# Patient Record
Sex: Female | Born: 1950 | Race: Black or African American | Hispanic: No | Marital: Married | Smoking: Never smoker
Health system: Southern US, Community
[De-identification: ages and names within clinical notes are randomized; demographics above are authoritative.]

## PROBLEM LIST (undated history)

## (undated) DIAGNOSIS — E785 Hyperlipidemia, unspecified: Secondary | ICD-10-CM

## (undated) DIAGNOSIS — Z9119 Patient's noncompliance with other medical treatment and regimen: Secondary | ICD-10-CM

## (undated) DIAGNOSIS — Z91199 Patient's noncompliance with other medical treatment and regimen due to unspecified reason: Secondary | ICD-10-CM

## (undated) DIAGNOSIS — I10 Essential (primary) hypertension: Secondary | ICD-10-CM

## (undated) DIAGNOSIS — B029 Zoster without complications: Secondary | ICD-10-CM

## (undated) HISTORY — PX: OTHER SURGICAL HISTORY: SHX169

## (undated) HISTORY — DX: Patient's noncompliance with other medical treatment and regimen: Z91.19

## (undated) HISTORY — DX: Essential (primary) hypertension: I10

## (undated) HISTORY — DX: Patient's noncompliance with other medical treatment and regimen due to unspecified reason: Z91.199

## (undated) HISTORY — DX: Hyperlipidemia, unspecified: E78.5

---

## 1998-02-02 ENCOUNTER — Ambulatory Visit (HOSPITAL_COMMUNITY): Admission: RE | Admit: 1998-02-02 | Discharge: 1998-02-02 | Payer: Self-pay | Admitting: Family Medicine

## 1999-07-25 ENCOUNTER — Encounter: Payer: Self-pay | Admitting: Family Medicine

## 1999-07-25 ENCOUNTER — Encounter: Admission: RE | Admit: 1999-07-25 | Discharge: 1999-07-25 | Payer: Self-pay | Admitting: Family Medicine

## 2000-07-27 ENCOUNTER — Encounter: Payer: Self-pay | Admitting: Internal Medicine

## 2000-07-27 ENCOUNTER — Encounter: Admission: RE | Admit: 2000-07-27 | Discharge: 2000-07-27 | Payer: Self-pay | Admitting: Internal Medicine

## 2000-08-03 ENCOUNTER — Other Ambulatory Visit: Admission: RE | Admit: 2000-08-03 | Discharge: 2000-08-03 | Payer: Self-pay | Admitting: Internal Medicine

## 2000-09-11 ENCOUNTER — Encounter: Payer: Self-pay | Admitting: Obstetrics and Gynecology

## 2000-09-14 ENCOUNTER — Encounter (INDEPENDENT_AMBULATORY_CARE_PROVIDER_SITE_OTHER): Payer: Self-pay

## 2000-09-14 ENCOUNTER — Ambulatory Visit (HOSPITAL_COMMUNITY): Admission: RE | Admit: 2000-09-14 | Discharge: 2000-09-14 | Payer: Self-pay | Admitting: Obstetrics and Gynecology

## 2001-09-14 ENCOUNTER — Encounter: Admission: RE | Admit: 2001-09-14 | Discharge: 2001-12-13 | Payer: Self-pay | Admitting: Internal Medicine

## 2002-12-07 ENCOUNTER — Other Ambulatory Visit: Admission: RE | Admit: 2002-12-07 | Discharge: 2002-12-07 | Payer: Self-pay | Admitting: Internal Medicine

## 2003-02-21 ENCOUNTER — Encounter: Admission: RE | Admit: 2003-02-21 | Discharge: 2003-02-21 | Payer: Self-pay | Admitting: Internal Medicine

## 2003-02-21 ENCOUNTER — Encounter (INDEPENDENT_AMBULATORY_CARE_PROVIDER_SITE_OTHER): Payer: Self-pay | Admitting: *Deleted

## 2003-11-30 ENCOUNTER — Ambulatory Visit: Payer: Self-pay | Admitting: Internal Medicine

## 2003-12-06 ENCOUNTER — Ambulatory Visit: Payer: Self-pay | Admitting: Internal Medicine

## 2004-01-11 ENCOUNTER — Ambulatory Visit: Payer: Self-pay | Admitting: Internal Medicine

## 2004-07-18 ENCOUNTER — Ambulatory Visit: Payer: Self-pay | Admitting: Internal Medicine

## 2004-12-20 ENCOUNTER — Ambulatory Visit: Payer: Self-pay | Admitting: Internal Medicine

## 2004-12-30 ENCOUNTER — Other Ambulatory Visit: Admission: RE | Admit: 2004-12-30 | Discharge: 2004-12-30 | Payer: Self-pay | Admitting: Internal Medicine

## 2004-12-30 ENCOUNTER — Ambulatory Visit: Payer: Self-pay | Admitting: Internal Medicine

## 2005-03-31 ENCOUNTER — Ambulatory Visit: Payer: Self-pay | Admitting: Internal Medicine

## 2005-06-18 ENCOUNTER — Ambulatory Visit: Payer: Self-pay | Admitting: Internal Medicine

## 2005-06-25 ENCOUNTER — Ambulatory Visit: Payer: Self-pay | Admitting: Internal Medicine

## 2005-08-06 ENCOUNTER — Ambulatory Visit: Payer: Self-pay | Admitting: Internal Medicine

## 2005-10-01 ENCOUNTER — Ambulatory Visit: Payer: Self-pay | Admitting: Internal Medicine

## 2005-11-13 ENCOUNTER — Ambulatory Visit: Payer: Self-pay | Admitting: Internal Medicine

## 2005-11-13 LAB — CONVERTED CEMR LAB
ALT: 36 units/L (ref 0–40)
AST: 31 units/L (ref 0–37)
Albumin: 3.5 g/dL (ref 3.5–5.2)
Alkaline Phosphatase: 90 units/L (ref 39–117)
Bilirubin, Direct: 0.1 mg/dL (ref 0.0–0.3)
Hgb A1c MFr Bld: 7.3 % — ABNORMAL HIGH (ref 4.6–6.0)
Total Bilirubin: 0.7 mg/dL (ref 0.3–1.2)
Total Protein: 6.9 g/dL (ref 6.0–8.3)

## 2006-02-12 ENCOUNTER — Ambulatory Visit: Payer: Self-pay | Admitting: Internal Medicine

## 2006-02-12 LAB — CONVERTED CEMR LAB
BUN: 18 mg/dL (ref 6–23)
Cholesterol: 196 mg/dL (ref 0–200)
Creatinine, Ser: 1 mg/dL (ref 0.4–1.2)
HDL: 33.9 mg/dL — ABNORMAL LOW (ref >39.0)
Hgb A1c MFr Bld: 7.6 % — ABNORMAL HIGH (ref 4.6–6.0)
LDL Cholesterol: 147 mg/dL — ABNORMAL HIGH (ref 0–99)
Potassium: 4 meq/L (ref 3.5–5.1)
Sodium: 137 meq/L (ref 135–145)
TSH: 0.99 microintl units/mL (ref 0.35–5.50)
Total CHOL/HDL Ratio: 5.8
Triglycerides: 78 mg/dL (ref 0–149)
VLDL: 16 mg/dL (ref 0–40)

## 2006-04-16 ENCOUNTER — Ambulatory Visit: Payer: Self-pay | Admitting: Internal Medicine

## 2006-06-19 ENCOUNTER — Ambulatory Visit: Payer: Self-pay | Admitting: Internal Medicine

## 2006-06-19 LAB — CONVERTED CEMR LAB
Cholesterol: 179 mg/dL (ref 0–200)
HDL: 37 mg/dL — ABNORMAL LOW (ref >39.0)
VLDL: 14 mg/dL (ref 0–40)

## 2006-07-01 ENCOUNTER — Ambulatory Visit: Payer: Self-pay | Admitting: Internal Medicine

## 2006-09-01 DIAGNOSIS — I10 Essential (primary) hypertension: Secondary | ICD-10-CM | POA: Insufficient documentation

## 2006-09-04 ENCOUNTER — Ambulatory Visit: Payer: Self-pay | Admitting: Internal Medicine

## 2006-09-04 DIAGNOSIS — E1169 Type 2 diabetes mellitus with other specified complication: Secondary | ICD-10-CM

## 2006-09-04 DIAGNOSIS — E785 Hyperlipidemia, unspecified: Secondary | ICD-10-CM | POA: Insufficient documentation

## 2006-09-04 DIAGNOSIS — E1165 Type 2 diabetes mellitus with hyperglycemia: Secondary | ICD-10-CM

## 2006-11-20 ENCOUNTER — Ambulatory Visit: Payer: Self-pay | Admitting: Internal Medicine

## 2006-12-11 ENCOUNTER — Telehealth: Payer: Self-pay | Admitting: Internal Medicine

## 2007-01-19 ENCOUNTER — Ambulatory Visit: Payer: Self-pay | Admitting: Internal Medicine

## 2007-01-19 LAB — CONVERTED CEMR LAB: Hgb A1c MFr Bld: 7.4 % — ABNORMAL HIGH (ref 4.6–6.0)

## 2007-02-18 ENCOUNTER — Telehealth: Payer: Self-pay | Admitting: Internal Medicine

## 2007-05-12 ENCOUNTER — Ambulatory Visit: Payer: Self-pay | Admitting: Internal Medicine

## 2007-07-05 ENCOUNTER — Ambulatory Visit: Payer: Self-pay | Admitting: Internal Medicine

## 2007-07-05 LAB — CONVERTED CEMR LAB: Hgb A1c MFr Bld: 10.2 % — ABNORMAL HIGH (ref 4.6–6.0)

## 2007-09-13 ENCOUNTER — Ambulatory Visit: Payer: Self-pay | Admitting: Internal Medicine

## 2007-11-15 ENCOUNTER — Ambulatory Visit: Payer: Self-pay | Admitting: Internal Medicine

## 2008-01-31 ENCOUNTER — Ambulatory Visit: Payer: Self-pay | Admitting: Internal Medicine

## 2008-01-31 ENCOUNTER — Telehealth: Payer: Self-pay | Admitting: Internal Medicine

## 2008-01-31 LAB — CONVERTED CEMR LAB
Chloride: 97 meq/L (ref 96–112)
Creatinine, Ser: 0.9 mg/dL (ref 0.4–1.2)
Creatinine,U: 62.3 mg/dL
GFR calc non Af Amer: 68 mL/min
Hgb A1c MFr Bld: 10.8 % — ABNORMAL HIGH (ref 4.6–6.0)

## 2008-04-24 ENCOUNTER — Ambulatory Visit: Payer: Self-pay | Admitting: Internal Medicine

## 2008-04-24 DIAGNOSIS — IMO0002 Reserved for concepts with insufficient information to code with codable children: Secondary | ICD-10-CM

## 2008-04-24 LAB — CONVERTED CEMR LAB
BUN: 18 mg/dL (ref 6–23)
CO2: 30 meq/L (ref 19–32)
Chloride: 101 meq/L (ref 96–112)
Creatinine, Ser: 1.2 mg/dL (ref 0.4–1.2)

## 2008-07-04 ENCOUNTER — Ambulatory Visit: Payer: Self-pay | Admitting: Internal Medicine

## 2008-09-07 ENCOUNTER — Telehealth (INDEPENDENT_AMBULATORY_CARE_PROVIDER_SITE_OTHER): Payer: Self-pay | Admitting: *Deleted

## 2008-09-12 ENCOUNTER — Ambulatory Visit: Payer: Self-pay | Admitting: Internal Medicine

## 2008-09-12 LAB — CONVERTED CEMR LAB
BUN: 21 mg/dL (ref 6–23)
Basophils Absolute: 0 10*3/uL (ref 0.0–0.1)
Calcium: 9.6 mg/dL (ref 8.4–10.5)
Creatinine, Ser: 1 mg/dL (ref 0.4–1.2)
Eosinophils Relative: 3.3 % (ref 0.0–5.0)
GFR calc non Af Amer: 72.85 mL/min (ref >60)
Glucose, Bld: 275 mg/dL — ABNORMAL HIGH (ref 70–99)
Hemoglobin: 12.8 g/dL (ref 12.0–15.0)
Lymphocytes Relative: 17.2 % (ref 12.0–46.0)
Lymphs Abs: 1.5 10*3/uL (ref 0.7–4.0)
MCV: 90.6 fL (ref 78.0–100.0)
Monocytes Relative: 5.4 % (ref 3.0–12.0)
Neutrophils Relative %: 73.7 % (ref 43.0–77.0)
Platelets: 286 10*3/uL (ref 150.0–400.0)
RBC: 4.2 M/uL (ref 3.87–5.11)
RDW: 13 % (ref 11.5–14.6)
Sodium: 137 meq/L (ref 135–145)

## 2008-10-09 ENCOUNTER — Telehealth: Payer: Self-pay | Admitting: Internal Medicine

## 2008-12-12 ENCOUNTER — Ambulatory Visit: Payer: Self-pay | Admitting: Internal Medicine

## 2009-02-27 ENCOUNTER — Ambulatory Visit: Payer: Self-pay | Admitting: Internal Medicine

## 2009-03-22 ENCOUNTER — Telehealth: Payer: Self-pay | Admitting: Internal Medicine

## 2009-05-16 ENCOUNTER — Ambulatory Visit: Payer: Self-pay | Admitting: Internal Medicine

## 2009-08-22 ENCOUNTER — Ambulatory Visit: Payer: Self-pay | Admitting: Internal Medicine

## 2009-12-31 ENCOUNTER — Ambulatory Visit: Payer: Self-pay | Admitting: Internal Medicine

## 2009-12-31 DIAGNOSIS — F3289 Other specified depressive episodes: Secondary | ICD-10-CM | POA: Insufficient documentation

## 2010-02-19 NOTE — Progress Notes (Signed)
Summary: Pt req samples of the new insulin or req script to [facility]  Phone Note Call from Patient Call back at [facility] Phone [phone]   Caller: Patient Complaint: Urinary/GYN Problems Summary of Call: Pt called and is req samples of the new insulin that Dr. Lovell Sheehan just prescribed or call in script to [facility]  [address] and [address].  Initial call taken by: Lucy Antigua,  March 22, 2009 11:55 AM  Follow-up for Phone Call        left message on machine 1 vial in [place]- pt to ask for debbie Follow-up by: Willy Eddy, LPN,  March 22, 2009 12:18 PM

## 2010-02-19 NOTE — Assessment & Plan Note (Signed)
Summary: 3 month fup//ccm   Vital Signs:  Patient profile:   60 year old female Height:      66 inches Weight:      172 pounds BMI:     27.86 Temp:     98.2 degrees F oral Pulse rate:   84 / minute Resp:     14 per minute BP sitting:   164 / 94  (left arm)  Vitals Entered By: Willy Eddy, LPN (August 22, 2009 11:38 AM) CC: roa- an d states s he ios taking meds as ordered, Hypertension Management Is Patient Diabetic? Yes Did you bring your meter with you today? No   CC:  roa- an d states s he ios taking meds as ordered and Hypertension Management.  History of Present Illness: Eating salt containg food with her grandkids such as chips!  Hypertension History:      She complains of dyspnea with exertion, but denies headache, chest pain, palpitations, orthopnea, PND, peripheral edema, visual symptoms, neurologic problems, syncope, and side effects from treatment.  mild.        Positive major cardiovascular risk factors include female age 85 years old or older, diabetes, hyperlipidemia, and hypertension.  Negative major cardiovascular risk factors include negative family history for ischemic heart disease and non-tobacco-user status.     Preventive Screening-Counseling & Management  Alcohol-Tobacco     Smoking Status: never     Passive Smoke Exposure: no  Problems Prior to Update: 1)  Cellulitis and Abscess of Upper Arm and Forearm  (ICD-682.3) 2)  Thoracic/lumbosacral Neuritis/radiculitis Unspec  (ICD-724.4) 3)  Hyperlipidemia  (ICD-272.4) 4)  Dm W/manifestation Nec, Type II, Uncontrolled  (ICD-250.82) 5)  Hypertension  (ICD-401.9) 6)  Family History Diabetes 1st Degree Relative  (ICD-V18.0)  Current Problems (verified): 1)  Cellulitis and Abscess of Upper Arm and Forearm  (ICD-682.3) 2)  Thoracic/lumbosacral Neuritis/radiculitis Unspec  (ICD-724.4) 3)  Hyperlipidemia  (ICD-272.4) 4)  Dm W/manifestation Nec, Type II, Uncontrolled  (ICD-250.82) 5)  Hypertension   (ICD-401.9) 6)  Family History Diabetes 1st Degree Relative  (ICD-V18.0)  Medications Prior to Update: 1)  Lantus Solostar 100 Unit/ml Soln (Insulin Glargine) .... 40 Units Once Daily 2)  Janumet 50-500 Mg Tabs (Sitagliptin-Metformin Hcl) .... One By Mouth Two Times A Day 3)  Mupirocin 2 % Oint (Mupirocin) .... Apply To Rash Bid 4)  Azor 5-40 Mg Tabs (Amlodipine-Olmesartan)  Current Medications (verified): 1)  Lantus Solostar 100 Unit/ml Soln (Insulin Glargine) .... 40 Units Once Daily 2)  Janumet 50-500 Mg Tabs (Sitagliptin-Metformin Hcl) .... One By Mouth Two Times A Day 3)  Mupirocin 2 % Oint (Mupirocin) .... Apply To Rash Bid 4)  Azor 5-40 Mg Tabs (Amlodipine-Olmesartan)  Allergies (verified): 1)  ! Codeine  Past History:  Family History: Last updated: 09/01/2006 Family History Diabetes 1st degree relative Family History Hypertension Family History Other cancer-Breast,colon  Social History: Last updated: 09/01/2006 Never Smoked Alcohol use-yes Drug use-no  Risk Factors: Exercise: yes (01/19/2007)  Risk Factors: Smoking Status: never (08/22/2009) Passive Smoke Exposure: no (08/22/2009)  Past medical, surgical, family and social histories (including risk factors) reviewed, and no changes noted (except as noted below).  Past Medical History: Reviewed history from 09/13/2007 and no changes required. Hypertension Diabetes mellitus, type II Hyperlipidemia medical noncompliance  Past Surgical History: Reviewed history from 09/01/2006 and no changes required. G4 P4  Family History: Reviewed history from 09/01/2006 and no changes required. Family History Diabetes 1st degree relative Family History Hypertension Family History Other  cancer-Breast,colon  Social History: Reviewed history from 09/01/2006 and no changes required. Never Smoked Alcohol use-yes Drug use-no  Review of Systems  The patient denies anorexia, fever, weight loss, weight gain, vision  loss, decreased hearing, hoarseness, chest pain, syncope, dyspnea on exertion, peripheral edema, prolonged cough, headaches, hemoptysis, abdominal pain, melena, hematochezia, severe indigestion/heartburn, hematuria, incontinence, genital sores, muscle weakness, suspicious skin lesions, transient blindness, difficulty walking, depression, unusual weight change, abnormal bleeding, enlarged lymph nodes, angioedema, and breast masses.    Physical Exam  General:  overweight-appearing.  alert.   Head:  atraumatic, no abnormalities observed, and diffuse alopecia.   Eyes:  pupils equal and pupils round.   Neck:  No deformities, masses, or tenderness noted. Lungs:  Normal respiratory effort, chest expands symmetrically. Lungs are clear to auscultation, no crackles or wheezes. Heart:  Normal rate and regular rhythm. S1 and S2 normal without gallop, murmur, click, rub or other extra sounds. Abdomen:  soft and non-tender.     Impression & Recommendations:  Problem # 1:  DM W/MANIFESTATION NEC, TYPE II, UNCONTROLLED (ICD-250.82) Assessment Deteriorated  Her updated medication list for this problem includes:    Lantus Solostar 100 Unit/ml Soln (Insulin glargine) .Marland KitchenMarland KitchenMarland KitchenMarland Kitchen 40 units once daily    Janumet 50-500 Mg Tabs (Sitagliptin-metformin hcl) ..... One by mouth two times a day    Azor 5-40 Mg Tabs (Amlodipine-olmesartan)  Labs Reviewed: Creat: 1.0 (09/12/2008)    Reviewed HgBA1c results: 12.3 (09/12/2008)  11.3 (04/24/2008)  Problem # 2:  HYPERTENSION (ICD-401.9) Assessment: Deteriorated  pt needs diet counsilling and salt restrictions Her updated medication list for this problem includes:    Azor 5-40 Mg Tabs (Amlodipine-olmesartan)  BP today: 164/94 Prior BP: 142/88 (05/16/2009)  10 Yr Risk Heart Disease: > 32 % Prior 10 Yr Risk Heart Disease: 27 % (04/24/2008)  Labs Reviewed: K+: 4.3 (09/12/2008) Creat: : 1.0 (09/12/2008)   Chol: 229 (09/12/2008)   HDL: 35.70 (09/12/2008)   LDL: 128  (06/19/2006)   TG: 70 (06/19/2006)  Complete Medication List: 1)  Lantus Solostar 100 Unit/ml Soln (Insulin glargine) .... 40 units once daily 2)  Janumet 50-500 Mg Tabs (Sitagliptin-metformin hcl) .... One by mouth two times a day 3)  Mupirocin 2 % Oint (Mupirocin) .... Apply to rash bid 4)  Azor 5-40 Mg Tabs (Amlodipine-olmesartan)  Hypertension Assessment/Plan:      The patient's hypertensive risk group is category C: Target organ damage and/or diabetes.  Her calculated 10 year risk of coronary heart disease is > 32 %.  Today's blood pressure is 164/94.  Her blood pressure goal is < 130/80.  Patient Instructions: 1)  Please schedule a follow-up appointment in 4 months.

## 2010-02-19 NOTE — Assessment & Plan Note (Signed)
Summary: 2 month rov/njr---PT [facility] // RS   Vital Signs:  Patient profile:   60 year old female Height:      66 inches Weight:      170 pounds BMI:     27.54 Temp:     98.2 degrees F oral Pulse rate:   84 / minute Resp:     14 per minute BP sitting:   154 / 84  (left arm)  Vitals Entered By: Willy Eddy, LPN (February 27, 2009 2:21 PM) CC: roa Is Patient Diabetic? Yes Did you bring your meter with you today? No Pain Assessment Patient in pain? no        CC:  roa.  History of Present Illness: we need to consider cost in finding  insulin for this pt to control her sugars will try to get the pt to understand this changes by carefully listing her medications we will change to the 4 $ generics for orals and change to the 70/30 nph to control the blood sugars the last a1c was 12! and the pt has not been able to be compliant with the lantus due to costs  Preventive Screening-Counseling & Management  Alcohol-Tobacco     Smoking Status: never     Passive Smoke Exposure: no  Problems Prior to Update: 1)  Cellulitis and Abscess of Upper Arm and Forearm  (ICD-682.3) 2)  Thoracic/lumbosacral Neuritis/radiculitis Unspec  (ICD-724.4) 3)  Hyperlipidemia  (ICD-272.4) 4)  Dm W/manifestation Nec, Type II, Uncontrolled  (ICD-250.82) 5)  Hypertension  (ICD-401.9) 6)  Family History Diabetes 1st Degree Relative  (ICD-V18.0)  Current Problems (verified): 1)  Cellulitis and Abscess of Upper Arm and Forearm  (ICD-682.3) 2)  Thoracic/lumbosacral Neuritis/radiculitis Unspec  (ICD-724.4) 3)  Hyperlipidemia  (ICD-272.4) 4)  Dm W/manifestation Nec, Type II, Uncontrolled  (ICD-250.82) 5)  Hypertension  (ICD-401.9) 6)  Family History Diabetes 1st Degree Relative  (ICD-V18.0)  Medications Prior to Update: 1)  Lantus 100 Unit/ml  Soln (Insulin Glargine) .... 45 Units Per Day 2)  Janumet 50-500 Mg Tabs (Sitagliptin-Metformin Hcl) .... One By Mouth Two Times A Day 3)  Keflex 500 Mg Caps  (Cephalexin) .... One Tablet Three Times A Day 4)  Mupirocin 2 % Oint (Mupirocin) .... Apply To Rash Bid 5)  Diflucan 150 Mg Tabs (Fluconazole) .... Take One By Mouth Now and Again in 5 Days 6)  Azor 5-40 Mg Tabs (Amlodipine-Olmesartan)  Current Medications (verified): 1)  Novolog Mix 70/30 70-30 % Susp (Insulin Aspart Prot & Aspart) .... 20 Units At Breakfast and 20 Units At  Dinner  ( Twice A Day) 2)  Janumet 50-500 Mg Tabs (Sitagliptin-Metformin Hcl) .... One By Mouth Two Times A Day 3)  Mupirocin 2 % Oint (Mupirocin) .... Apply To Rash Bid 4)  Azor 5-40 Mg Tabs (Amlodipine-Olmesartan)  Allergies (verified): 1)  ! Codeine  Past History:  Family History: Last updated: 09/01/2006 Family History Diabetes 1st degree relative Family History Hypertension Family History Other cancer-Breast,colon  Social History: Last updated: 09/01/2006 Never Smoked Alcohol use-yes Drug use-no  Risk Factors: Exercise: yes (01/19/2007)  Risk Factors: Smoking Status: never (02/27/2009) Passive Smoke Exposure: no (02/27/2009)  Past medical, surgical, family and social histories (including risk factors) reviewed, and no changes noted (except as noted below).  Past Medical History: Reviewed history from 09/13/2007 and no changes required. Hypertension Diabetes mellitus, type II Hyperlipidemia medical noncompliance  Past Surgical History: Reviewed history from 09/01/2006 and no changes required. G4 P4  Family History: Reviewed  history from 09/01/2006 and no changes required. Family History Diabetes 1st degree relative Family History Hypertension Family History Other cancer-Breast,colon  Social History: Reviewed history from 09/01/2006 and no changes required. Never Smoked Alcohol use-yes Drug use-no  Review of Systems  The patient denies anorexia, fever, weight loss, weight gain, vision loss, decreased hearing, hoarseness, chest pain, syncope, dyspnea on exertion, peripheral edema,  prolonged cough, headaches, hemoptysis, abdominal pain, melena, hematochezia, severe indigestion/heartburn, hematuria, incontinence, genital sores, muscle weakness, suspicious skin lesions, transient blindness, difficulty walking, depression, unusual weight change, abnormal bleeding, enlarged lymph nodes, angioedema, and breast masses.    Physical Exam  General:  overweight-appearing.  alert.   Head:  atraumatic, no abnormalities observed, and diffuse alopecia.   Eyes:  pupils equal and pupils round.   Nose:  nose piercing noted and no external erythema.   Neck:  No deformities, masses, or tenderness noted. Lungs:  Normal respiratory effort, chest expands symmetrically. Lungs are clear to auscultation, no crackles or wheezes. Heart:  Normal rate and regular rhythm. S1 and S2 normal without gallop, murmur, click, rub or other extra sounds. Abdomen:  soft and non-tender.   Msk:  skin under left arm with pustular leson and erythema Extremities:  No clubbing, cyanosis, edema, or deformity noted with normal full range of motion of all joints.   Neurologic:  alert & oriented X3 and gait normal.     Impression & Recommendations:  Problem # 1:  DM W/MANIFESTATION NEC, TYPE II, UNCONTROLLED (ICD-250.82) Assessment Unchanged very complicated pt due to her learning ability and to her financial situation we have simplifried the insulin to 20 units 70/30 two times a day and will moniter for 2 months if she can get her a1c down we wil create a protocol for the 70/30 we have asked her daugter to come to the next office visit as a second set of eyes and ears Her updated medication list for this problem includes:    Novolog Mix 70/30 70-30 % Susp (Insulin aspart prot & aspart) .Marland Kitchen... 20 units at breakfast and 20 units at  dinner  ( twice a day)    Janumet 50-500 Mg Tabs (Sitagliptin-metformin hcl) ..... One by mouth two times a day    Azor 5-40 Mg Tabs (Amlodipine-olmesartan)  Labs Reviewed: Creat: 1.0  (09/12/2008)    Reviewed HgBA1c results: 12.3 (09/12/2008)  11.3 (04/24/2008)  Problem # 2:  HYPERTENSION (ICD-401.9) Assessment: Unchanged stable Her updated medication list for this problem includes:    Azor 5-40 Mg Tabs (Amlodipine-olmesartan)  BP today: 154/84 Prior BP: 150/90 (12/12/2008)  Prior 10 Yr Risk Heart Disease: 27 % (04/24/2008)  Labs Reviewed: K+: 4.3 (09/12/2008) Creat: : 1.0 (09/12/2008)   Chol: 229 (09/12/2008)   HDL: 35.70 (09/12/2008)   LDL: 128 (06/19/2006)   TG: 70 (06/19/2006)  Complete Medication List: 1)  Novolog Mix 70/30 70-30 % Susp (Insulin aspart prot & aspart) .... 20 units at breakfast and 20 units at  dinner  ( twice a day) 2)  Janumet 50-500 Mg Tabs (Sitagliptin-metformin hcl) .... One by mouth two times a day 3)  Mupirocin 2 % Oint (Mupirocin) .... Apply to rash bid 4)  Azor 5-40 Mg Tabs (Amlodipine-olmesartan)  Patient Instructions: 1)  Please schedule a follow-up appointment in 2 months. 2)  have your daughter come to the next OV

## 2010-02-19 NOTE — Assessment & Plan Note (Signed)
Summary: 2 month rov/njr----PT [facility] // RS   Vital Signs:  Patient profile:   60 year old female Height:      66 inches Weight:      178 pounds BMI:     28.83 Temp:     98.2 degrees F oral Pulse rate:   80 / minute Resp:     14 per minute BP sitting:   142 / 88  (left arm)  Vitals Entered By: Willy Eddy, LPN (May 16, 2009 10:29 AM) CC: roa- states she ran out of novolog and was given lantus and has been taking lantus 40 units qd   CC:  roa- states she ran out of novolog and was given lantus and has been taking lantus 40 units qd.  History of Present Illness: the pt was on novolog due to cost and gained weigth and stated that she wanted to eat all the time she cannot afford her medicaitons and neds help with understanding her options for care we have tried to give her sufficisnt samples but event then I am concerned that she "holds back" worried about running out  Allergies: 1)  ! Codeine  Review of Systems  The patient denies anorexia, fever, weight loss, weight gain, vision loss, decreased hearing, hoarseness, chest pain, syncope, dyspnea on exertion, peripheral edema, prolonged cough, headaches, hemoptysis, abdominal pain, melena, hematochezia, severe indigestion/heartburn, hematuria, incontinence, genital sores, muscle weakness, suspicious skin lesions, transient blindness, difficulty walking, depression, unusual weight change, abnormal bleeding, enlarged lymph nodes, angioedema, and breast masses.    Physical Exam  General:  overweight-appearing.  alert.   Head:  atraumatic, no abnormalities observed, and diffuse alopecia.   Neck:  No deformities, masses, or tenderness noted. Lungs:  Normal respiratory effort, chest expands symmetrically. Lungs are clear to auscultation, no crackles or wheezes. Heart:  Normal rate and regular rhythm. S1 and S2 normal without gallop, murmur, click, rub or other extra sounds.   Impression & Recommendations:  Problem # 1:  DM  W/MANIFESTATION NEC, TYPE II, UNCONTROLLED (ICD-250.82) the pt has been out of her lantus and this is represented in the markedly elevated  access  Her updated medication list for this problem includes:    Lantus Solostar 100 Unit/ml Soln (Insulin glargine) .Marland KitchenMarland KitchenMarland KitchenMarland Kitchen 40 units once daily    Janumet 50-500 Mg Tabs (Sitagliptin-metformin hcl) ..... One by mouth two times a day    Azor 5-40 Mg Tabs (Amlodipine-olmesartan)  Labs Reviewed: Creat: 1.0 (09/12/2008)    Reviewed HgBA1c results: 12.3 (09/12/2008)  11.3 (04/24/2008)  Problem # 2:  HYPERTENSION (ICD-401.9)  Her updated medication list for this problem includes:    Azor 5-40 Mg Tabs (Amlodipine-olmesartan)  BP today: 142/88 Prior BP: 154/84 (02/27/2009)  Prior 10 Yr Risk Heart Disease: 27 % (04/24/2008)  Labs Reviewed: K+: 4.3 (09/12/2008) Creat: : 1.0 (09/12/2008)   Chol: 229 (09/12/2008)   HDL: 35.70 (09/12/2008)   LDL: 128 (06/19/2006)   TG: 70 (06/19/2006)  Problem # 3:  Preventive Health Care (ICD-V70.0)  she needs to be inrolled in a pulbic progream but [facility] resistant [facility] but has [facility]  Complete Medication List: 1)  Lantus Solostar 100 Unit/ml Soln (Insulin glargine) .... 40 units once daily 2)  Janumet 50-500 Mg Tabs (Sitagliptin-metformin hcl) .... One by mouth two times a day 3)  Mupirocin 2 % Oint (Mupirocin) .... Apply to rash bid 4)  Azor 5-40 Mg Tabs (Amlodipine-olmesartan)  Patient Instructions: 1)  call social services and discuss medicaid  for you 2)  Please schedule a follow-up appointment in 3 months. 3)  take medications evely day including the lantus shots

## 2010-02-21 NOTE — Assessment & Plan Note (Signed)
Summary: 4 month rov/njr   Vital Signs:  Patient profile:   60 year old female Height:      66 inches Weight:      172 pounds BMI:     27.86 Temp:     98.2 degrees F oral Pulse rate:   80 / minute Resp:     14 per minute BP sitting:   160 / 90  (left arm)  Vitals Entered By: Willy Eddy, LPN (December 31, 2009 12:09 PM) CC: roa- when asked if she had been taking meds,she stated she doesnt always have it and she takes what she has Is Patient Diabetic? Yes   Primary Care Kathryn Cosby:  Stacie Glaze MD  CC:  roa- when asked if she had been taking meds and she stated she doesnt always have it and she takes what she has.  History of Present Illness: The pt has been missing her insulin doses she did not pick up her DM meds she feels like she is ready to go.... 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 40 Units Once Daily 2)   Janumet 50-500 Mg Tabs (Sitagliptin-Metformin Hcl) .... One By Mouth Two Times A Day 3)  Mupirocin 2 % Oint (Mupirocin) .... Apply To Rash Bid 4)  Azor 5-40 Mg Tabs (Amlodipine-Olmesartan)  Current Medications (verified): 1)  Lantus Solostar 100 Unit/ml Soln (Insulin Glargine) .... 40 Units Once Daily 2)  Janumet 50-500 Mg Tabs (Sitagliptin-Metformin Hcl) .... One By Mouth Two Times A Day 3)  Mupirocin 2 % Oint (Mupirocin) .... Apply To Rash Bid 4)  Azor 5-40 Mg Tabs (Amlodipine-Olmesartan) 5)  Cymbalta 30 Mg Cpep (Duloxetine Hcl) .... One By Mouth Daily in The Morning  Allergies (verified): 1)  ! Codeine  Past History:  Family History: Last updated: 09/01/2006 Family History Diabetes 1st degree relative Family History Hypertension Family History Other cancer-Breast,colon  Social History: Last updated: 09/01/2006 Never Smoked Alcohol use-yes Drug use-no  Risk Factors: Exercise: yes (01/19/2007)  Risk Factors: Smoking Status: never (12/31/2009) Passive Smoke Exposure: no (12/31/2009)  Past medical, surgical, family and social histories (including risk factors) reviewed, and no changes noted (except as noted below).  Past Medical History: Reviewed history from 09/13/2007 and no changes required. Hypertension Diabetes mellitus, type II Hyperlipidemia medical noncompliance  Past Surgical History: Reviewed history from 09/01/2006 and no changes required. G4 P4  Family History: Reviewed history from 09/01/2006 and no changes required. Family History Diabetes 1st degree relative Family History Hypertension Family History Other cancer-Breast,colon  Social History: Reviewed history  from 09/01/2006 and no changes required. Never Smoked Alcohol use-yes Drug use-no  Review of Systems       The patient complains of depression.  The patient denies anorexia, fever, weight loss, weight gain, vision loss, decreased hearing, hoarseness, chest pain, syncope, dyspnea on  exertion, peripheral edema, prolonged cough, headaches, hemoptysis, abdominal pain, melena, hematochezia, severe indigestion/heartburn, hematuria, incontinence, genital sores, muscle weakness, suspicious skin lesions, transient blindness, difficulty walking, unusual weight change, abnormal bleeding, enlarged lymph nodes, angioedema, and breast masses.    Physical Exam  General:  overweight-appearing.  alert.   Head:  atraumatic, no abnormalities observed, and diffuse alopecia.   Eyes:  pupils equal and pupils round.   Neck:  No deformities, masses, or tenderness noted. Lungs:  Normal respiratory effort, chest expands symmetrically. Lungs are clear to auscultation, no crackles or wheezes. Heart:  Normal rate and regular rhythm. S1 and S2 normal without gallop, murmur, click, rub or other extra sounds.  Diabetes Management Exam:    Foot Exam (with socks and/or shoes not present):       Sensory-Monofilament:          Left foot: diminished          Right foot: diminished       Inspection:          Left foot: normal          Right foot: normal   Impression & Recommendations:  Problem # 1:  DM W/MANIFESTATION NEC, TYPE II, UNCONTROLLED (ICD-250.82) Assessment Unchanged non compliance with medications, forgetting dose very depressed Her updated medication list for this problem includes:    Lantus Solostar 100 Unit/ml Soln (Insulin glargine) .Marland KitchenMarland KitchenMarland KitchenMarland Kitchen 40 units once daily    Janumet 50-500 Mg Tabs (Sitagliptin-metformin hcl) ..... One by mouth two times a day    Azor 5-40 Mg Tabs (Amlodipine-olmesartan)  Labs Reviewed: Creat: 1.0 (09/12/2008)    Reviewed HgBA1c results: 12.3 (09/12/2008)  11.3 (04/24/2008)  Problem # 2:  ATYPICAL DEPRESSIVE DISORDER (ICD-296.82) Assessment: Unchanged with non compliance with medications very presure speech samples of cymbalta and [facility]  Problem # 3:  HYPERTENSION (ICD-401.9)  Her updated medication list for this problem  includes:    Azor 5-40 Mg Tabs (Amlodipine-olmesartan)  BP today: 160/90 Prior BP: 164/94 (08/22/2009)  Prior 10 Yr Risk Heart Disease: > 32 % (08/22/2009)  Labs Reviewed: K+: 4.3 (09/12/2008) Creat: : 1.0 (09/12/2008)   Chol: 229 (09/12/2008)   HDL: 35.70 (09/12/2008)   LDL: 128 (06/19/2006)   TG: 70 (06/19/2006)  Complete Medication List: 1)  Lantus Solostar 100 Unit/ml Soln (Insulin glargine) .... 40 units once daily 2)  Janumet 50-500 Mg Tabs (Sitagliptin-metformin hcl) .... One by mouth two times a day 3)  Mupirocin 2 % Oint (Mupirocin) .... Apply to rash bid 4)  Azor 5-40 Mg Tabs (Amlodipine-olmesartan) 5)  Cymbalta 30 Mg Cpep (Duloxetine hcl) .... One by mouth daily in the morning  Patient Instructions: 1)  Please schedule a follow-up appointment in 3 months.   Orders Added: 1)  Est. Patient Level II [38101]

## 2010-04-01 ENCOUNTER — Encounter: Payer: Self-pay | Admitting: Internal Medicine

## 2010-04-01 ENCOUNTER — Ambulatory Visit: Payer: Self-pay | Admitting: Internal Medicine

## 2010-04-01 DIAGNOSIS — I10 Essential (primary) hypertension: Secondary | ICD-10-CM

## 2010-04-01 DIAGNOSIS — E1165 Type 2 diabetes mellitus with hyperglycemia: Secondary | ICD-10-CM

## 2010-04-01 DIAGNOSIS — E1169 Type 2 diabetes mellitus with other specified complication: Secondary | ICD-10-CM

## 2010-04-01 DIAGNOSIS — IMO0002 Reserved for concepts with insufficient information to code with codable children: Secondary | ICD-10-CM

## 2010-04-01 LAB — BASIC METABOLIC PANEL
BUN: 16 mg/dL (ref 6–23)
CO2: 26 mEq/L (ref 19–32)
Calcium: 9.1 mg/dL (ref 8.4–10.5)
Glucose, Bld: 360 mg/dL — ABNORMAL HIGH (ref 70–99)
Sodium: 131 mEq/L — ABNORMAL LOW (ref 135–145)

## 2010-04-01 NOTE — Patient Instructions (Signed)
samples of your Lantus and her oral medications were given to her. we're going to try to room help you to enroll in a drug assistance program through the pharmaceutical companies but still encourage you to consider [facility]

## 2010-04-01 NOTE — Progress Notes (Signed)
  Subjective:    Patient ID: [name], female    DOB: [birth year], 60 y.o.   MRN: [id number]  HPI The pt has been out of insulin and has been off medications She  lacks income and insurance and will not go to [facility]. We will try to get her enrolled in a program for lantus She does not monitor her CBG's     Review of Systems  Constitutional: Negative for activity change, appetite change and fatigue.  HENT: Negative for ear pain, congestion, neck pain, postnasal drip and sinus pressure.   Eyes: Negative for redness and visual disturbance.  Respiratory: Negative for cough, shortness of breath and wheezing.   Gastrointestinal: Negative for abdominal pain and abdominal distention.  Genitourinary: Negative for dysuria, frequency and menstrual problem.  Musculoskeletal: Negative for myalgias, joint swelling and arthralgias.  Skin: Negative for rash and wound.  Neurological: Negative for dizziness, weakness and headaches.  Hematological: Negative for adenopathy. Does not bruise/bleed easily.  Psychiatric/Behavioral: Negative for sleep disturbance and decreased concentration.       Objective:   Physical Exam  Constitutional: She is oriented to person, place, and time. She appears well-developed and well-nourished. No distress.  HENT:  Head: Normocephalic and atraumatic.  Right Ear: External ear normal.  Left Ear: External ear normal.  Nose: Nose normal.  Mouth/Throat: Oropharynx is clear and moist.  Eyes: Conjunctivae and EOM are normal. Pupils are equal, round, and reactive to light.  Neck: Normal range of motion. Neck supple. No JVD present. No tracheal deviation present. No thyromegaly present.  Cardiovascular: Normal rate, regular rhythm, normal heart sounds and intact distal pulses.   No murmur heard. Pulmonary/Chest: Effort normal and breath sounds normal. She has no wheezes. She exhibits no tenderness.  Abdominal: Soft. Bowel sounds are normal.  Musculoskeletal:  Normal range of motion. She exhibits no edema and no tenderness.  Lymphadenopathy:    She has no cervical adenopathy.  Neurological: She is alert and oriented to person, place, and time. She has normal reflexes. No cranial nerve deficit.  Skin: Skin is warm and dry. She is not diaphoretic.  Psychiatric: She has a normal mood and affect. Her behavior is normal.          Assessment & Plan:

## 2010-04-09 ENCOUNTER — Telehealth: Payer: Self-pay | Admitting: *Deleted

## 2010-04-09 DIAGNOSIS — B373 Candidiasis of vulva and vagina: Secondary | ICD-10-CM

## 2010-04-09 NOTE — Telephone Encounter (Signed)
C/o vaginal yeast infection requesting medication and also feels nauseated--[facility] [address]

## 2010-04-10 MED ORDER — FLUCONAZOLE 100 MG PO TABS: 100.0000 mg | ORAL_TABLET | Freq: Every day | ORAL | Status: DC

## 2010-04-10 NOTE — Telephone Encounter (Signed)
Pt.notified

## 2010-04-10 NOTE — Telephone Encounter (Signed)
call pt 7 days of diflucan sent in

## 2010-04-17 ENCOUNTER — Telehealth: Payer: Self-pay | Admitting: *Deleted

## 2010-04-17 NOTE — Telephone Encounter (Signed)
ERROR

## 2010-04-18 ENCOUNTER — Telehealth: Payer: Self-pay | Admitting: *Deleted

## 2010-04-18 DIAGNOSIS — B373 Candidiasis of vulva and vagina: Secondary | ICD-10-CM

## 2010-04-18 MED ORDER — FLUCONAZOLE 100 MG PO TABS: 100.0000 mg | ORAL_TABLET | Freq: Every day | ORAL | Status: AC

## 2010-04-18 NOTE — Telephone Encounter (Signed)
Infection still not better, also wants refills on fluconazole, her insulin, and janumet called into [facility]

## 2010-04-19 NOTE — Telephone Encounter (Signed)
Ok to refill per dr Lovell Sheehan and sent in- Left message on machine for pt that  Samples are here and have address for finanacial aid that might could help her

## 2010-05-16 ENCOUNTER — Other Ambulatory Visit: Payer: Self-pay | Admitting: *Deleted

## 2010-05-16 MED ORDER — SITAGLIPTIN PHOS-METFORMIN HCL 50-500 MG PO TABS: 1.0000 | ORAL_TABLET | Freq: Two times a day (BID) | ORAL | Status: DC

## 2010-05-16 MED ORDER — INSULIN GLARGINE 100 UNIT/ML ~~LOC~~ SOLN: 40.0000 [IU] | Freq: Every day | SUBCUTANEOUS | Status: DC

## 2010-05-16 MED ORDER — AMLODIPINE-OLMESARTAN 5-40 MG PO TABS: 1.0000 | ORAL_TABLET | Freq: Every day | ORAL | Status: DC

## 2010-05-16 MED ORDER — ATORVASTATIN CALCIUM 20 MG PO TABS: 20.0000 mg | ORAL_TABLET | Freq: Every day | ORAL | Status: DC

## 2010-06-03 ENCOUNTER — Ambulatory Visit (INDEPENDENT_AMBULATORY_CARE_PROVIDER_SITE_OTHER): Payer: Self-pay | Admitting: Internal Medicine

## 2010-06-03 ENCOUNTER — Encounter: Payer: Self-pay | Admitting: Internal Medicine

## 2010-06-03 VITALS — BP 140/80 | HR 76 | Temp 98.5°F | Resp 16 | Ht 67.0 in | Wt 170.0 lb

## 2010-06-03 DIAGNOSIS — I10 Essential (primary) hypertension: Secondary | ICD-10-CM

## 2010-06-03 DIAGNOSIS — E1165 Type 2 diabetes mellitus with hyperglycemia: Secondary | ICD-10-CM

## 2010-06-03 DIAGNOSIS — E785 Hyperlipidemia, unspecified: Secondary | ICD-10-CM

## 2010-06-03 DIAGNOSIS — E1169 Type 2 diabetes mellitus with other specified complication: Secondary | ICD-10-CM

## 2010-06-03 DIAGNOSIS — Z9119 Patient's noncompliance with other medical treatment and regimen: Secondary | ICD-10-CM

## 2010-06-03 LAB — LIPID PANEL
Cholesterol: 288 mg/dL — ABNORMAL HIGH (ref 0–200)
HDL: 56.5 mg/dL (ref >39.00)
Triglycerides: 129 mg/dL (ref 0.0–149.0)
VLDL: 25.8 mg/dL (ref 0.0–40.0)

## 2010-06-03 LAB — BASIC METABOLIC PANEL
Chloride: 101 mEq/L (ref 96–112)
GFR: 76.85 mL/min (ref >60.00)
Potassium: 4 mEq/L (ref 3.5–5.1)
Sodium: 134 mEq/L — ABNORMAL LOW (ref 135–145)

## 2010-06-03 LAB — HEMOGLOBIN A1C: Hgb A1c MFr Bld: 13.6 % — ABNORMAL HIGH (ref 4.6–6.5)

## 2010-06-03 NOTE — Progress Notes (Signed)
  Subjective:    Patient ID: [name], female    DOB: [birth year], 60 y.o.   MRN: [id number]  HPI The pt has been on, HTN  DM and lipid  Medication The pt has neuropthy The pt has noted some "falling out of bed" she cannot recall if she was dizzy or light headed. She just fell out of bed. She has been diabetic for over 10 years   Review of Systems  Constitutional: Positive for activity change, appetite change and fatigue.  HENT: Negative for ear pain, congestion, neck pain, postnasal drip and sinus pressure.   Eyes: Negative for redness and visual disturbance.  Respiratory: Negative for cough, shortness of breath and wheezing.   Gastrointestinal: Negative for abdominal pain and abdominal distention.  Genitourinary: Negative for dysuria, frequency and menstrual problem.  Musculoskeletal: Negative for myalgias, joint swelling and arthralgias.  Skin: Negative for rash and wound.  Neurological: Positive for weakness and light-headedness. Negative for dizziness and headaches.  Hematological: Negative for adenopathy. Does not bruise/bleed easily.  Psychiatric/Behavioral: Negative for sleep disturbance and decreased concentration.       Objective:   Physical Exam  Nursing note and vitals reviewed. HENT:  Head: Normocephalic and atraumatic.  Eyes: Conjunctivae are normal. Pupils are equal, round, and reactive to light.  Cardiovascular: Normal rate and regular rhythm.   Pulmonary/Chest: Effort normal and breath sounds normal.          Assessment & Plan:   This patient is a noncompliant diabetic who cannot afford her medications we are going to link her with medication assistance program since he does not qualify for Medicaid or Medicare at this point she will need to be with a limited income assistance program so that she can continue her insulin as well as her medications for blood pressure I am afraid that if she does not get in such a program that she will have a significant  complication from her diabetes as we cannot supply her with all the samples that she needs to be compliant

## 2010-06-03 NOTE — Patient Instructions (Signed)
Last A1c prior to getting help with her medications was 17. A repeat A1c will be drawn today She is due a mammogram it has been over 5 years She is due a Pap smear again it has been over 5 years Please show this to the medical aid  directors over at [facility] to see if you can get help with these needed examinations.  He has just been on any medicines now for a couple of months and it will take about 6 months to stabilize some of the symptoms that you are feeling are coming from your blood sugars coming down be sure you eat regularly as diabetics must eat on a regular basis and not skip meals

## 2010-06-07 NOTE — Op Note (Signed)
[facility]  Patient:    [name], [name] Visit Number: [id number] MRN: [id number]          Service Type: DSU Location: DAY Attending Physician:  Osborn Coho Proc. Date: 09/14/00 Adm. Date:  09/14/2000   CC:         Stacie Glaze, M.D. [facility]   Operative Report  PREOPERATIVE DIAGNOSIS:  Postmenopausal bleeding.  POSTOPERATIVE DIAGNOSIS:  Postmenopausal bleeding.  PROCEDURES: 1. Hysteroscopy. 2. Dilation and curettage.  SURGEON:  Mark E. Dareen Piano, M.D.  ANESTHESIA:  General.  ANTIBIOTICS:  Ancef 1 g.  ESTIMATED BLOOD LOSS:  Minimal.  SPECIMENS:  Endocervical and endometrial curettings sent to pathology.  COMPLICATIONS:  None.  DESCRIPTION OF PROCEDURE:  The patient was taken to the operating room where a general anesthetic was administered without complications.  She was then placed in the dorsal lithotomy position and prepped with Betadine.  She was draped in the usual fashion for this procedure.  A sterile speculum was placed in the vagina.  A single-tooth tenaculum applied to the anterior cervical lip. The cervical os was then serially dilated to a 24 [place].  The hysteroscope was advanced through the endocervical canal which appeared to be normal.  On entering the uterine cavity, both ostia were visualized.  There was no evidence of any polyps or fibroids.  At this point, the hysteroscope was removed.  The patient then had endocervical curettage followed by endometrial curettage.  Specimens were sent to pathology.  The patient tolerated the procedure well.  She was sent to the recovery room in stable condition.  The patient will be discharged to home.  She will follow up in the office in two weeks. Attending Physician:  Osborn Coho DD:  09/14/00 TD:  09/15/00 Job: [id number]

## 2010-07-10 ENCOUNTER — Other Ambulatory Visit: Payer: Self-pay | Admitting: *Deleted

## 2010-07-10 MED ORDER — DULOXETINE HCL 30 MG PO CPEP: 30.0000 mg | ORAL_CAPSULE | Freq: Every day | ORAL | Status: DC

## 2010-09-02 ENCOUNTER — Ambulatory Visit (INDEPENDENT_AMBULATORY_CARE_PROVIDER_SITE_OTHER): Payer: Self-pay | Admitting: Internal Medicine

## 2010-09-02 ENCOUNTER — Encounter: Payer: Self-pay | Admitting: Internal Medicine

## 2010-09-02 VITALS — BP 130/80 | HR 72 | Temp 98.2°F | Resp 16 | Ht 68.0 in | Wt 172.0 lb

## 2010-09-02 DIAGNOSIS — E1169 Type 2 diabetes mellitus with other specified complication: Secondary | ICD-10-CM

## 2010-09-02 DIAGNOSIS — E1165 Type 2 diabetes mellitus with hyperglycemia: Secondary | ICD-10-CM

## 2010-09-02 LAB — BASIC METABOLIC PANEL
BUN: 27 mg/dL — ABNORMAL HIGH (ref 6–23)
Calcium: 9.3 mg/dL (ref 8.4–10.5)
Creatinine, Ser: 1.2 mg/dL (ref 0.4–1.2)
GFR: 56.46 mL/min — ABNORMAL LOW (ref >60.00)
Glucose, Bld: 183 mg/dL — ABNORMAL HIGH (ref 70–99)
Potassium: 4.1 mEq/L (ref 3.5–5.1)

## 2010-09-02 NOTE — Progress Notes (Signed)
  Subjective:    Patient ID: [name], female    DOB: [birth year], 60 y.o.   MRN: [id number]  HPI    Review of Systems     Objective:   Physical Exam        Assessment & Plan:   Subjective:     [name] is a 60 y.o. female who presents for follow up of diabetes.. Current symptoms include: hyperglycemia. Patient denies foot ulcerations and hypoglycemia . Evaluation to date has been: fasting blood sugar and hemoglobin A1C. Home sugars: BGs consistently in an acceptable range. Current treatments: more intensive attention to diet which has been somewhat effective. Last dilated eye exam not done.  The following portions of the patient's history were reviewed and updated as appropriate: allergies, current medications, past family history, past medical history, past social history, past surgical history and problem list.  Review of Systems Integument/breast: positive for rash and skin color change Endocrine: positive for diabetic symptoms including increased fatigue    Objective:    BP 130/80  Pulse 72  Temp 98.2 F (36.8 C)  Resp 16  Ht 5\' 8"  (1.727 m)  Wt 172 lb (78.019 kg)  BMI 26.15 kg/m2  General Appearance:    Alert, cooperative, no distress, appears stated age  Head:    Normocephalic, without obvious abnormality, atraumatic  Eyes:    PERRL, conjunctiva/corneas clear, EOM's intact, fundi    benign, both eyes  Ears:    Normal TM's and external ear canals, both ears  Nose:   Nares normal, septum midline, mucosa normal, no drainage    or sinus tenderness  Throat:   Lips, mucosa, and tongue normal; teeth and gums normal  Neck:   Supple, symmetrical, trachea midline, no adenopathy;    thyroid:  no enlargement/tenderness/nodules; no carotid   bruit or JVD  Back:     Symmetric, no curvature, ROM normal, no CVA tenderness  Lungs:     Clear to auscultation bilaterally, respirations unlabored  Chest Wall:    No tenderness or deformity   Heart:    Regular rate  and rhythm, S1 and S2 normal, no murmur, rub   or gallop  Breast Exam:    No tenderness, masses, or nipple abnormality  Abdomen:     Soft, non-tender, bowel sounds active all four quadrants,    no masses, no organomegaly  Genitalia:    Normal female without lesion, discharge or tenderness  Rectal:    Normal tone, normal prostate, no masses or tenderness;   guaiac negative stool  Extremities:   Extremities normal, atraumatic, no cyanosis or edema  Pulses:   2+ and symmetric all extremities  Skin:   Skin color, texture, turgor normal, no rashes or lesions  Lymph nodes:   Cervical, supraclavicular, and axillary nodes normal  Neurologic:   CNII-XII intact, normal strength, sensation and reflexes    throughout      @DMFOOTEXAM @  Patient was evaluated for proper footwear and sizing.  Laboratory: No components found with this basename: A1C      Assessment:    Diabetes mellitus Type II, under fair control.    Plan:    Discussed general issues about diabetes pathophysiology and management. Agricultural engineer distributed. Encouraged aerobic exercise. Discussed foot care. Reminded to get yearly retinal exam.

## 2010-11-07 ENCOUNTER — Other Ambulatory Visit: Payer: Self-pay | Admitting: Obstetrics and Gynecology

## 2010-11-07 ENCOUNTER — Other Ambulatory Visit: Payer: Self-pay | Admitting: *Deleted

## 2010-11-07 DIAGNOSIS — Z1231 Encounter for screening mammogram for malignant neoplasm of breast: Secondary | ICD-10-CM

## 2010-11-12 ENCOUNTER — Other Ambulatory Visit: Payer: Self-pay | Admitting: Obstetrics and Gynecology

## 2010-11-12 ENCOUNTER — Encounter (HOSPITAL_COMMUNITY): Payer: Self-pay

## 2010-11-12 ENCOUNTER — Ambulatory Visit (HOSPITAL_COMMUNITY): Admission: RE | Admit: 2010-11-12 | Payer: Self-pay | Source: Ambulatory Visit

## 2010-11-12 ENCOUNTER — Ambulatory Visit (INDEPENDENT_AMBULATORY_CARE_PROVIDER_SITE_OTHER): Payer: Self-pay | Admitting: *Deleted

## 2010-11-12 VITALS — BP 188/99 | HR 92 | Temp 98.0°F | Ht 66.0 in | Wt 176.9 lb

## 2010-11-12 DIAGNOSIS — N63 Unspecified lump in unspecified breast: Secondary | ICD-10-CM

## 2010-11-12 DIAGNOSIS — Z01419 Encounter for gynecological examination (general) (routine) without abnormal findings: Secondary | ICD-10-CM

## 2010-11-12 DIAGNOSIS — N6310 Unspecified lump in the right breast, unspecified quadrant: Secondary | ICD-10-CM

## 2010-11-12 DIAGNOSIS — N6320 Unspecified lump in the left breast, unspecified quadrant: Secondary | ICD-10-CM

## 2010-11-12 NOTE — Progress Notes (Signed)
Complaints of pain in both breasts.   Pap Smear:    Pap smear completed today. Last Pap smear was 12/30/04 and normal. Report in EPIC.  Per patient no history of abnormal Pap smears.   Physical exam: Breasts      Breasts symmetrical. No skin abnormalities and no nipple discharge observed. No nipple retraction bilateral breasts. No lymphadenopathy. Small lump palpated in right breast at 2 o'clock around 2 cm below incision scar from a previous removal of benign lump. Patient complained of tenderness on palpation. Firm lump palpated at left outer quadrant of breast around 3 o'clock. Patient complained of tenderness on palpation. Patient complained of bilateral breast pain prior to exam rated a 10 out of 10. Patient has been referred to the [facility] for Diagnostic Mammogram and ultrasound. Patient's appointment is Friday, November 22, 2010 at 1400.      Pelvic/Bimanual   Ext Genitalia No lesions, No swelling and No discharge observed on external genitalia.           Vagina Vagina pink and normal texture. No lesions or discharge observed.          Cervix Cervix present. Cervix pink and normal texture. Cervix positioned to the right. Cervix friable and small amount of spotting from Pap smear.       Uterus Uterus present and palpable. Uterus positioned to right and enlarged.          Adnexae Ovaries present and not palpable. No tenderness on palpation.       Rectovaginal   No rectal exam completed today. No rectal complaints. No skin abnormalities observed on rectal area.

## 2010-11-12 NOTE — Patient Instructions (Signed)
Taught patient how to perform BSE and gave educational materials. Referred patient to the [facility] for a diagnostic mammogram and ultrasound. Scheduled patients follow up appointment at the [facility] for Friday, November 2nd at 1400. If there are any cancellations the Breast Center will call patient to schedule follow up sooner. Let patient know that I will call her back with results. Patient verbalized understanding and stated she would be at follow up appointment.

## 2010-11-22 ENCOUNTER — Ambulatory Visit
Admission: RE | Admit: 2010-11-22 | Discharge: 2010-11-22 | Disposition: A | Discharge status: Home / Self Care | Payer: No Typology Code available for payment source | Source: Ambulatory Visit | Attending: Obstetrics and Gynecology | Admitting: Obstetrics and Gynecology

## 2010-11-22 DIAGNOSIS — N63 Unspecified lump in unspecified breast: Secondary | ICD-10-CM

## 2010-11-29 ENCOUNTER — Telehealth: Payer: Self-pay

## 2010-11-29 NOTE — Telephone Encounter (Signed)
Called pt and informed pt of her normal pap results and that her mammogram was normal and to continue getting yearly mammograms.  Pt stated understanding.

## 2010-12-02 ENCOUNTER — Ambulatory Visit (INDEPENDENT_AMBULATORY_CARE_PROVIDER_SITE_OTHER): Payer: No Typology Code available for payment source | Admitting: Internal Medicine

## 2010-12-02 ENCOUNTER — Encounter: Payer: Self-pay | Admitting: Internal Medicine

## 2010-12-02 DIAGNOSIS — I10 Essential (primary) hypertension: Secondary | ICD-10-CM

## 2010-12-02 DIAGNOSIS — R609 Edema, unspecified: Secondary | ICD-10-CM

## 2010-12-02 DIAGNOSIS — Z23 Encounter for immunization: Secondary | ICD-10-CM

## 2010-12-02 DIAGNOSIS — E1165 Type 2 diabetes mellitus with hyperglycemia: Secondary | ICD-10-CM

## 2010-12-02 DIAGNOSIS — E1169 Type 2 diabetes mellitus with other specified complication: Secondary | ICD-10-CM

## 2010-12-02 DIAGNOSIS — IMO0002 Reserved for concepts with insufficient information to code with codable children: Secondary | ICD-10-CM

## 2010-12-02 MED ORDER — FUROSEMIDE 20 MG PO TABS: 20.0000 mg | ORAL_TABLET | Freq: Every day | ORAL | Status: DC

## 2010-12-02 NOTE — Progress Notes (Signed)
  Subjective:    Patient ID: [name], female    DOB: Nov 04, 1950, 60 y.o.   MRN: [id number]  HPI  Patient is a 60 year old African American female presents for followup of hypertension and diabetes.  She has been on medicines assistant program she states her diabetes is much better controlled and her CBGs are in the 120-140 range.  She has increased swelling in her lower extremities is noticed increased pigment. She does have a family history of amputation due to diabetes  Review of Systems  Constitutional: Negative for activity change, appetite change and fatigue.  HENT: Negative for ear pain, congestion, neck pain, postnasal drip and sinus pressure.   Eyes: Negative for redness and visual disturbance.  Respiratory: Negative for cough, shortness of breath and wheezing.   Gastrointestinal: Negative for abdominal pain and abdominal distention.  Genitourinary: Negative for dysuria, frequency and menstrual problem.  Musculoskeletal: Negative for myalgias, joint swelling and arthralgias.  Skin: Negative for rash and wound.  Neurological: Negative for dizziness, weakness and headaches.  Hematological: Negative for adenopathy. Does not bruise/bleed easily.  Psychiatric/Behavioral: Negative for sleep disturbance and decreased concentration.       Objective:   Physical Exam  Constitutional: She is oriented to person, place, and time. She appears well-developed and well-nourished. No distress.  HENT:  Head: Normocephalic and atraumatic.  Right Ear: External ear normal.  Left Ear: External ear normal.  Nose: Nose normal.  Mouth/Throat: Oropharynx is clear and moist.  Eyes: Conjunctivae and EOM are normal. Pupils are equal, round, and reactive to light.  Neck: Normal range of motion. Neck supple. No JVD present. No tracheal deviation present. No thyromegaly present.  Cardiovascular: Normal rate, regular rhythm, normal heart sounds and intact distal pulses.   No murmur  heard. Pulmonary/Chest: Effort normal and breath sounds normal. She has no wheezes. She exhibits no tenderness.  Abdominal: Soft. Bowel sounds are normal.  Musculoskeletal: Normal range of motion. She exhibits no edema and no tenderness.  Lymphadenopathy:    She has no cervical adenopathy.  Neurological: She is alert and oriented to person, place, and time. She has normal reflexes. No cranial nerve deficit.  Skin: Skin is warm and dry. She is not diaphoretic.  Psychiatric: She has a normal mood and affect. Her behavior is normal.          Assessment & Plan:   elevated blood pressure with lower extremity edema a basic metabolic panel and a hemoglobin A1cadraw a basic metabolic panel and hemoglobin W0J innd hyperpigmentation will add Lasix 20 mg by mouth daily and monitor her blood pressure samples or a sore given to aid compliance.  She will continue all other medications as prior to this office visit and presented to my to which time we will

## 2010-12-02 NOTE — Patient Instructions (Signed)
The patient is instructed to continue all medications as prescribed. Schedule followup with check out clerk upon leaving the clinic  

## 2010-12-03 ENCOUNTER — Encounter: Payer: Self-pay | Admitting: Obstetrics and Gynecology

## 2010-12-31 ENCOUNTER — Other Ambulatory Visit: Payer: Self-pay | Admitting: *Deleted

## 2010-12-31 MED ORDER — SITAGLIPTIN PHOS-METFORMIN HCL 50-500 MG PO TABS: 1.0000 | ORAL_TABLET | Freq: Two times a day (BID) | ORAL | Status: DC

## 2011-03-03 ENCOUNTER — Ambulatory Visit (INDEPENDENT_AMBULATORY_CARE_PROVIDER_SITE_OTHER): Payer: No Typology Code available for payment source | Admitting: Internal Medicine

## 2011-03-03 ENCOUNTER — Encounter: Payer: Self-pay | Admitting: Internal Medicine

## 2011-03-03 VITALS — BP 140/84 | HR 76 | Temp 98.2°F | Resp 16 | Ht 66.0 in | Wt 183.0 lb

## 2011-03-03 DIAGNOSIS — E1165 Type 2 diabetes mellitus with hyperglycemia: Secondary | ICD-10-CM

## 2011-03-03 DIAGNOSIS — E1169 Type 2 diabetes mellitus with other specified complication: Secondary | ICD-10-CM

## 2011-03-03 DIAGNOSIS — B029 Zoster without complications: Secondary | ICD-10-CM

## 2011-03-03 DIAGNOSIS — I10 Essential (primary) hypertension: Secondary | ICD-10-CM

## 2011-03-03 LAB — BASIC METABOLIC PANEL
BUN: 16 mg/dL (ref 6–23)
CO2: 28 mEq/L (ref 19–32)
GFR: 60.88 mL/min (ref >60.00)
Glucose, Bld: 71 mg/dL (ref 70–99)
Potassium: 3.9 mEq/L (ref 3.5–5.1)
Sodium: 138 mEq/L (ref 135–145)

## 2011-03-03 MED ORDER — ACYCLOVIR 800 MG PO TABS: 800.0000 mg | ORAL_TABLET | Freq: Three times a day (TID) | ORAL | Status: AC

## 2011-03-03 MED ORDER — OLMESARTAN-AMLODIPINE-HCTZ 40-10-25 MG PO TABS: 1.0000 | ORAL_TABLET | Freq: Every day | ORAL | Status: DC

## 2011-03-03 NOTE — Progress Notes (Signed)
Subjective:    Patient ID: [name], female    DOB: [birth year], 61 y.o.   MRN: [id number]  HPI Patient is a 61 year old African American female is followed for diabetes and hypertension.  She has a long history of noncompliance with medications recently she ran out of her needles and has not been doing her injectable insulin.  She has been on an antihypertensive medication. Azor. She states that she has been compliant with the medication her blood pressure when I checked it was 180/100 She notes a painful rash on her right shoulder   Review of Systems  Constitutional: Negative for activity change, appetite change and fatigue.  HENT: Negative for ear pain, congestion, neck pain, postnasal drip and sinus pressure.   Eyes: Negative for redness and visual disturbance.  Respiratory: Negative for cough, shortness of breath and wheezing.   Gastrointestinal: Negative for abdominal pain and abdominal distention.  Genitourinary: Negative for dysuria, frequency and menstrual problem.  Musculoskeletal: Negative for myalgias, joint swelling and arthralgias.  Skin: Negative for rash and wound.  Neurological: Negative for dizziness, weakness and headaches.  Hematological: Negative for adenopathy. Does not bruise/bleed easily.  Psychiatric/Behavioral: Negative for sleep disturbance and decreased concentration.   Past Medical History  Diagnosis Date  . Hypertension   . Diabetes mellitus   . Hyperlipidemia   . History of noncompliance with medical treatment     History   Social History  . Marital Status: Married    Spouse Name: N/A    Number of Children: N/A  . Years of Education: N/A   Occupational History  . Not on file.   Social History Main Topics  . Smoking status: Never Smoker   . Smokeless tobacco: Never Used  . Alcohol Use: No  . Drug Use: No  . Sexually Active: Not Currently   Other Topics Concern  . Not on file   Social History Narrative  . No narrative on  file    Past Surgical History  Procedure Date  . Diabetes   . Fibroids of breast     Family History  Problem Relation Age of Onset  . Diabetes    . Hypertension    . Hypertension Father   . Diabetes Father   . Hypertension Mother   . Diabetes Mother   . Diabetes Sister   . Hypertension Sister   . Diabetes Sister   . Hypertension Sister     Allergies  Allergen Reactions  . Codeine     REACTION: Nausea Vomiting    Current Outpatient Prescriptions on File Prior to Visit  Medication Sig Dispense Refill  . insulin glargine (LANTUS) 100 UNIT/ML injection Inject 40 Units into the skin at bedtime.  10 mL  12  . sitaGLIPtan-metformin (JANUMET) 50-500 MG per tablet Take 1 tablet by mouth 2 (two) times daily with a meal.  180 tablet  3    BP 140/84  Pulse 76  Temp 98.2 F (36.8 C)  Resp 16  Ht 5\' 6"  (1.676 m)  Wt 183 lb (83.008 kg)  BMI 29.54 kg/m2        Objective:   Physical Exam  Constitutional: She is oriented to person, place, and time. She appears well-developed and well-nourished. No distress.  HENT:  Head: Normocephalic and atraumatic.  Right Ear: External ear normal.  Left Ear: External ear normal.  Nose: Nose normal.  Mouth/Throat: Oropharynx is clear and moist.  Eyes: Conjunctivae and EOM are normal. Pupils are equal, round, and reactive to  light.  Neck: Normal range of motion. Neck supple. No JVD present. No tracheal deviation present. No thyromegaly present.  Cardiovascular: Normal rate, regular rhythm, normal heart sounds and intact distal pulses.   No murmur heard. Pulmonary/Chest: Effort normal and breath sounds normal. She has no wheezes. She exhibits no tenderness.  Abdominal: Soft. Bowel sounds are normal.  Musculoskeletal: Normal range of motion. She exhibits no edema and no tenderness.  Lymphadenopathy:    She has no cervical adenopathy.  Neurological: She is alert and oriented to person, place, and time. She has normal reflexes. No cranial  nerve deficit.  Skin: Rash noted. She is not diaphoretic.       Tenderness over the right shoulder  Psychiatric: She has a normal mood and affect. Her behavior is normal.          Assessment & Plan:  Monitor hemoglobin A1c expected to be elevated due to her noncompliance at this time.  Change her blood pressure medicine from Azor to [employer]. 3 months of samples was given to a compliant and we will see if we can get this medication to her aid program she is however about to go on Medicare which will assist her significantly and getting her medications  She has what appears to be an acute shingles attack on her right shoulder we will treat with acyclovir 800 mg 3 times a day for 10 days

## 2011-03-03 NOTE — Patient Instructions (Signed)
The patient is instructed to continue all medications as prescribed. Schedule followup with check out clerk upon leaving the clinic  

## 2011-05-05 ENCOUNTER — Telehealth: Payer: Self-pay | Admitting: Internal Medicine

## 2011-05-05 ENCOUNTER — Other Ambulatory Visit: Payer: Self-pay | Admitting: *Deleted

## 2011-05-05 MED ORDER — SITAGLIPTIN PHOS-METFORMIN HCL 50-500 MG PO TABS: 1.0000 | ORAL_TABLET | Freq: Two times a day (BID) | ORAL | Status: DC

## 2011-05-05 NOTE — Telephone Encounter (Signed)
Pt called req refill of sitaGLIPtan-metformin (JANUMET) 50-500 MG per tablet to [facility] on [address]. To last until her appt date with Dr Lovell Sheehan in May. Pt said that if samples are avail, let her know.

## 2011-05-05 NOTE — Telephone Encounter (Signed)
Sent to pharmacy and pt informed 

## 2011-06-02 ENCOUNTER — Encounter: Payer: Self-pay | Admitting: Internal Medicine

## 2011-06-02 ENCOUNTER — Ambulatory Visit (INDEPENDENT_AMBULATORY_CARE_PROVIDER_SITE_OTHER): Payer: Self-pay | Admitting: Internal Medicine

## 2011-06-02 VITALS — BP 170/100 | HR 88 | Temp 98.2°F | Resp 16 | Ht 66.0 in | Wt 176.0 lb

## 2011-06-02 DIAGNOSIS — I10 Essential (primary) hypertension: Secondary | ICD-10-CM

## 2011-06-02 DIAGNOSIS — K047 Periapical abscess without sinus: Secondary | ICD-10-CM

## 2011-06-02 DIAGNOSIS — E1165 Type 2 diabetes mellitus with hyperglycemia: Secondary | ICD-10-CM

## 2011-06-02 DIAGNOSIS — E1169 Type 2 diabetes mellitus with other specified complication: Secondary | ICD-10-CM

## 2011-06-02 LAB — BASIC METABOLIC PANEL
BUN: 24 mg/dL — ABNORMAL HIGH (ref 6–23)
CO2: 25 mEq/L (ref 19–32)
Chloride: 98 mEq/L (ref 96–112)
Glucose, Bld: 108 mg/dL — ABNORMAL HIGH (ref 70–99)
Potassium: 4 mEq/L (ref 3.5–5.1)
Sodium: 136 mEq/L (ref 135–145)

## 2011-06-02 MED ORDER — PENICILLIN V POTASSIUM 500 MG PO TABS: 500.0000 mg | ORAL_TABLET | Freq: Four times a day (QID) | ORAL | Status: AC

## 2011-06-02 MED ORDER — PENICILLIN V POTASSIUM 500 MG PO TABS: 500.0000 mg | ORAL_TABLET | Freq: Four times a day (QID) | ORAL | Status: DC

## 2011-06-02 NOTE — Progress Notes (Signed)
  Subjective:    Patient ID: [name], female    DOB: 01/15/1951, 61 y.o.   MRN: [id number]  HPI Patient is a brittle diabetic with a history of poor control with meds over the weekend she had several celebrations including Mother's Day celebration" 8 to much" her blood pressure is up she states her blood sugars have been increased recently.  The last 2 A1c  stable in the 8 range with a history of A1c's as high as 11-13 This has been aided by the fact that she gets her medications through a state program.     Review of Systems  Constitutional: Negative for activity change, appetite change and fatigue.  HENT: Negative for ear pain, congestion, neck pain, postnasal drip and sinus pressure.   Eyes: Negative for redness and visual disturbance.  Respiratory: Negative for cough, shortness of breath and wheezing.   Gastrointestinal: Negative for abdominal pain and abdominal distention.  Genitourinary: Negative for dysuria, frequency and menstrual problem.  Musculoskeletal: Negative for myalgias, joint swelling and arthralgias.  Skin: Negative for rash and wound.  Neurological: Negative for dizziness, weakness and headaches.  Hematological: Negative for adenopathy. Does not bruise/bleed easily.  Psychiatric/Behavioral: Negative for sleep disturbance and decreased concentration.       Objective:   Physical Exam  Nursing note and vitals reviewed. Constitutional: She is oriented to person, place, and time. She appears well-developed and well-nourished. No distress.  HENT:  Head: Normocephalic and atraumatic.  Right Ear: External ear normal.  Left Ear: External ear normal.  Nose: Nose normal.  Mouth/Throat: Oropharynx is clear and moist.  Eyes: Conjunctivae and EOM are normal. Pupils are equal, round, and reactive to light.  Neck: Normal range of motion. Neck supple. No JVD present. No tracheal deviation present. No thyromegaly present.  Cardiovascular: Normal rate, regular rhythm,  normal heart sounds and intact distal pulses.   No murmur heard. Pulmonary/Chest: Effort normal and breath sounds normal. She has no wheezes. She exhibits no tenderness.  Abdominal: Soft. Bowel sounds are normal.  Musculoskeletal: Normal range of motion. She exhibits no edema and no tenderness.  Lymphadenopathy:    She has no cervical adenopathy.  Neurological: She is alert and oriented to person, place, and time. She has normal reflexes. No cranial nerve deficit.  Skin: Skin is warm and dry. She is not diaphoretic.  Psychiatric: She has a normal mood and affect. Her behavior is normal.          Assessment & Plan:  Reviewed medications and gave her samples for compliance discussed diet and the fact that salt crease and sugar will elevate her blood pressure as well as ruling her diabetic control.  She also has an active infection in her jaw most probably a dental abscess we'll treat with Pen-Vee K for 10 days and asked her to see a dentist as soon as possible

## 2011-06-02 NOTE — Patient Instructions (Signed)
Is better need a protein snack when you're hungry such as peanut butter on a cracker or cheese on a cracker  Sugar fuels appetite

## 2011-09-08 ENCOUNTER — Encounter: Payer: Self-pay | Admitting: Internal Medicine

## 2011-09-08 ENCOUNTER — Other Ambulatory Visit: Payer: Self-pay | Admitting: Internal Medicine

## 2011-09-08 ENCOUNTER — Ambulatory Visit (INDEPENDENT_AMBULATORY_CARE_PROVIDER_SITE_OTHER): Payer: Self-pay | Admitting: Internal Medicine

## 2011-09-08 VITALS — BP 140/84 | HR 72 | Temp 98.2°F | Resp 16 | Ht 66.0 in | Wt 174.0 lb

## 2011-09-08 DIAGNOSIS — N6310 Unspecified lump in the right breast, unspecified quadrant: Secondary | ICD-10-CM

## 2011-09-08 DIAGNOSIS — N63 Unspecified lump in unspecified breast: Secondary | ICD-10-CM

## 2011-09-08 NOTE — Progress Notes (Signed)
  Subjective:    Patient ID: [name], female    DOB: 1950/02/04, 61 y.o.   MRN: [id number]  HPI Patient is a 61 year old female who is followed for adult onset diabetes hypertension and hyperlipidemia who presents with a chief complaint today of a painful cyst in her right breast.  She states that she palpated it is tender to touch it is new she has had previous biopsies and lumpectomy in that breast for suspicious lesions.  She had mammography done several months ago which was reported to be normal but she did not have any of her symptoms at that time   Review of Systems  Constitutional: Negative for activity change, appetite change and fatigue.  HENT: Negative for ear pain, congestion, neck pain, postnasal drip and sinus pressure.   Eyes: Negative for redness and visual disturbance.  Respiratory: Negative for cough, shortness of breath and wheezing.   Gastrointestinal: Negative for abdominal pain and abdominal distention.  Genitourinary: Negative for dysuria, frequency and menstrual problem.  Musculoskeletal: Negative for myalgias, joint swelling and arthralgias.  Skin: Negative for rash and wound.  Neurological: Negative for dizziness, weakness and headaches.  Hematological: Negative for adenopathy. Does not bruise/bleed easily.  Psychiatric/Behavioral: Negative for disturbed wake/sleep cycle and decreased concentration.       Objective:   Physical Exam  Constitutional: She is oriented to person, place, and time. She appears well-developed and well-nourished. No distress.  HENT:  Head: Normocephalic and atraumatic.  Right Ear: External ear normal.  Left Ear: External ear normal.  Nose: Nose normal.  Mouth/Throat: Oropharynx is clear and moist.  Eyes: Conjunctivae and EOM are normal. Pupils are equal, round, and reactive to light.  Neck: Normal range of motion. Neck supple. No JVD present. No tracheal deviation present. No thyromegaly present.  Cardiovascular: Normal  rate, regular rhythm, normal heart sounds and intact distal pulses.   No murmur heard. Pulmonary/Chest: Effort normal and breath sounds normal. She has no wheezes. She exhibits no tenderness.       Breast examination reveals tenderness in the right breast in the area of low region and small cystlike lesions palpable to deep palpation  Abdominal: Soft. Bowel sounds are normal.  Musculoskeletal: Normal range of motion. She exhibits no edema and no tenderness.  Lymphadenopathy:    She has no cervical adenopathy.  Neurological: She is alert and oriented to person, place, and time. She has normal reflexes. No cranial nerve deficit.  Skin: Skin is warm and dry. She is not diaphoretic.  Psychiatric: She has a normal mood and affect. Her behavior is normal.          Assessment & Plan:  Potential breast masses.  Referred for breast ultrasound placed on Keflex 500 mg by mouth 4 times a day for 10 days.  His hemoglobin A1c last visit we'll repeat hemoglobin A1c next visit blood pressure is well controlled

## 2011-09-08 NOTE — Patient Instructions (Signed)
The patient is instructed to continue all medications as prescribed. Schedule followup with check out clerk upon leaving the clinic You'll receive a call from our office to schedule a ultrasound of your right breast if you have not heard from [place] in 2-3 days to please contact the office

## 2011-09-09 ENCOUNTER — Other Ambulatory Visit: Payer: Self-pay | Admitting: Internal Medicine

## 2011-09-09 ENCOUNTER — Other Ambulatory Visit: Payer: Self-pay | Admitting: *Deleted

## 2011-09-09 DIAGNOSIS — N6310 Unspecified lump in the right breast, unspecified quadrant: Secondary | ICD-10-CM

## 2011-09-09 MED ORDER — INSULIN GLARGINE 100 UNIT/ML ~~LOC~~ SOLN: 40.0000 [IU] | Freq: Every day | SUBCUTANEOUS | Status: DC

## 2011-09-12 ENCOUNTER — Telehealth: Payer: Self-pay | Admitting: *Deleted

## 2011-09-12 ENCOUNTER — Telehealth: Payer: Self-pay | Admitting: Internal Medicine

## 2011-09-12 MED ORDER — INSULIN GLARGINE 100 UNIT/ML ~~LOC~~ SOLN: 40.0000 [IU] | Freq: Every day | SUBCUTANEOUS | Status: DC

## 2011-09-12 MED ORDER — CEPHALEXIN 500 MG PO CAPS: 500.0000 mg | ORAL_CAPSULE | Freq: Four times a day (QID) | ORAL | Status: AC

## 2011-09-12 NOTE — Telephone Encounter (Signed)
Patient called stating that the [facility] never received the ok to fill her Lantus and also the MD was supposed to have called her in an abx in to [facility], [address]. Please advise.

## 2011-09-12 NOTE — Telephone Encounter (Signed)
done

## 2011-09-15 ENCOUNTER — Telehealth: Payer: Self-pay | Admitting: *Deleted

## 2011-09-15 NOTE — Telephone Encounter (Signed)
Patient called this morning and left message for me to call her back due to her physician finding a lump in her right breast. Attempted to call patient back and no one answered phone. Left message for patient to call me back.

## 2011-09-16 ENCOUNTER — Telehealth: Payer: Self-pay | Admitting: *Deleted

## 2011-09-16 NOTE — Telephone Encounter (Signed)
Patient returned my call. Let her know she will need to be seen by [facility] and then will [facility]. Scheduled patient to come to [facility] next Tuesday, September 23, 2011 at 1130. Gave patient directions to where she needs to go at [facility]. Patient verbalized understanding.

## 2011-09-23 ENCOUNTER — Ambulatory Visit (HOSPITAL_COMMUNITY)
Admission: RE | Admit: 2011-09-23 | Discharge: 2011-09-23 | Disposition: A | Discharge status: Home / Self Care | Payer: Self-pay | Source: Ambulatory Visit | Attending: Obstetrics and Gynecology | Admitting: Obstetrics and Gynecology

## 2011-09-23 ENCOUNTER — Other Ambulatory Visit: Payer: Self-pay | Admitting: Obstetrics and Gynecology

## 2011-09-23 ENCOUNTER — Encounter (HOSPITAL_COMMUNITY): Payer: Self-pay

## 2011-09-23 DIAGNOSIS — N6313 Unspecified lump in the right breast, lower outer quadrant: Secondary | ICD-10-CM

## 2011-09-23 DIAGNOSIS — N6323 Unspecified lump in the left breast, lower outer quadrant: Secondary | ICD-10-CM

## 2011-09-23 DIAGNOSIS — Z1239 Encounter for other screening for malignant neoplasm of breast: Secondary | ICD-10-CM

## 2011-09-23 DIAGNOSIS — N63 Unspecified lump in unspecified breast: Secondary | ICD-10-CM

## 2011-09-23 DIAGNOSIS — N644 Mastodynia: Secondary | ICD-10-CM

## 2011-09-24 ENCOUNTER — Telehealth: Payer: Self-pay | Admitting: Internal Medicine

## 2011-09-24 MED ORDER — FLUCONAZOLE 150 MG PO TABS: 150.0000 mg | ORAL_TABLET | Freq: Once | ORAL | Status: AC

## 2011-09-24 NOTE — Telephone Encounter (Signed)
Pt called req refill of fluconazole (DIFLUCAN) 100 MG tablet for yeast inf. [facility] on [address]

## 2011-09-24 NOTE — Telephone Encounter (Signed)
Ok per Dr Jenkins, rx sent in electronically 

## 2011-09-25 ENCOUNTER — Ambulatory Visit
Admission: RE | Admit: 2011-09-25 | Discharge: 2011-09-25 | Disposition: A | Discharge status: Home / Self Care | Payer: No Typology Code available for payment source | Source: Ambulatory Visit | Attending: Obstetrics and Gynecology | Admitting: Obstetrics and Gynecology

## 2011-09-25 ENCOUNTER — Other Ambulatory Visit: Payer: Self-pay | Admitting: Obstetrics and Gynecology

## 2011-09-25 DIAGNOSIS — N63 Unspecified lump in unspecified breast: Secondary | ICD-10-CM

## 2011-09-25 DIAGNOSIS — N644 Mastodynia: Secondary | ICD-10-CM

## 2011-10-02 ENCOUNTER — Telehealth (HOSPITAL_COMMUNITY): Payer: Self-pay | Admitting: *Deleted

## 2011-10-02 NOTE — Telephone Encounter (Signed)
Telephoned patient at home # and discussed results. Advised patient diagnostic mammogram was benign and recommendation was bilateral mammogram in one year. Patient voiced understanding.

## 2011-11-03 ENCOUNTER — Other Ambulatory Visit: Payer: Self-pay | Admitting: *Deleted

## 2011-11-03 MED ORDER — SITAGLIPTIN PHOS-METFORMIN HCL 50-500 MG PO TABS: 1.0000 | ORAL_TABLET | Freq: Two times a day (BID) | ORAL | Status: DC

## 2011-11-10 NOTE — Patient Instructions (Signed)
See detailed notes scanned under media. 

## 2011-11-10 NOTE — Progress Notes (Signed)
See detailed notes scanned under media. 

## 2011-11-12 ENCOUNTER — Other Ambulatory Visit: Payer: Self-pay | Admitting: *Deleted

## 2011-12-08 ENCOUNTER — Ambulatory Visit (INDEPENDENT_AMBULATORY_CARE_PROVIDER_SITE_OTHER): Payer: No Typology Code available for payment source | Admitting: Internal Medicine

## 2011-12-08 ENCOUNTER — Encounter: Payer: Self-pay | Admitting: Internal Medicine

## 2011-12-08 VITALS — BP 140/80 | HR 76 | Temp 98.0°F | Resp 16 | Ht 66.0 in | Wt 176.0 lb

## 2011-12-08 DIAGNOSIS — E1169 Type 2 diabetes mellitus with other specified complication: Secondary | ICD-10-CM

## 2011-12-08 DIAGNOSIS — IMO0002 Reserved for concepts with insufficient information to code with codable children: Secondary | ICD-10-CM

## 2011-12-08 NOTE — Patient Instructions (Addendum)
Medicaid office for inusrance

## 2011-12-08 NOTE — Progress Notes (Signed)
  Subjective:    Patient ID: [name], female    DOB: 06-08-1950, 61 y.o.   MRN: [id number]  HPI  Patient's upper respiratory tract infection has resolved and she is here today for followup of her diabetes and her hemoglobin A1c.  Patient's blood pressure stable her current medications  Review of Systems     Objective:   Physical Exam        Assessment & Plan:  Samples of amlodipine hydrochlorothiazide are given to the patient as well as samples of Janumet she continues to use Lantus as directed

## 2011-12-31 NOTE — Addendum Note (Signed)
Encounter addended by: Saintclair Halsted, RN on: 12/31/2011  2:28 PM<BR>     Documentation filed: Charges VN

## 2012-03-15 ENCOUNTER — Ambulatory Visit: Payer: No Typology Code available for payment source | Admitting: Internal Medicine

## 2012-03-23 ENCOUNTER — Other Ambulatory Visit: Payer: Self-pay | Admitting: *Deleted

## 2012-03-23 DIAGNOSIS — I10 Essential (primary) hypertension: Secondary | ICD-10-CM

## 2012-03-23 MED ORDER — OLMESARTAN-AMLODIPINE-HCTZ 40-10-25 MG PO TABS: 1.0000 | ORAL_TABLET | Freq: Every day | ORAL | Status: DC

## 2012-04-07 ENCOUNTER — Ambulatory Visit (INDEPENDENT_AMBULATORY_CARE_PROVIDER_SITE_OTHER): Payer: Self-pay | Admitting: Internal Medicine

## 2012-04-07 ENCOUNTER — Encounter: Payer: Self-pay | Admitting: Internal Medicine

## 2012-04-07 VITALS — BP 140/80 | HR 70 | Temp 98.1°F | Wt 189.0 lb

## 2012-04-07 DIAGNOSIS — E785 Hyperlipidemia, unspecified: Secondary | ICD-10-CM

## 2012-04-07 DIAGNOSIS — E1169 Type 2 diabetes mellitus with other specified complication: Secondary | ICD-10-CM

## 2012-04-07 DIAGNOSIS — I10 Essential (primary) hypertension: Secondary | ICD-10-CM

## 2012-04-07 DIAGNOSIS — E1165 Type 2 diabetes mellitus with hyperglycemia: Secondary | ICD-10-CM
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
## 2012-04-30 ENCOUNTER — Ambulatory Visit: Payer: Self-pay | Admitting: Internal Medicine

## 2012-05-10 ENCOUNTER — Encounter: Payer: Self-pay | Admitting: Family Medicine

## 2012-05-10 ENCOUNTER — Ambulatory Visit (INDEPENDENT_AMBULATORY_CARE_PROVIDER_SITE_OTHER): Payer: Self-pay | Admitting: Family Medicine

## 2012-05-10 ENCOUNTER — Telehealth: Payer: Self-pay | Admitting: Internal Medicine

## 2012-05-10 VITALS — BP 150/94 | Temp 100.1°F | Wt 182.0 lb

## 2012-05-10 DIAGNOSIS — J069 Acute upper respiratory infection, unspecified: Secondary | ICD-10-CM

## 2012-05-10 MED ORDER — HYDROCODONE-HOMATROPINE 5-1.5 MG/5ML PO SYRP: 5.0000 mL | ORAL_SOLUTION | Freq: Four times a day (QID) | ORAL | Status: AC | PRN

## 2012-05-10 NOTE — Telephone Encounter (Signed)
Patient Information:  Caller Name: Christon  Phone: [phone]  Patient: [name], [name]  Gender: Female  DOB: [birth year]  Age: 62 Years  PCP: Darryll Capers (Adults only)  Office Follow Up:  Does the office need to follow up with this patient?: No  Instructions For The Office: N/A  RN Note:  Pt statesthat if Zpak can not be called in today and she needs to be seen; she wants to be seen today because she can not go another night like this.  Symptoms  Reason For Call & Symptoms: Pt is calling and states that she has flu like sx and she is requesting Zpak for sx; sx started 05/05/12; sx include cough, congestion, feeling hot and cold;  vomited at 0300 today; explained that we can not call in antibiotic she will need to be seen; unsure if has a fever; no thermometer;  Reviewed Health History In EMR: Yes  Reviewed Medications In EMR: Yes  Reviewed Allergies In EMR: Yes  Reviewed Surgeries / Procedures: Yes  Date of Onset of Symptoms: 05/05/2012  Treatments Tried: resting and taking in fluids  Treatments Tried Worked: No  Guideline(s) Used:  Influenza - Seasonal  Disposition Per Guideline:   See Today or Tomorrow in Office  Reason For Disposition Reached:   Patient wants to be seen  Advice Given:  N/A  Patient Will Follow Care Advice:  YES  Appointment Scheduled:  05/10/2012 16:30:00 Appointment Scheduled Provider:  Evelena Peat (Family Practice)

## 2012-05-10 NOTE — Patient Instructions (Addendum)

## 2012-05-10 NOTE — Progress Notes (Signed)
  Subjective:    Patient ID: [name], female    DOB: [birth year], 62 y.o.   MRN: [id number]  HPI Acute visit. Onset last Wednesday of mild intermittent headache and some right earache. Subsequently last week and developed some nasal congestion and conjunctivitis symptoms. Conjunctivitis symptoms are improved at this time. Yesterday she low-grade fever and sore throat along with nonproductive cough. Increased malaise. Denies any nausea, vomiting, diarrhea, or any dysuria. Taking over-the-counter medication containing aspirin, chlorpheniramine, and phenylephrine  Cough especially bothersome at night. Nonsmoker. No history of asthma. Previous nausea with codeine but she thinks she has tolerated hydrocodone without difficulty  Past Medical History  Diagnosis Date  . Hypertension   . Diabetes mellitus   . Hyperlipidemia   . History of noncompliance with medical treatment    Past Surgical History  Procedure Laterality Date  . Diabetes    . Fibroids of breast      reports that she has never smoked. She has never used smokeless tobacco. She reports that she does not drink alcohol or use illicit drugs. family history includes Diabetes in her father, mother, sisters, and unspecified family member and Hypertension in her father, mother, sisters, and unspecified family member. Allergies  Allergen Reactions  . Codeine     REACTION: Nausea Vomiting     Review of Systems As per history of present illness    Objective:   Physical Exam  Constitutional: She appears well-developed and well-nourished.  HENT:  Right Ear: External ear normal.  Left Ear: External ear normal.  Mouth/Throat: Oropharynx is clear and moist.  Neck: Neck supple.  Cardiovascular: Normal rate.   Pulmonary/Chest: Effort normal and breath sounds normal. No respiratory distress. She has no wheezes. She has no rales.  Lymphadenopathy:    She has no cervical adenopathy.  Skin: No rash noted.           Assessment & Plan:  Viral URI with cough. Hycodan cough syrup 1 teaspoon each bedtime as needed. Continue over-the-counter analgesics as needed for fever and body aches

## 2012-06-21 ENCOUNTER — Ambulatory Visit: Payer: Self-pay | Admitting: Internal Medicine

## 2012-06-24 ENCOUNTER — Telehealth: Payer: Self-pay | Admitting: Internal Medicine

## 2012-06-24 NOTE — Telephone Encounter (Signed)
Patient Information:  Caller Name: Necia  Phone: [phone]  Patient: [name], [name]  Gender: Female  DOB: 1950/05/20  Age: 62 Years  PCP: Darryll Capers (Adults only)  Office Follow Up:  Does the office need to follow up with this patient?: No  Instructions For The Office: N/A  RN Note:  Episodes x 2 one week ago of facial pain and tingling in left side of face from outer corner of the eye down along the jaw.  No blisters or rash on face.  States had a "charlie horse" in leg at the same time.  Seen in April for fever and cough; has improved since then.  Per  sinus pain protocol, advised appt within 24 hours.  IN terms of the elbow pain, triaged;  states it hurts to lift a drinking glaas or sew due to twisting of the wrist.  Per elbow pain protocol, emergent symptoms denied; advised appt within 24 hours.  Appt scheduled 06/25/12 0900 with Dr. Lovell Sheehan.  krs/can  Symptoms  Reason For Call & Symptoms: right elbow pain; also left sided facial tenderness and stinging x 2 episodes  Reviewed Health History In EMR: Yes  Reviewed Medications In EMR: Yes  Reviewed Allergies In EMR: Yes  Reviewed Surgeries / Procedures: Yes  Date of Onset of Symptoms: 06/10/2012  Guideline(s) Used:  Sinus Pain and Congestion  Elbow Pain  Disposition Per Guideline:   See Today in Office  Reason For Disposition Reached:   Can't move joint normally (bend and straighten completely)  Advice Given:  N/A  Patient Will Follow Care Advice:  YES  Appointment Scheduled:  06/25/2012 09:00:00 Appointment Scheduled Provider:  Darryll Capers (Adults only)

## 2012-06-24 NOTE — Telephone Encounter (Signed)
For your review.  CAN scheduled appt.

## 2012-06-25 ENCOUNTER — Encounter: Payer: Self-pay | Admitting: Internal Medicine

## 2012-06-25 ENCOUNTER — Ambulatory Visit (INDEPENDENT_AMBULATORY_CARE_PROVIDER_SITE_OTHER): Payer: BC Managed Care – PPO | Admitting: Internal Medicine

## 2012-06-25 VITALS — BP 194/100 | HR 92 | Temp 98.8°F | Resp 16 | Ht 66.0 in | Wt 172.0 lb

## 2012-06-25 DIAGNOSIS — I10 Essential (primary) hypertension: Secondary | ICD-10-CM

## 2012-06-25 DIAGNOSIS — E1142 Type 2 diabetes mellitus with diabetic polyneuropathy: Secondary | ICD-10-CM

## 2012-06-25 DIAGNOSIS — B0229 Other postherpetic nervous system involvement: Secondary | ICD-10-CM

## 2012-06-25 DIAGNOSIS — E1149 Type 2 diabetes mellitus with other diabetic neurological complication: Secondary | ICD-10-CM | POA: Insufficient documentation

## 2012-06-25 MED ORDER — FOLIC ACID-VIT B6-VIT B12 0.8-50-0.1 MG PO TABS: 1.0000 | ORAL_TABLET | Freq: Every day | ORAL | Status: DC

## 2012-06-25 NOTE — Telephone Encounter (Signed)
Ov given

## 2012-06-25 NOTE — Patient Instructions (Signed)
The patient is instructed to continue all medications as prescribed. Schedule followup with check out clerk upon leaving the clinic  

## 2012-06-25 NOTE — Progress Notes (Signed)
  Subjective:    Patient ID: [name], female    DOB: Apr 18, 1950, 62 y.o.   MRN: [id number]  HPI Patient has noted left sided pain and and weakness " arm gets weak" and some movements make her "lose her grip" Has tingling at the site of the prior shingles Has worsening diabetic neuropathy of LE Blood pressure is elevated but she dod not take her medications today Has cut back on diet and las lost 10 pounds    Review of Systems  HENT: Negative.   Eyes: Negative.   Respiratory: Negative.   Neurological: Positive for weakness and light-headedness. Negative for dizziness and facial asymmetry.       Objective:   Physical Exam  Nursing note and vitals reviewed. Constitutional: She is oriented to person, place, and time. She appears well-developed and well-nourished. No distress.  HENT:  Head: Normocephalic and atraumatic.  Right Ear: External ear normal.  Left Ear: External ear normal.  Nose: Nose normal.  Mouth/Throat: Oropharynx is clear and moist.  Eyes: Conjunctivae and EOM are normal. Pupils are equal, round, and reactive to light.  Neck: Normal range of motion. Neck supple. No JVD present. No tracheal deviation present. No thyromegaly present.  Cardiovascular: Normal rate, regular rhythm, normal heart sounds and intact distal pulses.   No murmur heard. Pulmonary/Chest: Effort normal and breath sounds normal. She has no wheezes. She exhibits no tenderness.  Abdominal: Soft. Bowel sounds are normal.  Musculoskeletal: She exhibits no edema and no tenderness.  This along the medial epicondyles of the right elbow with evident tendinitis  Lymphadenopathy:    She has no cervical adenopathy.  Neurological: She is alert and oriented to person, place, and time. She has normal reflexes. No cranial nerve deficit.  No nystagmus equal grips but weakness due to pain in the elbow Peripheral neuropathy noted  Skin: Skin is warm and dry. She is not diaphoretic.  Psychiatric: She has  a normal mood and affect. Her behavior is normal.          Assessment & Plan:  The  diabetic neuropathy we will begin a B12 folic acid and B6 multivitamin\ She has lateral and medial epicondylitis of the right elbow we will use a wrapping technique to support the elbow Patient's blood pressure is poorly controlled but she did not take her medication today we urged never to skip her medication.

## 2012-08-09 ENCOUNTER — Ambulatory Visit: Payer: Self-pay | Admitting: Internal Medicine

## 2012-08-12 ENCOUNTER — Ambulatory Visit (INDEPENDENT_AMBULATORY_CARE_PROVIDER_SITE_OTHER): Payer: BC Managed Care – PPO | Admitting: Internal Medicine

## 2012-08-12 ENCOUNTER — Encounter: Payer: Self-pay | Admitting: Internal Medicine

## 2012-08-12 VITALS — BP 156/96 | HR 106 | Temp 98.2°F | Resp 16 | Ht 66.0 in | Wt 174.0 lb

## 2012-08-12 DIAGNOSIS — E1165 Type 2 diabetes mellitus with hyperglycemia: Secondary | ICD-10-CM

## 2012-08-12 DIAGNOSIS — I10 Essential (primary) hypertension: Secondary | ICD-10-CM

## 2012-08-12 DIAGNOSIS — E1149 Type 2 diabetes mellitus with other diabetic neurological complication: Secondary | ICD-10-CM

## 2012-08-12 DIAGNOSIS — E1169 Type 2 diabetes mellitus with other specified complication: Secondary | ICD-10-CM

## 2012-08-12 DIAGNOSIS — IMO0002 Reserved for concepts with insufficient information to code with codable children: Secondary | ICD-10-CM

## 2012-08-12 DIAGNOSIS — G609 Hereditary and idiopathic neuropathy, unspecified: Secondary | ICD-10-CM

## 2012-08-12 LAB — BASIC METABOLIC PANEL
BUN: 24 mg/dL — ABNORMAL HIGH (ref 6–23)
Chloride: 102 mEq/L (ref 96–112)
GFR: 42.42 mL/min — ABNORMAL LOW (ref >60.00)
Glucose, Bld: 221 mg/dL — ABNORMAL HIGH (ref 70–99)
Potassium: 4 mEq/L (ref 3.5–5.1)
Sodium: 135 mEq/L (ref 135–145)

## 2012-08-12 LAB — POCT URINALYSIS DIPSTICK
Bilirubin, UA: NEGATIVE
Glucose, UA: NEGATIVE
Ketones, UA: NEGATIVE
Nitrite, UA: NEGATIVE
pH, UA: 5.5

## 2012-08-12 MED ORDER — AMITRIPTYLINE HCL 25 MG PO TABS: 25.0000 mg | ORAL_TABLET | Freq: Every day | ORAL | Status: DC

## 2012-08-12 NOTE — Patient Instructions (Addendum)
avoid salty and fried foods

## 2012-08-12 NOTE — Progress Notes (Signed)
  Subjective:    Patient ID: [name], female    DOB: Aug 18, 1950, 62 y.o.   MRN: [id number]  HPI  Worsening peripheral neuropathy and leg cramps Poorly controlled DM and HTN Multi joint arthritis Salt and fried foods in diet     Review of Systems  Constitutional: Positive for activity change and appetite change. Negative for fatigue.  HENT: Negative for ear pain, congestion, neck pain, postnasal drip and sinus pressure.   Eyes: Negative for redness and visual disturbance.  Respiratory: Negative for cough, shortness of breath and wheezing.   Gastrointestinal: Negative for abdominal pain and abdominal distention.  Genitourinary: Positive for dysuria, urgency and decreased urine volume. Negative for frequency and menstrual problem.  Musculoskeletal: Negative for myalgias, joint swelling and arthralgias.  Skin: Negative for rash and wound.  Neurological: Negative for dizziness, weakness and headaches.  Hematological: Negative for adenopathy. Does not bruise/bleed easily.  Psychiatric/Behavioral: Negative for sleep disturbance and decreased concentration.       Objective:   Physical Exam  Constitutional: She is oriented to person, place, and time. She appears well-developed and well-nourished. No distress.  HENT:  Head: Normocephalic and atraumatic.  Eyes: Conjunctivae and EOM are normal. Pupils are equal, round, and reactive to light.  Neck: Normal range of motion. Neck supple. No JVD present. No tracheal deviation present. No thyromegaly present.  Cardiovascular: Normal rate, regular rhythm and intact distal pulses.   Murmur heard. Pulmonary/Chest: Effort normal and breath sounds normal. She has no wheezes. She exhibits no tenderness.  Abdominal: Soft. Bowel sounds are normal.  Musculoskeletal: She exhibits edema and tenderness.  Lymphadenopathy:    She has no cervical adenopathy.  Neurological: She is alert and oriented to person, place, and time. She has normal  reflexes. No cranial nerve deficit.  Skin: Skin is warm and dry. She is not diaphoretic.  Psychiatric: She has a normal mood and affect. Her behavior is normal.          Assessment & Plan:  Progressive neuropathy Add elavil 25  At bedtime moniter a1c and bmet Last a1c was 7.7

## 2012-09-23 ENCOUNTER — Other Ambulatory Visit: Payer: Self-pay | Admitting: *Deleted

## 2012-09-23 MED ORDER — INSULIN GLARGINE 100 UNIT/ML ~~LOC~~ SOLN: 40.0000 [IU] | Freq: Every day | SUBCUTANEOUS | Status: DC

## 2012-11-22 ENCOUNTER — Encounter: Payer: Self-pay | Admitting: Internal Medicine

## 2012-11-22 ENCOUNTER — Ambulatory Visit (INDEPENDENT_AMBULATORY_CARE_PROVIDER_SITE_OTHER): Payer: BC Managed Care – PPO | Admitting: Internal Medicine

## 2012-11-22 VITALS — BP 140/80 | HR 76 | Temp 98.0°F | Resp 16 | Ht 66.0 in | Wt 172.0 lb

## 2012-11-22 DIAGNOSIS — Z23 Encounter for immunization: Secondary | ICD-10-CM

## 2012-11-22 DIAGNOSIS — E1165 Type 2 diabetes mellitus with hyperglycemia: Secondary | ICD-10-CM

## 2012-11-22 DIAGNOSIS — IMO0002 Reserved for concepts with insufficient information to code with codable children: Secondary | ICD-10-CM

## 2012-11-22 DIAGNOSIS — I10 Essential (primary) hypertension: Secondary | ICD-10-CM

## 2012-11-22 MED ORDER — AMLODIPINE-OLMESARTAN 10-40 MG PO TABS: 1.0000 | ORAL_TABLET | Freq: Every day | ORAL | Status: DC

## 2012-11-22 MED ORDER — INSULIN LISPRO PROT & LISPRO (75-25 MIX) 100 UNIT/ML ~~LOC~~ SUSP: 20.0000 [IU] | Freq: Two times a day (BID) | SUBCUTANEOUS | Status: DC

## 2012-11-22 NOTE — Progress Notes (Signed)
  Subjective:    Patient ID: [name], female    DOB: 1950/06/08, 62 y.o.   MRN: [id number]  HPI  HTN and DM Has been experiencing shingles? 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  HENT: Negative for congestion, ear pain, postnasal drip and sinus pressure.   Eyes: Negative for redness and visual disturbance.  Respiratory: Negative for cough, shortness of breath and wheezing.   Gastrointestinal: Negative for abdominal pain and abdominal distention.  Genitourinary: Negative for dysuria, frequency and menstrual problem.  Musculoskeletal: Negative for arthralgias, joint swelling, myalgias and neck pain.  Skin: Negative for rash and wound.       No visible herpetic  rash  Neurological: Negative for dizziness, weakness and headaches.  Hematological: Negative for adenopathy. Does not bruise/bleed easily.  Psychiatric/Behavioral: Negative for sleep disturbance and decreased concentration.       Objective:   Physical Exam  Nursing note and vitals reviewed. Constitutional: She is oriented to person, place, and time. She appears well-developed and well-nourished. No distress.  HENT:  Head: Normocephalic and atraumatic.  Eyes: Conjunctivae and EOM are normal. Pupils are equal, round, and reactive to light.  Neck: Normal range of motion. Neck supple. No JVD present. No tracheal deviation present. No thyromegaly present.  Cardiovascular: Normal rate and regular rhythm.   No murmur heard. Pulmonary/Chest: Effort normal and breath sounds normal. She has no wheezes. She exhibits no tenderness.  Abdominal: Soft. Bowel sounds are normal.  Musculoskeletal: Normal range of motion. She exhibits no edema and no tenderness.  Lymphadenopathy:    She has no cervical adenopathy.  Neurological: She is alert and oriented to person, place, and time. She has normal  reflexes. No cranial nerve deficit.  Skin: Skin is warm and dry. She is not diaphoretic.  Psychiatric: She has a normal mood and affect. Her behavior is normal.          Assessment & Plan:  We'll convert from Lantus to combination insulin 75/25 20 units with breakfast and 20 units with supper  Samples of the insulin and samples of the syringes were given Stable blood pressure

## 2012-11-22 NOTE — Patient Instructions (Signed)
Change lantus to  combination

## 2012-12-01 ENCOUNTER — Other Ambulatory Visit: Payer: Self-pay | Admitting: *Deleted

## 2012-12-01 ENCOUNTER — Telehealth: Payer: Self-pay | Admitting: Internal Medicine

## 2012-12-01 MED ORDER — PREDNISOLONE ACETATE 1 % OP SUSP: 1.0000 [drp] | Freq: Four times a day (QID) | OPHTHALMIC | Status: DC

## 2012-12-01 NOTE — Telephone Encounter (Signed)
Per dr Lovell Sheehan- may have pred forte eye drops-take as directed

## 2012-12-01 NOTE — Telephone Encounter (Signed)
Pt states she was to have a med called in for her eyes pertaining to shingles on 11/03. But the pharm had no record of this. pls advise Pharm/[facility] Pt states her eyes are constantly running water. Pt did have shingles. pls call pt.

## 2013-04-25 ENCOUNTER — Ambulatory Visit (INDEPENDENT_AMBULATORY_CARE_PROVIDER_SITE_OTHER): Payer: BC Managed Care – PPO | Admitting: Internal Medicine

## 2013-04-25 ENCOUNTER — Encounter: Payer: Self-pay | Admitting: Internal Medicine

## 2013-04-25 VITALS — BP 162/94 | HR 104 | Temp 98.6°F | Ht 66.0 in | Wt 186.0 lb

## 2013-04-25 DIAGNOSIS — IMO0002 Reserved for concepts with insufficient information to code with codable children: Secondary | ICD-10-CM

## 2013-04-25 DIAGNOSIS — E1165 Type 2 diabetes mellitus with hyperglycemia: Secondary | ICD-10-CM

## 2013-04-25 DIAGNOSIS — E1169 Type 2 diabetes mellitus with other specified complication: Principal | ICD-10-CM

## 2013-04-25 MED ORDER — INSULIN ASPART PROT & ASPART (70-30 MIX) 100 UNIT/ML ~~LOC~~ SUSP: 20.0000 [IU] | Freq: Two times a day (BID) | SUBCUTANEOUS | Status: DC

## 2013-04-25 NOTE — Patient Instructions (Signed)
The patient is instructed to continue all medications as prescribed. Schedule followup with check out clerk upon leaving the clinic  

## 2013-04-25 NOTE — Progress Notes (Signed)
   Subjective:    Patient ID: [name], female    DOB: [birth year], 63 y.o.   MRN: [id number]  HPI Needs to change to relion for insulin due to cost Has been out of blood pressure medications     Review of Systems  Respiratory: Negative.   Cardiovascular: Negative.   Gastrointestinal: Negative.        Objective:   Physical Exam  Constitutional: She appears well-developed and well-nourished.  Cardiovascular: Regular rhythm.   Murmur heard. Pulmonary/Chest: Effort normal and breath sounds normal.  Abdominal: Soft. Bowel sounds are normal.          Assessment & Plan:  Change of medications due to cost

## 2013-04-25 NOTE — Progress Notes (Signed)
Pre visit review using our clinic review tool, if applicable. No additional management support is needed unless otherwise documented below in the visit note. 

## 2013-05-13 ENCOUNTER — Telehealth: Payer: Self-pay

## 2013-05-13 NOTE — Telephone Encounter (Signed)
Relevant patient education mailed to patient.  

## 2013-08-17 ENCOUNTER — Encounter (HOSPITAL_COMMUNITY): Payer: Self-pay | Admitting: Emergency Medicine

## 2013-08-17 ENCOUNTER — Telehealth: Payer: Self-pay | Admitting: Family Medicine

## 2013-08-17 ENCOUNTER — Ambulatory Visit: Payer: BC Managed Care – PPO | Admitting: Family Medicine

## 2013-08-17 ENCOUNTER — Emergency Department (HOSPITAL_COMMUNITY): Payer: BC Managed Care – PPO

## 2013-08-17 ENCOUNTER — Inpatient Hospital Stay (HOSPITAL_COMMUNITY)
Admission: EM | Admit: 2013-08-17 | Discharge: 2013-08-20 | DRG: 074 | Disposition: A | Discharge status: Home / Self Care | Payer: BC Managed Care – PPO | Attending: Internal Medicine | Admitting: Internal Medicine

## 2013-08-17 DIAGNOSIS — I10 Essential (primary) hypertension: Secondary | ICD-10-CM

## 2013-08-17 DIAGNOSIS — Z8249 Family history of ischemic heart disease and other diseases of the circulatory system: Secondary | ICD-10-CM

## 2013-08-17 DIAGNOSIS — I509 Heart failure, unspecified: Secondary | ICD-10-CM | POA: Diagnosis present

## 2013-08-17 DIAGNOSIS — Z885 Allergy status to narcotic agent status: Secondary | ICD-10-CM

## 2013-08-17 DIAGNOSIS — I5040 Unspecified combined systolic (congestive) and diastolic (congestive) heart failure: Secondary | ICD-10-CM | POA: Diagnosis present

## 2013-08-17 DIAGNOSIS — E1142 Type 2 diabetes mellitus with diabetic polyneuropathy: Secondary | ICD-10-CM | POA: Diagnosis present

## 2013-08-17 DIAGNOSIS — Z9119 Patient's noncompliance with other medical treatment and regimen: Secondary | ICD-10-CM

## 2013-08-17 DIAGNOSIS — Z5987 Material hardship due to limited financial resources, not elsewhere classified: Secondary | ICD-10-CM

## 2013-08-17 DIAGNOSIS — E1169 Type 2 diabetes mellitus with other specified complication: Secondary | ICD-10-CM

## 2013-08-17 DIAGNOSIS — Z91199 Patient's noncompliance with other medical treatment and regimen due to unspecified reason: Secondary | ICD-10-CM

## 2013-08-17 DIAGNOSIS — Z833 Family history of diabetes mellitus: Secondary | ICD-10-CM

## 2013-08-17 DIAGNOSIS — F411 Generalized anxiety disorder: Secondary | ICD-10-CM | POA: Diagnosis present

## 2013-08-17 DIAGNOSIS — E1149 Type 2 diabetes mellitus with other diabetic neurological complication: Principal | ICD-10-CM

## 2013-08-17 DIAGNOSIS — E1165 Type 2 diabetes mellitus with hyperglycemia: Secondary | ICD-10-CM

## 2013-08-17 DIAGNOSIS — R079 Chest pain, unspecified: Secondary | ICD-10-CM

## 2013-08-17 DIAGNOSIS — E785 Hyperlipidemia, unspecified: Secondary | ICD-10-CM

## 2013-08-17 DIAGNOSIS — Z598 Other problems related to housing and economic circumstances: Secondary | ICD-10-CM

## 2013-08-17 DIAGNOSIS — R29898 Other symptoms and signs involving the musculoskeletal system: Secondary | ICD-10-CM

## 2013-08-17 DIAGNOSIS — IMO0002 Reserved for concepts with insufficient information to code with codable children: Secondary | ICD-10-CM

## 2013-08-17 DIAGNOSIS — Z794 Long term (current) use of insulin: Secondary | ICD-10-CM

## 2013-08-17 DIAGNOSIS — N390 Urinary tract infection, site not specified: Secondary | ICD-10-CM | POA: Diagnosis present

## 2013-08-17 DIAGNOSIS — B0229 Other postherpetic nervous system involvement: Secondary | ICD-10-CM

## 2013-08-17 DIAGNOSIS — F3289 Other specified depressive episodes: Secondary | ICD-10-CM

## 2013-08-17 HISTORY — DX: Zoster without complications: B02.9

## 2013-08-17 LAB — CBC
HEMATOCRIT: 33.2 % — AB (ref 36.0–46.0)
Hemoglobin: 11.4 g/dL — ABNORMAL LOW (ref 12.0–15.0)
MCH: 28.3 pg (ref 26.0–34.0)
MCHC: 34.3 g/dL (ref 30.0–36.0)
MCV: 82.4 fL (ref 78.0–100.0)
Platelets: 281 10*3/uL (ref 150–400)
RBC: 4.03 MIL/uL (ref 3.87–5.11)
RDW: 13.1 % (ref 11.5–15.5)
WBC: 9.7 10*3/uL (ref 4.0–10.5)

## 2013-08-17 LAB — I-STAT VENOUS BLOOD GAS, ED
BICARBONATE: 25.3 meq/L — AB (ref 20.0–24.0)
O2 SAT: 35 %
PO2 VEN: 22 mmHg — AB (ref 30.0–45.0)
TCO2: 27 mmol/L (ref 0–100)
pCO2, Ven: 44 mmHg — ABNORMAL LOW (ref 45.0–50.0)
pH, Ven: 7.368 — ABNORMAL HIGH (ref 7.250–7.300)

## 2013-08-17 LAB — BASIC METABOLIC PANEL
ANION GAP: 13 (ref 5–15)
BUN: 23 mg/dL (ref 6–23)
CALCIUM: 9 mg/dL (ref 8.4–10.5)
CO2: 24 mEq/L (ref 19–32)
CREATININE: 1.65 mg/dL — AB (ref 0.50–1.10)
Chloride: 96 mEq/L (ref 96–112)
GFR, EST AFRICAN AMERICAN: 37 mL/min — AB (ref >90)
GFR, EST NON AFRICAN AMERICAN: 32 mL/min — AB (ref >90)
Glucose, Bld: 466 mg/dL — ABNORMAL HIGH (ref 70–99)
Potassium: 4.4 mEq/L (ref 3.7–5.3)
Sodium: 133 mEq/L — ABNORMAL LOW (ref 137–147)

## 2013-08-17 LAB — URINALYSIS, ROUTINE W REFLEX MICROSCOPIC
BILIRUBIN URINE: NEGATIVE
KETONES UR: NEGATIVE mg/dL
Leukocytes, UA: NEGATIVE
Nitrite: POSITIVE — AB
PH: 6 (ref 5.0–8.0)
Specific Gravity, Urine: 1.022 (ref 1.005–1.030)
Urobilinogen, UA: 0.2 mg/dL (ref 0.0–1.0)

## 2013-08-17 LAB — URINE MICROSCOPIC-ADD ON

## 2013-08-17 LAB — CBG MONITORING, ED
GLUCOSE-CAPILLARY: 433 mg/dL — AB (ref 70–99)
GLUCOSE-CAPILLARY: 466 mg/dL — AB (ref 70–99)
Glucose-Capillary: 182 mg/dL — ABNORMAL HIGH (ref 70–99)

## 2013-08-17 MED ORDER — ONDANSETRON HCL 4 MG/2ML IJ SOLN
4.0000 mg | Freq: Once | INTRAMUSCULAR | Status: AC
  Administered 2013-08-17: 4 mg via INTRAVENOUS
  Filled 2013-08-17: qty 2

## 2013-08-17 MED ORDER — NALOXONE HCL 0.4 MG/ML IJ SOLN
INTRAMUSCULAR | Status: AC
  Administered 2013-08-17: 0.4 mg
  Filled 2013-08-17: qty 1

## 2013-08-17 MED ORDER — INSULIN ASPART 100 UNIT/ML ~~LOC~~ SOLN
10.0000 [IU] | Freq: Once | SUBCUTANEOUS | Status: AC
  Administered 2013-08-17: 10 [IU] via SUBCUTANEOUS
  Filled 2013-08-17: qty 1

## 2013-08-17 MED ORDER — SODIUM CHLORIDE 0.9 % IV BOLUS (SEPSIS)
1000.0000 mL | Freq: Once | INTRAVENOUS | Status: AC
  Administered 2013-08-17: 1000 mL via INTRAVENOUS

## 2013-08-17 MED ORDER — ASPIRIN EC 325 MG PO TBEC
325.0000 mg | DELAYED_RELEASE_TABLET | Freq: Once | ORAL | Status: AC
  Administered 2013-08-17: 325 mg via ORAL
  Filled 2013-08-17: qty 1

## 2013-08-17 MED ORDER — DEXTROSE 5 % IV SOLN
1.0000 g | Freq: Once | INTRAVENOUS | Status: AC
  Administered 2013-08-17: 1 g via INTRAVENOUS
  Filled 2013-08-17: qty 10

## 2013-08-17 MED ORDER — MORPHINE SULFATE 4 MG/ML IJ SOLN
4.0000 mg | Freq: Once | INTRAMUSCULAR | Status: AC
  Administered 2013-08-17: 4 mg via INTRAVENOUS
  Filled 2013-08-17: qty 1

## 2013-08-17 NOTE — ED Notes (Signed)
While attempting to void in restroom, pt dropped specimen cup into toilet. EDP made aware.

## 2013-08-17 NOTE — ED Notes (Signed)
Resident, RN and tech in to check on pt. Pt is obtunded and unresponsive to painful stimuli. Pt also hypotensive. Narcan administered and pt will now localize pain.

## 2013-08-17 NOTE — ED Notes (Signed)
Attempted IV x 2.  Second RN to attempt.

## 2013-08-17 NOTE — Telephone Encounter (Signed)
Pt called our office, she is Dr. Arnoldo Morale patient and is transferring here.  She said that 2 Sundays ago she began having slurred speech, the left side of her face is numb, and she has had numbness and tingling in her left hand.  She has not been seen since those symptoms began and they have worsened.  I instructed pt that she needs to get to the hospital as soon as she can get there.  I asked her if I could call someone to take her or if she would like for me to call EMS.  She said that she did not want me to call anyone, that she would call her sister to take her to the [facility] ED as soon as she could get there.

## 2013-08-17 NOTE — ED Notes (Signed)
Second RN unable to attempt IV access. IV team paged for IV access.  EDP made aware.

## 2013-08-17 NOTE — ED Provider Notes (Signed)
CSN: [id number]     Arrival date & time 08/17/13  1423 History   First MD Initiated Contact with Patient 08/17/13 [phone]     Chief Complaint  Patient presents with  . Stroke Symptoms     (Consider location/radiation/quality/duration/timing/severity/associated sxs/prior Treatment) Patient is a 63 y.o. female presenting with weakness. The history is provided by the patient.  Weakness This is a new problem. The current episode started today. The problem occurs constantly. Associated symptoms include congestion, coughing and weakness. Pertinent negatives include no abdominal pain, chest pain, headaches, nausea or vomiting. The symptoms are aggravated by bending.      Past Medical History  Diagnosis Date  . Hypertension   . Diabetes mellitus   . Hyperlipidemia   . History of noncompliance with medical treatment    Past Surgical History  Procedure Laterality Date  . Diabetes    . Fibroids of breast     Family History  Problem Relation Age of Onset  . Diabetes    . Hypertension    . Hypertension Father   . Diabetes Father   . Hypertension Mother   . Diabetes Mother   . Diabetes Sister   . Hypertension Sister   . Diabetes Sister   . Hypertension Sister    History  Substance Use Topics  . Smoking status: Never Smoker   . Smokeless tobacco: Never Used  . Alcohol Use: No   OB History   Grav Para Term Preterm Abortions TAB SAB Ect Mult Living   4 4 4       3      Review of Systems  Constitutional: Negative for activity change.  HENT: Positive for congestion.   Respiratory: Positive for cough and shortness of breath.   Cardiovascular: Negative for chest pain and leg swelling.  Gastrointestinal: Negative for nausea, vomiting, abdominal pain, diarrhea, constipation, blood in stool and abdominal distention.  Genitourinary: Negative for dysuria, flank pain and vaginal discharge.  Musculoskeletal: Negative for back pain.  Skin: Negative for color change.  Neurological:  Positive for weakness. Negative for syncope and headaches.  Psychiatric/Behavioral: Negative for agitation.      Allergies  Codeine  Home Medications   Prior to Admission medications   Medication Sig Start Date End Date Taking? Authorizing Provider  amLODipine-olmesartan (AZOR) 10-40 MG per tablet Take 1 tablet by mouth daily. 11/22/12   Ricard Dillon, MD  insulin aspart protamine- aspart (NOVOLOG MIX 70/30) (70-30) 100 UNIT/ML injection Inject 0.2 mLs (20 Units total) into the skin 2 (two) times daily with a meal. 04/25/13   Ricard Dillon, MD  insulin lispro protamine-lispro (HUMALOG MIX 75/25) (75-25) 100 UNIT/ML SUSP injection Inject 20 Units into the skin 2 (two) times daily with a meal. 11/22/12   Ricard Dillon, MD  sitaGLIPtan-metformin (JANUMET) 50-500 MG per tablet Take 1 tablet by mouth 2 (two) times daily with a meal. 11/03/11   Ricard Dillon, MD   BP 142/73  Pulse 110  Temp(Src) 98.5 F (36.9 C) (Oral)  Resp 18  Ht 5\' 6"  (1.676 m)  Wt 190 lb (86.183 kg)  BMI 30.68 kg/m2  SpO2 99% Physical Exam  Constitutional: She is oriented to person, place, and time. She appears well-developed.  HENT:  Head: Normocephalic.  Mucous membrane dry    Eyes: Pupils are equal, round, and reactive to light.  Neck: Neck supple.  Cardiovascular: Normal rate.  Exam reveals no gallop and no friction rub.   No murmur heard. Pulmonary/Chest: Effort normal  and breath sounds normal. No respiratory distress.  Abdominal: Soft. She exhibits no distension. There is tenderness. There is no rebound.  diffuse tenderness   Musculoskeletal: She exhibits no edema.  Neurological: She is alert and oriented to person, place, and time.  Skin: Skin is warm.  Psychiatric: She has a normal mood and affect.    ED Course  Procedures (including critical care time) Labs Review Labs Reviewed  CBC - Abnormal; Notable for the following:    Hemoglobin 11.4 (*)    HCT 33.2 (*)    All other components within  normal limits  BASIC METABOLIC PANEL - Abnormal; Notable for the following:    Sodium 133 (*)    Glucose, Bld 466 (*)    Creatinine, Ser 1.65 (*)    GFR calc non Af Amer 32 (*)    GFR calc Af Amer 37 (*)    All other components within normal limits  URINALYSIS, ROUTINE W REFLEX MICROSCOPIC - Abnormal; Notable for the following:    Glucose, UA >1000 (*)    Hgb urine dipstick SMALL (*)    Protein, ur >300 (*)    Nitrite POSITIVE (*)    All other components within normal limits  URINE MICROSCOPIC-ADD ON - Abnormal; Notable for the following:    Squamous Epithelial / LPF FEW (*)    Bacteria, UA MANY (*)    All other components within normal limits  BASIC METABOLIC PANEL - Abnormal; Notable for the following:    Glucose, Bld 107 (*)    Creatinine, Ser 1.47 (*)    GFR calc non Af Amer 37 (*)    GFR calc Af Amer 42 (*)    All other components within normal limits  HEPATIC FUNCTION PANEL - Abnormal; Notable for the following:    Total Protein 5.8 (*)    Albumin 2.2 (*)    Total Bilirubin <0.2 (*)    All other components within normal limits  HEMOGLOBIN A1C - Abnormal; Notable for the following:    Hemoglobin A1C 16.8 (*)    Mean Plasma Glucose 435 (*)    All other components within normal limits  COMPREHENSIVE METABOLIC PANEL - Abnormal; Notable for the following:    Sodium 136 (*)    Glucose, Bld 152 (*)    Creatinine, Ser 1.41 (*)    Total Protein 5.8 (*)    Albumin 2.1 (*)    Total Bilirubin <0.2 (*)    GFR calc non Af Amer 38 (*)    GFR calc Af Amer 45 (*)    All other components within normal limits  CBC WITH DIFFERENTIAL - Abnormal; Notable for the following:    RBC 3.80 (*)    Hemoglobin 10.7 (*)    HCT 31.7 (*)    All other components within normal limits  LIPID PANEL - Abnormal; Notable for the following:    Cholesterol 287 (*)    Triglycerides 311 (*)    HDL 32 (*)    VLDL 62 (*)    LDL Cholesterol 193 (*)    All other components within normal limits  GLUCOSE,  CAPILLARY - Abnormal; Notable for the following:    Glucose-Capillary 148 (*)    All other components within normal limits  IRON AND TIBC - Abnormal; Notable for the following:    Iron 17 (*)    TIBC 182 (*)    Saturation Ratios 9 (*)    All other components within normal limits  RETICULOCYTES - Abnormal; Notable  for the following:    RBC. 3.70 (*)    All other components within normal limits  GLUCOSE, CAPILLARY - Abnormal; Notable for the following:    Glucose-Capillary 109 (*)    All other components within normal limits  GLUCOSE, CAPILLARY - Abnormal; Notable for the following:    Glucose-Capillary 150 (*)    All other components within normal limits  GLUCOSE, CAPILLARY - Abnormal; Notable for the following:    Glucose-Capillary 159 (*)    All other components within normal limits  GLUCOSE, CAPILLARY - Abnormal; Notable for the following:    Glucose-Capillary 305 (*)    All other components within normal limits  CBG MONITORING, ED - Abnormal; Notable for the following:    Glucose-Capillary 433 (*)    All other components within normal limits  CBG MONITORING, ED - Abnormal; Notable for the following:    Glucose-Capillary 466 (*)    All other components within normal limits  I-STAT VENOUS BLOOD GAS, ED - Abnormal; Notable for the following:    pH, Ven 7.368 (*)    pCO2, Ven 44.0 (*)    pO2, Ven 22.0 (*)    Bicarbonate 25.3 (*)    All other components within normal limits  CBG MONITORING, ED - Abnormal; Notable for the following:    Glucose-Capillary 182 (*)    All other components within normal limits  MRSA PCR SCREENING  MRSA PCR SCREENING  TROPONIN I  AMMONIA  TSH  TROPONIN I  TROPONIN I  TROPONIN I  URINE RAPID DRUG SCREEN (HOSP PERFORMED)  VITAMIN B12  FOLATE  FERRITIN  CBC    Imaging Review Dg Chest 2 View  08/17/2013   CLINICAL DATA:  Hypertension.  EXAM: CHEST  2 VIEW  COMPARISON:  None.  FINDINGS: Mediastinum and hilar structures normal. Tiny nodular  density is noted projected over the left upper lobe at the level of the anterior left second rib. To exclude a true pulmonary nodule nonenhanced chest CT is suggested. Lungs are clear of acute infiltrates. No pleural effusion or pneumothorax. Heart size and pulmonary vascularity normal. Diffuse degenerative changes thoracic spine.  IMPRESSION: Tiny nodular density projected over the left upper lobe, this could represent a true pulmonary nodule and nonemergent nonenhanced chest CT is suggested for further evaluation   Electronically Signed   By: Marcello Moores  Register   On: 08/17/2013 16:36   Ct Head Wo Contrast  08/17/2013   CLINICAL DATA:  Facial tingling and slurred speech  EXAM: CT HEAD WITHOUT CONTRAST  TECHNIQUE: Contiguous axial images were obtained from the base of the skull through the vertex without intravenous contrast.  COMPARISON:  None.  FINDINGS: The bony calvarium is intact. The ventricles are of normal size and configuration. No findings to suggest acute hemorrhage, acute infarction or space-occupying mass lesion are noted.  IMPRESSION: No acute abnormality is noted.   Electronically Signed   By: Inez Catalina M.D.   On: 08/17/2013 20:35   Mr Brain Wo Contrast  08/18/2013   CLINICAL DATA:  Right facial numbness, upper extremity tingling, and lower extremity weakness. Stroke.  EXAM: MRI HEAD WITHOUT CONTRAST  TECHNIQUE: Multiplanar, multiecho pulse sequences of the brain and surrounding structures were obtained without intravenous contrast.  COMPARISON:  Head CT 08/17/2013  FINDINGS: There is no evidence of acute infarct, intracranial hemorrhage, mass, midline shift, or extra-axial fluid collection. Small foci of T2 hyperintensity in the subcortical and periventricular white matter are nonspecific but compatible with mild chronic small vessel ischemic  disease. There is mild cerebral atrophy.  Orbits are unremarkable. Left maxillary sinus mucous retention cyst is noted. Mastoid air cells are clear.  Major intracranial vascular flow voids are preserved.  IMPRESSION: 1. No acute intracranial abnormality. 2. Mild chronic small vessel ischemic disease and cerebral atrophy.   Electronically Signed   By: Logan Bores   On: 08/18/2013 20:47   Mr Thoracic Spine Wo Contrast  08/18/2013   CLINICAL DATA:  Bilateral lower extremity weakness with bladder impairment.  EXAM: MRI THORACIC SPINE WITHOUT CONTRAST  TECHNIQUE: Multiplanar, multisequence MR imaging of the thoracic spine was performed. No intravenous contrast was administered.  COMPARISON:  Chest radiograph 08/17/2013  FINDINGS: Images are mildly degraded by motion.  There is slight upper thoracic levoscoliosis. There is no listhesis. Vertebral body heights are preserved without compression fracture. Vertebral bone marrow signal is within normal limits. The thoracic spinal cord is normal in caliber and grossly normal in signal allowing for motion. Conus medullaris terminates at L1. There is mild lower thoracic facet arthrosis, most prominently on the right at T9-10. There is mild bilateral neural foraminal narrowing at T9-10 and T10-11. No disc herniation or spinal canal stenosis is seen. There is no spinal cord compression.  There are small bilateral pleural effusions. T2 hyperintense lesions are partially visualized in the upper pole the right kidney, possibly cyst but incompletely evaluated.  IMPRESSION: 1. No thoracic spinal canal stenosis or cord compression. 2. Lower thoracic facet arthrosis with mild neural foraminal narrowing as above. 3. Small bilateral pleural effusions.   Electronically Signed   By: Logan Bores   On: 08/18/2013 20:55     EKG Interpretation   Date/Time:  Wednesday August 17 2013 14:35:09 EDT Ventricular Rate:  107 PR Interval:  126 QRS Duration: 80 QT Interval:  320 QTC Calculation: 427 R Axis:   52 Text Interpretation:  Sinus tachycardia Left ventricular hypertrophy with  repolarization abnormality Abnormal ECG Sinus  tachycardia T wave inversion  Left ventricular hypertrophy Abnormal ekg Confirmed by Carmin Muskrat   MD [id number]) on 08/17/2013 3:14:08 PM      MDM   Final diagnoses:  Weakness of left lower extremity  Accelerated hypertension  Chest pain, unspecified chest pain type  Diabetes mellitus type 2, uncontrolled   63 y/o female with history of DMII that presents with new weakness.  Time of onset is unclear, but seems to have been at lease several days ago, with recent progression. Patient required admission for further E/M.   Claudean Severance, MD 08/19/13 0440  This patient was seen in conjunction with the resident physician. The documentation accurately reflects the patient's encounter in the emergency department. On my exam, patient was in no distress, though she continued to complain of weakness, with an unclear time of onset, with recent progression. With concern for stroke like symptoms, though the patient clearly out of window for emergent intervention, patient was admitted for further evaluation and management. EKG showed sinus tachycardia, 107, T wave inversions, abnormal   Carmin Muskrat, MD 08/20/13 [phone]

## 2013-08-17 NOTE — ED Notes (Signed)
Patient states she started having R facial tingling, slurred speech, left hand tingling, and bilateral leg weakness x 2 days.   Patient states she wanted to wait for her sister to bring her versus calling 911 and letting them take her in.  Patient states no pain at this time.   Patient states none of the symptoms have resolved.

## 2013-08-18 ENCOUNTER — Inpatient Hospital Stay (HOSPITAL_COMMUNITY): Payer: BC Managed Care – PPO

## 2013-08-18 ENCOUNTER — Encounter (HOSPITAL_COMMUNITY): Payer: Self-pay | Admitting: Emergency Medicine

## 2013-08-18 DIAGNOSIS — R29898 Other symptoms and signs involving the musculoskeletal system: Secondary | ICD-10-CM

## 2013-08-18 DIAGNOSIS — E1165 Type 2 diabetes mellitus with hyperglycemia: Secondary | ICD-10-CM | POA: Diagnosis present

## 2013-08-18 DIAGNOSIS — IMO0001 Reserved for inherently not codable concepts without codable children: Secondary | ICD-10-CM

## 2013-08-18 DIAGNOSIS — B029 Zoster without complications: Secondary | ICD-10-CM

## 2013-08-18 DIAGNOSIS — R079 Chest pain, unspecified: Secondary | ICD-10-CM

## 2013-08-18 DIAGNOSIS — I10 Essential (primary) hypertension: Secondary | ICD-10-CM | POA: Diagnosis present

## 2013-08-18 DIAGNOSIS — IMO0002 Reserved for concepts with insufficient information to code with codable children: Secondary | ICD-10-CM | POA: Diagnosis present

## 2013-08-18 DIAGNOSIS — I059 Rheumatic mitral valve disease, unspecified: Secondary | ICD-10-CM

## 2013-08-18 HISTORY — DX: Zoster without complications: B02.9

## 2013-08-18 LAB — BASIC METABOLIC PANEL
Anion gap: 11 (ref 5–15)
BUN: 20 mg/dL (ref 6–23)
CHLORIDE: 102 meq/L (ref 96–112)
CO2: 25 meq/L (ref 19–32)
Calcium: 8.8 mg/dL (ref 8.4–10.5)
Creatinine, Ser: 1.47 mg/dL — ABNORMAL HIGH (ref 0.50–1.10)
GFR calc Af Amer: 42 mL/min — ABNORMAL LOW (ref >90)
GFR calc non Af Amer: 37 mL/min — ABNORMAL LOW (ref >90)
GLUCOSE: 107 mg/dL — AB (ref 70–99)
POTASSIUM: 3.7 meq/L (ref 3.7–5.3)
SODIUM: 138 meq/L (ref 137–147)

## 2013-08-18 LAB — HEPATIC FUNCTION PANEL
ALBUMIN: 2.2 g/dL — AB (ref 3.5–5.2)
ALK PHOS: 89 U/L (ref 39–117)
ALT: 17 U/L (ref 0–35)
AST: 16 U/L (ref 0–37)
Bilirubin, Direct: <0.2 mg/dL (ref 0.0–0.3)
TOTAL PROTEIN: 5.8 g/dL — AB (ref 6.0–8.3)
Total Bilirubin: <0.2 mg/dL — ABNORMAL LOW (ref 0.3–1.2)

## 2013-08-18 LAB — LIPID PANEL
CHOLESTEROL: 287 mg/dL — AB (ref 0–200)
HDL: 32 mg/dL — AB (ref >39)
LDL Cholesterol: 193 mg/dL — ABNORMAL HIGH (ref 0–99)
TRIGLYCERIDES: 311 mg/dL — AB (ref <150)
Total CHOL/HDL Ratio: 9 RATIO
VLDL: 62 mg/dL — ABNORMAL HIGH (ref 0–40)

## 2013-08-18 LAB — COMPREHENSIVE METABOLIC PANEL
ALT: 15 U/L (ref 0–35)
ANION GAP: 12 (ref 5–15)
AST: 19 U/L (ref 0–37)
Albumin: 2.1 g/dL — ABNORMAL LOW (ref 3.5–5.2)
Alkaline Phosphatase: 89 U/L (ref 39–117)
BUN: 20 mg/dL (ref 6–23)
CHLORIDE: 101 meq/L (ref 96–112)
CO2: 23 mEq/L (ref 19–32)
CREATININE: 1.41 mg/dL — AB (ref 0.50–1.10)
Calcium: 8.8 mg/dL (ref 8.4–10.5)
GFR calc non Af Amer: 38 mL/min — ABNORMAL LOW (ref >90)
GFR, EST AFRICAN AMERICAN: 45 mL/min — AB (ref >90)
GLUCOSE: 152 mg/dL — AB (ref 70–99)
Potassium: 3.8 mEq/L (ref 3.7–5.3)
Sodium: 136 mEq/L — ABNORMAL LOW (ref 137–147)
Total Protein: 5.8 g/dL — ABNORMAL LOW (ref 6.0–8.3)

## 2013-08-18 LAB — GLUCOSE, CAPILLARY
GLUCOSE-CAPILLARY: 148 mg/dL — AB (ref 70–99)
GLUCOSE-CAPILLARY: 159 mg/dL — AB (ref 70–99)
Glucose-Capillary: 109 mg/dL — ABNORMAL HIGH (ref 70–99)
Glucose-Capillary: 150 mg/dL — ABNORMAL HIGH (ref 70–99)
Glucose-Capillary: 305 mg/dL — ABNORMAL HIGH (ref 70–99)

## 2013-08-18 LAB — CBC WITH DIFFERENTIAL/PLATELET
BASOS PCT: 0 % (ref 0–1)
Basophils Absolute: 0 10*3/uL (ref 0.0–0.1)
Eosinophils Absolute: 0.1 10*3/uL (ref 0.0–0.7)
Eosinophils Relative: 1 % (ref 0–5)
HCT: 31.7 % — ABNORMAL LOW (ref 36.0–46.0)
Hemoglobin: 10.7 g/dL — ABNORMAL LOW (ref 12.0–15.0)
Lymphocytes Relative: 17 % (ref 12–46)
Lymphs Abs: 1.7 10*3/uL (ref 0.7–4.0)
MCH: 28.2 pg (ref 26.0–34.0)
MCHC: 33.8 g/dL (ref 30.0–36.0)
MCV: 83.4 fL (ref 78.0–100.0)
Monocytes Absolute: 0.9 10*3/uL (ref 0.1–1.0)
Monocytes Relative: 9 % (ref 3–12)
NEUTROS ABS: 7.4 10*3/uL (ref 1.7–7.7)
NEUTROS PCT: 73 % (ref 43–77)
PLATELETS: 227 10*3/uL (ref 150–400)
RBC: 3.8 MIL/uL — ABNORMAL LOW (ref 3.87–5.11)
RDW: 13.2 % (ref 11.5–15.5)
WBC: 10.1 10*3/uL (ref 4.0–10.5)

## 2013-08-18 LAB — TROPONIN I
Troponin I: <0.3 ng/mL (ref <0.30)
Troponin I: <0.3 ng/mL (ref <0.30)
Troponin I: <0.3 ng/mL (ref <0.30)
Troponin I: <0.3 ng/mL (ref <0.30)

## 2013-08-18 LAB — RAPID URINE DRUG SCREEN, HOSP PERFORMED
AMPHETAMINES: NOT DETECTED
Barbiturates: NOT DETECTED
Benzodiazepines: NOT DETECTED
Cocaine: NOT DETECTED
OPIATES: NOT DETECTED
Tetrahydrocannabinol: NOT DETECTED

## 2013-08-18 LAB — IRON AND TIBC
Iron: 17 ug/dL — ABNORMAL LOW (ref 42–135)
Saturation Ratios: 9 % — ABNORMAL LOW (ref 20–55)
TIBC: 182 ug/dL — ABNORMAL LOW (ref 250–470)
UIBC: 165 ug/dL (ref 125–400)

## 2013-08-18 LAB — TSH: TSH: 3.07 u[IU]/mL (ref 0.350–4.500)

## 2013-08-18 LAB — RETICULOCYTES
RBC.: 3.7 MIL/uL — AB (ref 3.87–5.11)
RETIC COUNT ABSOLUTE: 55.5 10*3/uL (ref 19.0–186.0)
Retic Ct Pct: 1.5 % (ref 0.4–3.1)

## 2013-08-18 LAB — VITAMIN B12: Vitamin B-12: 301 pg/mL (ref 211–911)

## 2013-08-18 LAB — FOLATE: FOLATE: 11.5 ng/mL

## 2013-08-18 LAB — HEMOGLOBIN A1C
Hgb A1c MFr Bld: 16.8 % — ABNORMAL HIGH (ref <5.7)
MEAN PLASMA GLUCOSE: 435 mg/dL — AB (ref <117)

## 2013-08-18 LAB — AMMONIA: Ammonia: 21 umol/L (ref 11–60)

## 2013-08-18 LAB — MRSA PCR SCREENING: MRSA by PCR: NEGATIVE

## 2013-08-18 LAB — FERRITIN: Ferritin: 80 ng/mL (ref 10–291)

## 2013-08-18 MED ORDER — MUSCLE RUB 10-15 % EX CREA
TOPICAL_CREAM | CUTANEOUS | Status: DC | PRN
  Filled 2013-08-18: qty 85

## 2013-08-18 MED ORDER — SENNOSIDES-DOCUSATE SODIUM 8.6-50 MG PO TABS
1.0000 | ORAL_TABLET | Freq: Every evening | ORAL | Status: DC | PRN
  Filled 2013-08-18: qty 1

## 2013-08-18 MED ORDER — ASPIRIN 325 MG PO TABS
325.0000 mg | ORAL_TABLET | Freq: Every day | ORAL | Status: DC
  Administered 2013-08-18 – 2013-08-20 (×3): 325 mg via ORAL
  Filled 2013-08-18 (×3): qty 1

## 2013-08-18 MED ORDER — TROLAMINE SALICYLATE 10 % EX CREA
TOPICAL_CREAM | Freq: Two times a day (BID) | CUTANEOUS | Status: DC | PRN
  Filled 2013-08-18: qty 85

## 2013-08-18 MED ORDER — CLONAZEPAM 0.5 MG PO TABS
0.5000 mg | ORAL_TABLET | Freq: Every day | ORAL | Status: DC
  Administered 2013-08-18 – 2013-08-19 (×2): 0.5 mg via ORAL
  Filled 2013-08-18 (×2): qty 1

## 2013-08-18 MED ORDER — AMLODIPINE-OLMESARTAN 5-40 MG PO TABS: 1.0000 | ORAL_TABLET | Freq: Every day | ORAL | Status: DC

## 2013-08-18 MED ORDER — IRBESARTAN 300 MG PO TABS
300.0000 mg | ORAL_TABLET | Freq: Every day | ORAL | Status: DC
  Administered 2013-08-18 – 2013-08-20 (×3): 300 mg via ORAL
  Filled 2013-08-18 (×3): qty 1

## 2013-08-18 MED ORDER — ENOXAPARIN SODIUM 40 MG/0.4ML ~~LOC~~ SOLN
40.0000 mg | Freq: Every day | SUBCUTANEOUS | Status: DC
  Administered 2013-08-18 – 2013-08-20 (×3): 40 mg via SUBCUTANEOUS
  Filled 2013-08-18 (×3): qty 0.4

## 2013-08-18 MED ORDER — ACETAMINOPHEN 325 MG PO TABS
650.0000 mg | ORAL_TABLET | Freq: Four times a day (QID) | ORAL | Status: DC | PRN
  Administered 2013-08-18: 650 mg via ORAL
  Filled 2013-08-18: qty 2

## 2013-08-18 MED ORDER — LORAZEPAM 2 MG/ML IJ SOLN: 1.0000 mg | Freq: Once | INTRAMUSCULAR | Status: AC | PRN

## 2013-08-18 MED ORDER — DEXTROSE 5 % IV SOLN
1.0000 g | INTRAVENOUS | Status: DC
  Administered 2013-08-18 – 2013-08-19 (×2): 1 g via INTRAVENOUS
  Filled 2013-08-18 (×3): qty 10

## 2013-08-18 MED ORDER — ASPIRIN 300 MG RE SUPP
300.0000 mg | Freq: Every day | RECTAL | Status: DC
  Filled 2013-08-18 (×2): qty 1

## 2013-08-18 MED ORDER — INSULIN GLARGINE 100 UNIT/ML ~~LOC~~ SOLN
20.0000 [IU] | Freq: Two times a day (BID) | SUBCUTANEOUS | Status: DC
  Administered 2013-08-18 (×2): 20 [IU] via SUBCUTANEOUS
  Filled 2013-08-18 (×4): qty 0.2

## 2013-08-18 MED ORDER — STROKE: EARLY STAGES OF RECOVERY BOOK
Freq: Once | Status: AC
  Administered 2013-08-18: 07:00:00
  Filled 2013-08-18: qty 1

## 2013-08-18 MED ORDER — INSULIN ASPART 100 UNIT/ML ~~LOC~~ SOLN
0.0000 [IU] | Freq: Three times a day (TID) | SUBCUTANEOUS | Status: DC
  Administered 2013-08-19: 1 [IU] via SUBCUTANEOUS
  Administered 2013-08-19: 5 [IU] via SUBCUTANEOUS
  Administered 2013-08-19: 2 [IU] via SUBCUTANEOUS

## 2013-08-18 MED ORDER — ONDANSETRON HCL 4 MG/2ML IJ SOLN
4.0000 mg | Freq: Four times a day (QID) | INTRAMUSCULAR | Status: DC
  Administered 2013-08-18: 4 mg via INTRAVENOUS
  Filled 2013-08-18: qty 2

## 2013-08-18 MED ORDER — HYDRALAZINE HCL 20 MG/ML IJ SOLN: 10.0000 mg | Freq: Four times a day (QID) | INTRAMUSCULAR | Status: DC | PRN

## 2013-08-18 MED ORDER — LABETALOL HCL 5 MG/ML IV SOLN
5.0000 mg | INTRAVENOUS | Status: DC | PRN
  Administered 2013-08-18: 5 mg via INTRAVENOUS
  Filled 2013-08-18: qty 4

## 2013-08-18 MED ORDER — AMLODIPINE BESYLATE 5 MG PO TABS
5.0000 mg | ORAL_TABLET | Freq: Every day | ORAL | Status: DC
  Administered 2013-08-18 – 2013-08-20 (×3): 5 mg via ORAL
  Filled 2013-08-18 (×3): qty 1

## 2013-08-18 MED ORDER — INSULIN ASPART 100 UNIT/ML ~~LOC~~ SOLN
0.0000 [IU] | Freq: Every day | SUBCUTANEOUS | Status: DC
  Administered 2013-08-18: 4 [IU] via SUBCUTANEOUS
  Administered 2013-08-19: 2 [IU] via SUBCUTANEOUS

## 2013-08-18 MED ORDER — ESCITALOPRAM OXALATE 5 MG PO TABS
5.0000 mg | ORAL_TABLET | Freq: Every day | ORAL | Status: DC
  Administered 2013-08-18 – 2013-08-20 (×3): 5 mg via ORAL
  Filled 2013-08-18 (×3): qty 1

## 2013-08-18 MED ORDER — SODIUM CHLORIDE 0.9 % IV SOLN
INTRAVENOUS | Status: AC
  Administered 2013-08-18 (×2): via INTRAVENOUS

## 2013-08-18 NOTE — Progress Notes (Signed)
Inpatient Diabetes Program Recommendations  AACE/ADA: New Consensus Statement on Inpatient Glycemic Control (2013)  Target Ranges:  Prepandial:   less than 140 mg/dL      Peak postprandial:   less than 180 mg/dL (1-2 hours)      Critically ill patients:  140 - 180 mg/dL  Results for CORDIE, BEAZLEY (MRN [id number]) as of 08/18/2013 12:54  Ref. Range 08/18/2013 07:43 08/18/2013 12:37  Glucose-Capillary Latest Range: 70-99 mg/dL 109 (H) 150 (H)   Inpatient Diabetes Program Recommendations Correction (SSI): add Novolog sensitive scale TID May need to reduce Lantus to 1/2 home dose.   Thank you  Raoul Pitch BSN, RN,CDE Inpatient Diabetes Coordinator [phone] (team pager)

## 2013-08-18 NOTE — Consult Note (Signed)
Referring Physician: Dr. Hal Hope    Chief Complaint: bilateral lower ext weakness, dysarthria  HPI:                                                                                                                                         [name] is an 63 y.o. female with a past medical history significant for HTN, hyperlipidemia, DM complicated by diabetic neuropathy, shingles, and non adherence to treatment, admitted to [facility] due to the above stated symptoms. She provides a history of progressive, steady worsening of imbalance, legs weakness, burning pain and numbness lower extremities, mainly the left leg for quite some time, but significantly worse in the last 2 weeks to the point that she has been unable to walk even with her cane. In addition, she said that she developed slurred speech couple of days ago as well as inability to control her bladder since yesterday. Denies recent fever, diarrhea, or infection. No HA, vertigo, double vision, weakness upper extremities, or new changes in visual acuity. Complains of chest pain that started couple of days ago. It is worth noting that she had had prior episodes of inability to walk secondary to her underlying neuropathy. CT brain unremarkable for acute abnormality.  Date last known well: uncertain Time last known well: uncertain tPA Given: no, late presentation   Past Medical History  Diagnosis Date  . Hypertension   . Diabetes mellitus   . Hyperlipidemia   . History of noncompliance with medical treatment   . Shingles 08/18/2013    Past Surgical History  Procedure Laterality Date  . Diabetes    . Fibroids of breast      Family History  Problem Relation Age of Onset  . Diabetes    . Hypertension    . Hypertension Father   . Diabetes Father   . Hypertension Mother   . Diabetes Mother   . Diabetes Sister   . Hypertension Sister   . Diabetes Sister   . Hypertension Sister    Social History:  reports that she has never  smoked. She has never used smokeless tobacco. She reports that she does not drink alcohol or use illicit drugs.  Allergies:  Allergies  Allergen Reactions  . Codeine     REACTION: Nausea Vomiting  . Grapefruit Concentrate     itching  . Peanut-Containing Drug Products     itching    Medications:  Scheduled: .  stroke: mapping our early stages of recovery book   Does not apply Once  . irbesartan  300 mg Oral Daily   And  . amLODipine  5 mg Oral Daily  . aspirin  300 mg Rectal Daily   Or  . aspirin  325 mg Oral Daily  . enoxaparin (LOVENOX) injection  40 mg Subcutaneous Daily  . insulin glargine  20 Units Subcutaneous BID    ROS:                                                                                                                                       History obtained from the patient, family, and chart review  General ROS: negative for - chills, fever, night sweats, or weight loss Psychological ROS: negative for - behavioral disorder, hallucinations, memory difficulties, mood swings or suicidal ideation Ophthalmic ROS: negative for - blurry vision, double vision, eye pain or loss of vision ENT ROS: negative for - epistaxis, nasal discharge, oral lesions, sore throat, tinnitus or vertigo Allergy and Immunology ROS: negative for - hives or itchy/watery eyes Hematological and Lymphatic ROS: negative for - bleeding problems, bruising or swollen lymph nodes Endocrine ROS: negative for - galactorrhea, hair pattern changes, polydipsia/polyuria or temperature intolerance Respiratory ROS: negative for - cough, hemoptysis, shortness of breath or wheezing Cardiovascular ROS: negative for -dyspnea on exertion, edema or irregular heartbeat Gastrointestinal ROS: negative for - abdominal pain, diarrhea, hematemesis, nausea/vomiting or stool  incontinence Genito-Urinary ROS: negative for - dysuria, hematuria, incontinence or urinary frequency/urgency Musculoskeletal ROS: negative for - joint swelling Neurological ROS: as noted in HPI Dermatological ROS: negative for rash and skin lesion changes  Physical exam: pleasant female in no apparent distress. Blood pressure 177/73, pulse 96, temperature 98.5 F (36.9 C), temperature source Oral, resp. rate 14, height $RemoveBe'5\' 6"'HvGfBOxkF$  (1.676 m), weight 80.151 kg (176 lb 11.2 oz), SpO2 98.00%. Head: normocephalic. Neck: supple, no bruits, no JVD. Cardiac: no murmurs. Lungs: clear. Abdomen: soft, no tender, no mass. Extremities: bilateral pitting edema without clubbing or cyanosis. Neurologic Examination:                                                                                                      General: Mental Status: Alert, oriented, thought content appropriate.  Speech fluent without evidence of aphasia.  Able to follow 3 step commands without difficulty. Cranial Nerves: II: Discs flat bilaterally; Visual fields grossly normal, pupils equal, round, reactive to light and accommodation III,IV, VI:  ptosis not present, extra-ocular motions intact bilaterally V,VII: smile symmetric, facial light touch sensation normal bilaterally VIII: hearing normal bilaterally IX,X: gag reflex present XI: bilateral shoulder shrug XII: midline tongue extension without atrophy or fasciculations Motor: Significant for mild weakness involving mainly left hip flexors, knee extensors, and dorsiflexors. Tone and bulk:normal tone throughout; no atrophy noted Sensory: Pinprick and light touch mildly impaired bilaterally Deep Tendon Reflexes:  1+ upper extremities. Diminished knee jerks, and couldn't elicit ankle jerks.  Plantars: Right: upgoing   Left: downgoing Cerebellar: normal finger-to-nose,  normal heel-to-shin test Gait:  No tested.     Results for orders placed during the hospital encounter of  08/17/13 (from the past 48 hour(s))  CBC     Status: Abnormal   Collection Time    08/17/13  2:52 PM      Result Value Ref Range   WBC 9.7  4.0 - 10.5 K/uL   RBC 4.03  3.87 - 5.11 MIL/uL   Hemoglobin 11.4 (*) 12.0 - 15.0 g/dL   HCT 33.2 (*) 36.0 - 46.0 %   MCV 82.4  78.0 - 100.0 fL   MCH 28.3  26.0 - 34.0 pg   MCHC 34.3  30.0 - 36.0 g/dL   RDW 13.1  11.5 - 15.5 %   Platelets 281  150 - 400 K/uL  BASIC METABOLIC PANEL     Status: Abnormal   Collection Time    08/17/13  2:52 PM      Result Value Ref Range   Sodium 133 (*) 137 - 147 mEq/L   Potassium 4.4  3.7 - 5.3 mEq/L   Chloride 96  96 - 112 mEq/L   CO2 24  19 - 32 mEq/L   Glucose, Bld 466 (*) 70 - 99 mg/dL   BUN 23  6 - 23 mg/dL   Creatinine, Ser 1.65 (*) 0.50 - 1.10 mg/dL   Calcium 9.0  8.4 - 10.5 mg/dL   GFR calc non Af Amer 32 (*) >90 mL/min   GFR calc Af Amer 37 (*) >90 mL/min   Comment: (NOTE)     The eGFR has been calculated using the CKD EPI equation.     This calculation has not been validated in all clinical situations.     eGFR's persistently <90 mL/min signify possible Chronic Kidney     Disease.   Anion gap 13  5 - 15  CBG MONITORING, ED     Status: Abnormal   Collection Time    08/17/13  3:18 PM      Result Value Ref Range   Glucose-Capillary 433 (*) 70 - 99 mg/dL  I-STAT VENOUS BLOOD GAS, ED     Status: Abnormal   Collection Time    08/17/13  5:20 PM      Result Value Ref Range   pH, Ven 7.368 (*) 7.250 - 7.300   pCO2, Ven 44.0 (*) 45.0 - 50.0 mmHg   pO2, Ven 22.0 (*) 30.0 - 45.0 mmHg   Bicarbonate 25.3 (*) 20.0 - 24.0 mEq/L   TCO2 27  0 - 100 mmol/L   O2 Saturation 35.0     Sample type VENOUS     Comment NOTIFIED PHYSICIAN    URINALYSIS, ROUTINE W REFLEX MICROSCOPIC     Status: Abnormal   Collection Time    08/17/13  6:44 PM      Result Value Ref Range   Color, Urine YELLOW  YELLOW   APPearance CLEAR  CLEAR   Specific Gravity,  Urine 1.022  1.005 - 1.030   pH 6.0  5.0 - 8.0   Glucose, UA >1000  (*) NEGATIVE mg/dL   Hgb urine dipstick SMALL (*) NEGATIVE   Bilirubin Urine NEGATIVE  NEGATIVE   Ketones, ur NEGATIVE  NEGATIVE mg/dL   Protein, ur >300 (*) NEGATIVE mg/dL   Urobilinogen, UA 0.2  0.0 - 1.0 mg/dL   Nitrite POSITIVE (*) NEGATIVE   Leukocytes, UA NEGATIVE  NEGATIVE  URINE MICROSCOPIC-ADD ON     Status: Abnormal   Collection Time    08/17/13  6:44 PM      Result Value Ref Range   Squamous Epithelial / LPF FEW (*) RARE   WBC, UA 21-50  <3 WBC/hpf   RBC / HPF 0-2  <3 RBC/hpf   Bacteria, UA MANY (*) RARE  CBG MONITORING, ED     Status: Abnormal   Collection Time    08/17/13  6:46 PM      Result Value Ref Range   Glucose-Capillary 466 (*) 70 - 99 mg/dL  CBG MONITORING, ED     Status: Abnormal   Collection Time    08/17/13  9:20 PM      Result Value Ref Range   Glucose-Capillary 182 (*) 70 - 99 mg/dL  BASIC METABOLIC PANEL     Status: Abnormal   Collection Time    08/17/13 11:43 PM      Result Value Ref Range   Sodium 138  137 - 147 mEq/L   Potassium 3.7  3.7 - 5.3 mEq/L   Chloride 102  96 - 112 mEq/L   CO2 25  19 - 32 mEq/L   Glucose, Bld 107 (*) 70 - 99 mg/dL   BUN 20  6 - 23 mg/dL   Creatinine, Ser 1.47 (*) 0.50 - 1.10 mg/dL   Calcium 8.8  8.4 - 10.5 mg/dL   GFR calc non Af Amer 37 (*) >90 mL/min   GFR calc Af Amer 42 (*) >90 mL/min   Comment: (NOTE)     The eGFR has been calculated using the CKD EPI equation.     This calculation has not been validated in all clinical situations.     eGFR's persistently <90 mL/min signify possible Chronic Kidney     Disease.   Anion gap 11  5 - 15  HEPATIC FUNCTION PANEL     Status: Abnormal   Collection Time    08/17/13 11:43 PM      Result Value Ref Range   Total Protein 5.8 (*) 6.0 - 8.3 g/dL   Albumin 2.2 (*) 3.5 - 5.2 g/dL   AST 16  0 - 37 U/L   ALT 17  0 - 35 U/L   Alkaline Phosphatase 89  39 - 117 U/L   Total Bilirubin <0.2 (*) 0.3 - 1.2 mg/dL   Bilirubin, Direct <0.2  0.0 - 0.3 mg/dL   Indirect Bilirubin  NOT CALCULATED  0.3 - 0.9 mg/dL  TROPONIN I     Status: None   Collection Time    08/17/13 11:53 PM      Result Value Ref Range   Troponin I <0.30  <0.30 ng/mL   Comment:            Due to the release kinetics of cTnI,     a negative result within the first hours     of the onset of symptoms does not rule out     myocardial infarction with certainty.  If myocardial infarction is still suspected,     repeat the test at appropriate intervals.  AMMONIA     Status: None   Collection Time    08/17/13 11:53 PM      Result Value Ref Range   Ammonia 21  11 - 60 umol/L  TSH     Status: None   Collection Time    08/17/13 11:53 PM      Result Value Ref Range   TSH 3.070  0.350 - 4.500 uIU/mL   Dg Chest 2 View  08/17/2013   CLINICAL DATA:  Hypertension.  EXAM: CHEST  2 VIEW  COMPARISON:  None.  FINDINGS: Mediastinum and hilar structures normal. Tiny nodular density is noted projected over the left upper lobe at the level of the anterior left second rib. To exclude a true pulmonary nodule nonenhanced chest CT is suggested. Lungs are clear of acute infiltrates. No pleural effusion or pneumothorax. Heart size and pulmonary vascularity normal. Diffuse degenerative changes thoracic spine.  IMPRESSION: Tiny nodular density projected over the left upper lobe, this could represent a true pulmonary nodule and nonemergent nonenhanced chest CT is suggested for further evaluation   Electronically Signed   By: Marcello Moores  Register   On: 08/17/2013 16:36   Ct Head Wo Contrast  08/17/2013   CLINICAL DATA:  Facial tingling and slurred speech  EXAM: CT HEAD WITHOUT CONTRAST  TECHNIQUE: Contiguous axial images were obtained from the base of the skull through the vertex without intravenous contrast.  COMPARISON:  None.  FINDINGS: The bony calvarium is intact. The ventricles are of normal size and configuration. No findings to suggest acute hemorrhage, acute infarction or space-occupying mass lesion are noted.   IMPRESSION: No acute abnormality is noted.   Electronically Signed   By: Inez Catalina M.D.   On: 08/17/2013 20:35     Assessment: 63 y.o. female with bilateral LE weakness (progressive but worse in the past 2 weeks) and new complains of slurred speech (no evident on my exam) and bladder dysfunction. Has many risk factors for stroke and thus agree that MRI brain is warranted. On the other hand, she has a new complain of bladder impairment but without a cord findings on neuro-exam. Will add MRI thoracic spine to exclude the unlikely possibility of a concomitant cord process. Can not entirely exclude the possibility of worsening distal symmetrical sensory-motor diabetic neuropathy as the reason for worsening dysequilibrium, legs paresthesias/dysethesias/weakness (althogugh it will not explained patient's claim of having slurred speech). Continue aspirin. Complete stroke work up if MRI brain positive for stroke. PT. Will follow up.   Dorian Pod, MD [facility] [phone]  08/18/2013, 1:53 AM

## 2013-08-18 NOTE — H&P (Signed)
[facility] History and Physical  [name] HUD:[id number] DOB: Aug 11, 1950 DOA: 08/17/2013  Referring physician: ER physician. PCP: Georgetta Haber, MD   Chief Complaint: Weakness of the lower extremity.  HPI: [name] is a 64 y.o. female with history of hypertension and diabetes mellitus who has not been very compliant with her medications presents with complaints of weakness of the lower extremity. Patient in addition has been having right facial numbness and tingling of her upper extremities. Patient states that over the last 2 days patient has been feeling increasingly weak over the lower extremity more on the left lower extremity. With some slurred speech and tingling of the upper extremities. In the ER patient complained of some chest discomfort which was retrosternal pressure-like nonradiating and was given morphine for which patient's blood pressure dropped and patient became lethargic. Patient was given Narcan following which patient became more alert and awake. CT head was negative for anything acute. Given patient's risk factors patient has been admitted for further management. Patient's blood sugar initially on admission was 400. Patient was given NovoLog 20 units. Patient states that she has been not taking her medications regularly as she has not been following with her primary care physician for last few months. Patient's blood pressure is also found to be elevated. Patient on my exam states her chest pain has resolved. She had nausea and vomiting with some abdominal discomfort. Patient's nausea vomiting happen only after admission. Abdomen appears benign on exam.   Review of Systems: As presented in the history of presenting illness, rest negative.  Past Medical History  Diagnosis Date  . Hypertension   . Diabetes mellitus   . Hyperlipidemia   . History of noncompliance with medical treatment   . Shingles 08/18/2013   Past Surgical History  Procedure  Laterality Date  . Diabetes    . Fibroids of breast     Social History:  reports that she has never smoked. She has never used smokeless tobacco. She reports that she does not drink alcohol or use illicit drugs. Where does patient live home. Can patient participate in ADLs? Yes.  Allergies  Allergen Reactions  . Codeine     REACTION: Nausea Vomiting  . Grapefruit Concentrate     itching  . Peanut-Containing Drug Products     itching    Family History:  Family History  Problem Relation Age of Onset  . Diabetes    . Hypertension    . Hypertension Father   . Diabetes Father   . Hypertension Mother   . Diabetes Mother   . Diabetes Sister   . Hypertension Sister   . Diabetes Sister   . Hypertension Sister       Prior to Admission medications   Medication Sig Start Date End Date Taking? Authorizing Provider  amLODipine-olmesartan (AZOR) 5-40 MG per tablet Take 1 tablet by mouth daily.   Yes Historical Provider, MD  insulin glargine (LANTUS) 100 UNIT/ML injection Inject 20 Units into the skin 2 (two) times daily. Take 20 units in the morning and 20 units in the evening   Yes Historical Provider, MD  metFORMIN (GLUCOPHAGE) 500 MG tablet Take 500 mg by mouth 2 (two) times daily with a meal.   Yes Historical Provider, MD    Physical Exam: Filed Vitals:   08/17/13 2345 08/18/13 0030 08/18/13 0040 08/18/13 0043  BP: 144/66 214/92 177/73   Pulse: 96     Temp:      TempSrc:  Resp: 14     Height:      Weight:    80.151 kg (176 lb 11.2 oz)  SpO2: 98%  98% 98%     General:  Moderately built and nourished.  Eyes: Anicteric no pallor.  ENT: No discharge from the ears eyes nose mouth.  Neck: No mass felt.  Cardiovascular: S1-S2 heard.  Respiratory: No rhonchi or crepitations.  Abdomen: Soft nontender bowel sounds present. No guarding or rigidity.  Skin: No rash.  Musculoskeletal: No edema.  Psychiatric: Appears normal.  Neurologic: Alert awake oriented to  time place and person. There is left lower extremity weakness 4 x 5 rest of the extremities 5 x 5. Poor deep tendon reflexes. No facial asymmetry. Tongue is midline. PERRLA positive.  Labs on Admission:  Basic Metabolic Panel:  Recent Labs Lab 08/17/13 1452 08/17/13 2343  NA 133* 138  K 4.4 3.7  CL 96 102  CO2 24 25  GLUCOSE 466* 107*  BUN 23 20  CREATININE 1.65* 1.47*  CALCIUM 9.0 8.8   Liver Function Tests:  Recent Labs Lab 08/17/13 2343  AST 16  ALT 17  ALKPHOS 89  BILITOT <0.2*  PROT 5.8*  ALBUMIN 2.2*   No results found for this basename: LIPASE, AMYLASE,  in the last 168 hours  Recent Labs Lab 08/17/13 2353  AMMONIA 21   CBC:  Recent Labs Lab 08/17/13 1452  WBC 9.7  HGB 11.4*  HCT 33.2*  MCV 82.4  PLT 281   Cardiac Enzymes:  Recent Labs Lab 08/17/13 2353  TROPONINI <0.30    BNP (last 3 results) No results found for this basename: PROBNP,  in the last 8760 hours CBG:  Recent Labs Lab 08/17/13 1518 08/17/13 1846 08/17/13 2120  GLUCAP 433* 466* 182*    Radiological Exams on Admission: Dg Chest 2 View  08/17/2013   CLINICAL DATA:  Hypertension.  EXAM: CHEST  2 VIEW  COMPARISON:  None.  FINDINGS: Mediastinum and hilar structures normal. Tiny nodular density is noted projected over the left upper lobe at the level of the anterior left second rib. To exclude a true pulmonary nodule nonenhanced chest CT is suggested. Lungs are clear of acute infiltrates. No pleural effusion or pneumothorax. Heart size and pulmonary vascularity normal. Diffuse degenerative changes thoracic spine.  IMPRESSION: Tiny nodular density projected over the left upper lobe, this could represent a true pulmonary nodule and nonemergent nonenhanced chest CT is suggested for further evaluation   Electronically Signed   By: Marcello Moores  Register   On: 08/17/2013 16:36   Ct Head Wo Contrast  08/17/2013   CLINICAL DATA:  Facial tingling and slurred speech  EXAM: CT HEAD WITHOUT  CONTRAST  TECHNIQUE: Contiguous axial images were obtained from the base of the skull through the vertex without intravenous contrast.  COMPARISON:  None.  FINDINGS: The bony calvarium is intact. The ventricles are of normal size and configuration. No findings to suggest acute hemorrhage, acute infarction or space-occupying mass lesion are noted.  IMPRESSION: No acute abnormality is noted.   Electronically Signed   By: Inez Catalina M.D.   On: 08/17/2013 20:35    EKG: Independently reviewed. Normal sinus rhythm with LVH changes and secondary repolarization changes.  Assessment/Plan Principal Problem:   Lower extremity weakness Active Problems:   Chest pain   Diabetes mellitus type 2, uncontrolled   Accelerated hypertension   Weakness of both lower extremities   1. Lower extremity weakness with facial numbness and dysarthria - at this  time patient's symptoms are concerning for stroke. MRI of the brain has been ordered and neurologist on call has been consulted. Patient has been placed on swallow evaluation and neurochecks. Further recommendations per neurologist. 2. Chest pain - presently chest pain-free. Patient's blood pressure was elevated on admission. Patient has not been taking her blood pressure medications. Cycle cardiac markers. Control blood pressure. If patient's chest pain recurs may need CT angiogram of the chest. For now patient will be on aspirin. Check 2-D echo. 3. Accelerated hypertension - patient is on ARB and amlodipine. Not sure if patient has been compliant. For now I have continued on her medication and place patient on when necessary IV labetalol. See #2. 4. Diabetes mellitus type 2 uncontrolled - patient admits to noncompliance with medications. Check hemoglobin A1c. Patient states she takes Lantus initially which I have resumed. Hold metformin for now given the renal failure. 5. Probable chronic renal failure stage III - closely follow metabolic panel since patient is on  ARB. 6. Possible UTI - patient is on ceftriaxone. Check urine cultures. 7. Mild anemia - check anemia panel. Follow CBC. There is no significant fall in hemoglobin further workup as outpatient.    Code Status: Full code.  Family Communication: None.  Disposition Plan: Admit to inpatient.    Maitland Lesiak N. [facility] Pager [phone]. If 7PM-7AM, please contact night-coverage [email or web address] Password [email or web address] 08/18/2013, 1:26 AM

## 2013-08-18 NOTE — Progress Notes (Signed)
Patient admitted earlier this morning for intermittent lower extremity weakness ongoing for 2 years, patient says these episodes seems to be related to underlying stress and she requests psychiatric evaluation.   Neurology following the patient, MRI brain and T-spine pending to rule out CVA, psych has been consulted as well.

## 2013-08-18 NOTE — Progress Notes (Signed)
Utilization review completed. Damere Brandenburg, RN, BSN. 

## 2013-08-18 NOTE — Progress Notes (Signed)
Report called to [place], RN on 3W. Pt will be transferred after shift change.  Will continue to monitor. Roselyn Reef Lissa Rowles,RN

## 2013-08-18 NOTE — Evaluation (Signed)
Speech Language Pathology Evaluation Patient Details Name: [name] MRN: [id number] DOB: 06-Dec-1950 Today's Date: 08/18/2013 Time: 1937-9024 SLP Time Calculation (min): 15 min  Problem List:  Patient Active Problem List   Diagnosis Date Noted  . Lower extremity weakness 08/18/2013  . Diabetes mellitus type 2, uncontrolled 08/18/2013  . Accelerated hypertension 08/18/2013  . Weakness of both lower extremities 08/18/2013  . Chest pain 08/17/2013  . Post herpetic neuralgia 06/25/2012  . Type II or unspecified type diabetes mellitus with neurological manifestations, not stated as uncontrolled(250.60) 06/25/2012  . ATYPICAL DEPRESSIVE DISORDER 12/31/2009  . THORACIC/LUMBOSACRAL NEURITIS/RADICULITIS UNSPEC 04/24/2008  . DM W/MANIFESTATION NEC, TYPE II, UNCONTROLLED 09/04/2006  . HYPERLIPIDEMIA 09/04/2006  . HYPERTENSION 09/01/2006   Past Medical History:  Past Medical History  Diagnosis Date  . Hypertension   . Diabetes mellitus   . Hyperlipidemia   . History of noncompliance with medical treatment   . Shingles 08/18/2013   Past Surgical History:  Past Surgical History  Procedure Laterality Date  . Diabetes    . Fibroids of breast     HPI:  63 y.o.  with PMH:  hypertension and diabetes mellitus, medical non compliance with medications admitted with complaints of weakness of the lower extremity, right facial numbness and tingling of her upper extremities. Per MD note pt. complained of some chest discomfort given morphine patient's blood pressure and became lethargic. CT head was negative for anything acute.   Assessment / Plan / Recommendation Clinical Impression  Pt. seen for speech-language-cognition abilities without family present therefore difficult to determine baseline status.  Working Marine scientist WFL's (4 words and recalled visitors and upcoming tests) as well as orientation, intellectual awareness.  She required min prompting for one of tow verbal problem solving  scenarios.  Speech is intelligible at conversational level.  Pt. appears functional in the acute setting.  She may benefit from higher level assessment at next venue to care.  No further follow up recommended at present.    SLP Assessment  All further Speech Lanaguage Pathology  needs can be addressed in the next venue of care    Follow Up Recommendations  Inpatient Rehab (or home health)    Frequency and Duration        Pertinent Vitals/Pain No pain       SLP Evaluation Prior Functioning  Cognitive/Linguistic Baseline: Information not available Type of Home: House (2 level)  Lives With: Spouse (and grandchildren) Vocation: Retired   Associate Professor  Overall Cognitive Status: Difficult to assess (assume at baseline ?) Arousal/Alertness: Awake/alert Orientation Level: Oriented X4 Attention: Sustained Sustained Attention: Appears intact Memory: Appears intact Awareness: Appears intact Problem Solving: Impaired Problem Solving Impairment: Verbal basic Safety/Judgment: Appears intact    Comprehension  Auditory Comprehension Overall Auditory Comprehension: Appears within functional limits for tasks assessed Visual Recognition/Discrimination Discrimination: Not tested Reading Comprehension Reading Status: Not tested    Expression Expression Primary Mode of Expression: Verbal Verbal Expression Overall Verbal Expression: Appears within functional limits for tasks assessed Pragmatics: No impairment Written Expression Dominant Hand: Right Written Expression: Not tested   Oral / Motor Oral Motor/Sensory Function Overall Oral Motor/Sensory Function: Appears within functional limits for tasks assessed Motor Speech Overall Motor Speech: Appears within functional limits for tasks assessed Intelligibility: Intelligible Motor Planning: Witnin functional limits   GO     Orbie Pyo [employer] [phone]  08/18/2013

## 2013-08-18 NOTE — Progress Notes (Signed)
Pt returned from MRI at 2100 alert and calm. Assisted to bathroom. Pt and belongings transferred to unit 3W11 at 2110 via wheelchair.  Johnsie Cancel, RN
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
## 2013-08-18 NOTE — Progress Notes (Signed)
Pt waiting for diet order. Two text pages and one regular page sent to Dr. Candiss Norse over the course of the day. Awaiting orders. Will continue to monitor. Roselyn Reef Jaycion Treml,RN

## 2013-08-18 NOTE — Progress Notes (Signed)
Echocardiogram 2D Echocardiogram has been performed.  [name] 08/18/2013, 4:00 PM

## 2013-08-19 LAB — BASIC METABOLIC PANEL
Anion gap: 9 (ref 5–15)
BUN: 11 mg/dL (ref 6–23)
CO2: 28 mEq/L (ref 19–32)
Calcium: 8.8 mg/dL (ref 8.4–10.5)
Chloride: 102 mEq/L (ref 96–112)
Creatinine, Ser: 1.41 mg/dL — ABNORMAL HIGH (ref 0.50–1.10)
GFR calc Af Amer: 45 mL/min — ABNORMAL LOW (ref >90)
GFR, EST NON AFRICAN AMERICAN: 38 mL/min — AB (ref >90)
Glucose, Bld: 141 mg/dL — ABNORMAL HIGH (ref 70–99)
Potassium: 3.8 mEq/L (ref 3.7–5.3)
SODIUM: 139 meq/L (ref 137–147)

## 2013-08-19 LAB — GLUCOSE, CAPILLARY
GLUCOSE-CAPILLARY: 145 mg/dL — AB (ref 70–99)
Glucose-Capillary: 267 mg/dL — ABNORMAL HIGH (ref 70–99)
Glucose-Capillary: 176 mg/dL — ABNORMAL HIGH (ref 70–99)
Glucose-Capillary: 206 mg/dL — ABNORMAL HIGH (ref 70–99)

## 2013-08-19 MED ORDER — CARVEDILOL 3.125 MG PO TABS
3.1250 mg | ORAL_TABLET | Freq: Two times a day (BID) | ORAL | Status: DC
  Administered 2013-08-19 – 2013-08-20 (×2): 3.125 mg via ORAL
  Filled 2013-08-19 (×4): qty 1

## 2013-08-19 MED ORDER — SODIUM CHLORIDE 0.9 % IV SOLN
INTRAVENOUS | Status: AC
  Administered 2013-08-19: 1000 mL via INTRAVENOUS

## 2013-08-19 MED ORDER — INSULIN GLARGINE 100 UNIT/ML ~~LOC~~ SOLN
22.0000 [IU] | Freq: Two times a day (BID) | SUBCUTANEOUS | Status: DC
  Administered 2013-08-19 – 2013-08-20 (×3): 22 [IU] via SUBCUTANEOUS
  Filled 2013-08-19 (×4): qty 0.22

## 2013-08-19 MED ORDER — INSULIN GLARGINE 100 UNIT/ML ~~LOC~~ SOLN
30.0000 [IU] | Freq: Two times a day (BID) | SUBCUTANEOUS | Status: DC
  Filled 2013-08-19 (×2): qty 0.3

## 2013-08-19 MED ORDER — ATORVASTATIN CALCIUM 10 MG PO TABS
10.0000 mg | ORAL_TABLET | Freq: Every day | ORAL | Status: DC
  Filled 2013-08-19 (×2): qty 1

## 2013-08-19 NOTE — Progress Notes (Signed)
Pt refuses Lipitor stating it causes her to have severe rectal bleeding.

## 2013-08-19 NOTE — Progress Notes (Signed)
Spoke with patient and sister regarding inability to afford insulin.  Assisted patient in the activation of "Lantus" medication assistance card.  This card decreases co-pay for Lantus to 25$ a Rx.  She was very Patent attorney. Discussed use of Lantus pen including storage and when to discard (after 28 days).  She has used lantus pen in the past but states that she would take less insulin than prescribed and often skip days to make it last longer.  Discussed monitoring.  She states that her meter does not work.  She will need Rx. For glucose meter (30047), Lantus Solostar pen, and insulin pen needles at discharge.  She also is agreeable to attend outpatient diabetes education classes.  [facility].  She seems to understand the important of caring for herself and her health.  Thanks, Adah Perl, RN, BC-ADM Inpatient Diabetes Coordinator Pager [phone]

## 2013-08-19 NOTE — Evaluation (Signed)
Physical Therapy Evaluation Patient Details Name: [name] MRN: [id number] DOB: November 26, 1950 Today's Date: 08/19/2013   History of Present Illness  [name] is a 63 y.o. female with history of hypertension and diabetes mellitus who has not been very compliant with her medications presents with complaints of weakness of the lower extremity. CT and MRI (-), UTI  Clinical Impression  Pt pleasant and ready to move on arrival. Pt unsteady with cane use and improved with RW with continued recommendation for RW use at all times. Pt agitated with ADLs when not performed her way as pt wanting to wash out cloths at toilet although no need since they can go in laundry bag but pt very adamant. Sister present throughout and states pt with mental health issues with agitation episodes at baseline. Pt will benefit from additional therapy at home to maximize balance, safety and function with decreased caregiver support. Pt and sister deny any possibility of sT-SNF and therefore recommend HHPT. Will follow acutely to increase strength and function.     Follow Up Recommendations Home health PT;Supervision for mobility/OOB    Equipment Recommendations  Rolling walker with 5" wheels;3in1 (PT) (pt requesting 3in1)    Recommendations for Other Services       Precautions / Restrictions Precautions Precautions: Fall Restrictions Weight Bearing Restrictions: No      Mobility  Bed Mobility Overal bed mobility: Needs Assistance Bed Mobility: Supine to Sit     Supine to sit: Supervision     General bed mobility comments: cues for safety with IV  Transfers Overall transfer level: Modified independent               General transfer comment: bed, toilet and chair  Ambulation/Gait Ambulation/Gait assistance: Supervision Ambulation Distance (Feet): 100 Feet Assistive device: Rolling walker (2 wheeled);Straight cane Gait Pattern/deviations: Step-through pattern;Decreased stride  length   Gait velocity interpretation: Below normal speed for age/gender General Gait Details: Pt normally uses cane but with cane use unsteady and reaching out for environmental supports. With RW much steadier with cues for posture and position in RW. Quickly fatigued with gait  Stairs            Wheelchair Mobility    Modified Rankin (Stroke Patients Only)       Balance Overall balance assessment: Needs assistance   Sitting balance-Leahy Scale: Good       Standing balance-Leahy Scale: Fair                               Pertinent Vitals/Pain No pain    Home Living Family/patient expects to be discharged to:: Private residence Living Arrangements: Spouse/significant other;Other relatives Available Help at Discharge: Family;Available PRN/intermittently Type of Home: House       Home Layout: Two level;Bed/bath upstairs Home Equipment: Cane - single point      Prior Function Level of Independence: Independent         Comments: pt states she cooks, drives, performs ADLs on her own. Spouse is gone all day til 9pm per sister to get away from pt. Grandchildren are there and leave at 1 and 3 pm     Hand Dominance        Extremity/Trunk Assessment   Upper Extremity Assessment: Overall WFL for tasks assessed           Lower Extremity Assessment: Overall WFL for tasks assessed (bil LE all myotomes 4-4+/5 with difficulty following instructions to  resist movement)      Cervical / Trunk Assessment: Normal  Communication   Communication: No difficulties  Cognition Arousal/Alertness: Awake/alert Behavior During Therapy: Agitated Overall Cognitive Status: History of cognitive impairments - at baseline Area of Impairment: Safety/judgement;Problem solving;Following commands       Following Commands: Follows one step commands inconsistently Safety/Judgement: Decreased awareness of safety;Decreased awareness of deficits   Problem Solving:  Requires verbal cues General Comments: Sister reports baseline "mental health issues" and that she is on medication. Pt quickly agitated when activities not performed her way.     General Comments      Exercises        Assessment/Plan    PT Assessment Patient needs continued PT services  PT Diagnosis Difficulty walking;Altered mental status   PT Problem List Decreased activity tolerance;Decreased balance;Decreased knowledge of use of DME;Decreased cognition  PT Treatment Interventions Gait training;DME instruction;Stair training;Functional mobility training;Therapeutic exercise;Therapeutic activities;Patient/family education;Cognitive remediation;Balance training   PT Goals (Current goals can be found in the Care Plan section) Acute Rehab PT Goals Patient Stated Goal: go home PT Goal Formulation: With patient/family Time For Goal Achievement: 09/02/13 Potential to Achieve Goals: Fair    Frequency Min 3X/week   Barriers to discharge Decreased caregiver support      Co-evaluation               End of Session   Activity Tolerance: Patient tolerated treatment well Patient left: in chair;with call bell/phone within reach;with chair alarm set;with family/visitor present Nurse Communication: Mobility status;Precautions         Time: 4562-5638 PT Time Calculation (min): 36 min   Charges:   PT Evaluation $Initial PT Evaluation Tier I: 1 Procedure PT Treatments $Gait Training: 8-22 mins $Therapeutic Activity: 8-22 mins   PT G Codes:          Melford Aase 08/19/2013, 9:37 AM  Elwyn Reach, [place]

## 2013-08-19 NOTE — Progress Notes (Signed)
Subjective: Feels her speech has cleared.  Has multiple complaints of joint pain, muscle cramps, intermittent HA, mouth movements. Denies being on antipsychotic in past. States Klonopin has helped with anxiety.   Objective: Current vital signs: BP 176/69  Pulse 95  Temp(Src) 98.3 F (36.8 C) (Oral)  Resp 18  Ht 5\' 6"  (1.676 m)  Wt 80.6 kg (177 lb 11.1 oz)  BMI 28.69 kg/m2  SpO2 100% Vital signs in last 24 hours: Temp:  [97.9 F (36.6 C)-99 F (37.2 C)] 98.3 F (36.8 C) (07/31 0742) Pulse Rate:  [82-95] 95 (07/31 0500) Resp:  [16-20] 18 (07/31 0742) BP: (148-188)/(69-140) 176/69 mmHg (07/31 0742) SpO2:  [97 %-100 %] 100 % (07/31 0742) Weight:  [80.6 kg (177 lb 11.1 oz)] 80.6 kg (177 lb 11.1 oz) (07/31 0500)  Intake/Output from previous day: 07/30 0701 - 07/31 0700 In: 640 [P.O.:240; I.V.:400] Out: 900 [Urine:900] Intake/Output this shift: Total I/O In: 200 [P.O.:200] Out: -  Nutritional status:    Neurologic Exam: General: Mental Status: Alert, oriented, thought content appropriate.  Speech fluent without evidence of aphasia.  Able to follow 3 step commands without difficulty. Cranial Nerves: II:  Visual fields grossly normal, pupils equal, round, reactive to light and accommodation III,IV, VI: ptosis not present, extra-ocular motions intact bilaterally V,VII: smile symmetric, facial light touch sensation normal bilaterally, while at rest has tongue writhing like movements which she states she cannot control.  The become less frequent when asked to do multiple commands.  VIII: hearing normal bilaterally IX,X: gag reflex present XI: bilateral shoulder shrug XII: midline tongue extension without atrophy or fasciculations  Motor: Right : Upper extremity   5/5    Left:     Upper extremity   5/5  Lower extremity   5/5     Lower extremity   5/5 Tone and bulk:normal tone throughout; no atrophy noted Sensory: Pinprick and light touch intact throughout, bilaterally but has  decreased vibratory and temperature sensation from foot to knee.  Deep Tendon Reflexes:  Right: Upper Extremity   Left: Upper extremity   biceps (C-5 to C-6) 2/4   biceps (C-5 to C-6) 2/4 tricep (C7) 2/4    triceps (C7) 2/4 Brachioradialis (C6) 2/4  Brachioradialis (C6) 2/4  Lower Extremity Lower Extremity  quadriceps (L-2 to L-4) 1/4   quadriceps (L-2 to L-4) 1/4 Achilles (S1) 0/4   Achilles (S1) 0/4  Plantars: Right: downgoing   Left: downgoing Cerebellar: normal finger-to-nose,  normal heel-to-shin test Gait: unsteady but improved with RW with PT. PT recommending HHPT CV: pulses palpable throughout    Lab Results: Basic Metabolic Panel:  Recent Labs Lab 08/17/13 1452 08/17/13 2343 08/18/13 0244 08/19/13 0805  NA 133* 138 136* 139  K 4.4 3.7 3.8 3.8  CL 96 102 101 102  CO2 24 25 23 28   GLUCOSE 466* 107* 152* 141*  BUN 23 20 20 11   CREATININE 1.65* 1.47* 1.41* 1.41*  CALCIUM 9.0 8.8 8.8 8.8    Liver Function Tests:  Recent Labs Lab 08/17/13 2343 08/18/13 0244  AST 16 19  ALT 17 15  ALKPHOS 89 89  BILITOT <0.2* <0.2*  PROT 5.8* 5.8*  ALBUMIN 2.2* 2.1*   No results found for this basename: LIPASE, AMYLASE,  in the last 168 hours  Recent Labs Lab 08/17/13 2353  AMMONIA 21    CBC:  Recent Labs Lab 08/17/13 1452 08/18/13 0244  WBC 9.7 10.1  NEUTROABS  --  7.4  HGB 11.4* 10.7*  HCT  33.2* 31.7*  MCV 82.4 83.4  PLT 281 227    Cardiac Enzymes:  Recent Labs Lab 08/17/13 2353 08/18/13 0244 08/18/13 0820 08/18/13 1345  TROPONINI <0.30 <0.30 <0.30 <0.30    Lipid Panel:  Recent Labs Lab 08/18/13 0244  CHOL 287*  TRIG 311*  HDL 32*  CHOLHDL 9.0  VLDL 62*  LDLCALC 193*    CBG:  Recent Labs Lab 08/18/13 0743 08/18/13 1237 08/18/13 1659 08/18/13 2153 08/19/13 0738  GLUCAP 109* 150* 159* 305* 145*    Microbiology: Results for orders placed during the hospital encounter of 08/17/13  MRSA PCR SCREENING     Status: None    Collection Time    08/17/13  7:30 PM      Result Value Ref Range Status   MRSA by PCR NEGATIVE  NEGATIVE Final   Comment:            The GeneXpert MRSA Assay (FDA     approved for NASAL specimens     only), is one component of a     comprehensive MRSA colonization     surveillance program. It is not     intended to diagnose MRSA     infection nor to guide or     monitor treatment for     MRSA infections.    Coagulation Studies: No results found for this basename: LABPROT, INR,  in the last 72 hours  Imaging: Dg Chest 2 View  08/17/2013   CLINICAL DATA:  Hypertension.  EXAM: CHEST  2 VIEW  COMPARISON:  None.  FINDINGS: Mediastinum and hilar structures normal. Tiny nodular density is noted projected over the left upper lobe at the level of the anterior left second rib. To exclude a true pulmonary nodule nonenhanced chest CT is suggested. Lungs are clear of acute infiltrates. No pleural effusion or pneumothorax. Heart size and pulmonary vascularity normal. Diffuse degenerative changes thoracic spine.  IMPRESSION: Tiny nodular density projected over the left upper lobe, this could represent a true pulmonary nodule and nonemergent nonenhanced chest CT is suggested for further evaluation   Electronically Signed   By: Marcello Moores  Register   On: 08/17/2013 16:36   Ct Head Wo Contrast  08/17/2013   CLINICAL DATA:  Facial tingling and slurred speech  EXAM: CT HEAD WITHOUT CONTRAST  TECHNIQUE: Contiguous axial images were obtained from the base of the skull through the vertex without intravenous contrast.  COMPARISON:  None.  FINDINGS: The bony calvarium is intact. The ventricles are of normal size and configuration. No findings to suggest acute hemorrhage, acute infarction or space-occupying mass lesion are noted.  IMPRESSION: No acute abnormality is noted.   Electronically Signed   By: Inez Catalina M.D.   On: 08/17/2013 20:35   Mr Brain Wo Contrast  08/18/2013   CLINICAL DATA:  Right facial numbness,  upper extremity tingling, and lower extremity weakness. Stroke.  EXAM: MRI HEAD WITHOUT CONTRAST  TECHNIQUE: Multiplanar, multiecho pulse sequences of the brain and surrounding structures were obtained without intravenous contrast.  COMPARISON:  Head CT 08/17/2013  FINDINGS: There is no evidence of acute infarct, intracranial hemorrhage, mass, midline shift, or extra-axial fluid collection. Small foci of T2 hyperintensity in the subcortical and periventricular white matter are nonspecific but compatible with mild chronic small vessel ischemic disease. There is mild cerebral atrophy.  Orbits are unremarkable. Left maxillary sinus mucous retention cyst is noted. Mastoid air cells are clear. Major intracranial vascular flow voids are preserved.  IMPRESSION: 1. No acute intracranial  abnormality. 2. Mild chronic small vessel ischemic disease and cerebral atrophy.   Electronically Signed   By: Logan Bores   On: 08/18/2013 20:47   Mr Thoracic Spine Wo Contrast  08/18/2013   CLINICAL DATA:  Bilateral lower extremity weakness with bladder impairment.  EXAM: MRI THORACIC SPINE WITHOUT CONTRAST  TECHNIQUE: Multiplanar, multisequence MR imaging of the thoracic spine was performed. No intravenous contrast was administered.  COMPARISON:  Chest radiograph 08/17/2013  FINDINGS: Images are mildly degraded by motion.  There is slight upper thoracic levoscoliosis. There is no listhesis. Vertebral body heights are preserved without compression fracture. Vertebral bone marrow signal is within normal limits. The thoracic spinal cord is normal in caliber and grossly normal in signal allowing for motion. Conus medullaris terminates at L1. There is mild lower thoracic facet arthrosis, most prominently on the right at T9-10. There is mild bilateral neural foraminal narrowing at T9-10 and T10-11. No disc herniation or spinal canal stenosis is seen. There is no spinal cord compression.  There are small bilateral pleural effusions. T2  hyperintense lesions are partially visualized in the upper pole the right kidney, possibly cyst but incompletely evaluated.  IMPRESSION: 1. No thoracic spinal canal stenosis or cord compression. 2. Lower thoracic facet arthrosis with mild neural foraminal narrowing as above. 3. Small bilateral pleural effusions.   Electronically Signed   By: Logan Bores   On: 08/18/2013 20:55    Medications:  Scheduled: . irbesartan  300 mg Oral Daily   And  . amLODipine  5 mg Oral Daily  . aspirin  300 mg Rectal Daily   Or  . aspirin  325 mg Oral Daily  . cefTRIAXone (ROCEPHIN)  IV  1 g Intravenous Q24H  . clonazePAM  0.5 mg Oral QHS  . enoxaparin (LOVENOX) injection  40 mg Subcutaneous Daily  . escitalopram  5 mg Oral Daily  . insulin aspart  0-5 Units Subcutaneous QHS  . insulin aspart  0-9 Units Subcutaneous TID WC  . insulin glargine  22 Units Subcutaneous BID  . ondansetron (ZOFRAN) IV  4 mg Intravenous 4 times per day    Etta Quill PA-C [facility] [phone]  08/19/2013, 9:51 AM  Patient seen and examined.  Clinical course and management discussed.  Necessary edits performed.  I agree with the above.  Assessment and plan of care developed and discussed below.    Assessment/Plan:  63 y.o. female with bilateral LE weakness (progressive but worse in the past 2 weeks) and new complains of slurred speech (not present on initial exam or follow up exam) and bladder dysfunction. MRI brain and thoracic cord reviewed and show no abnormalities to explain her gait or bladder dysfunction. PT evaluated and feel she would benefit from HHPT. 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  Recommendations: 1. No further neurologic intervention is recommended at this time.  If further questions arise, please call or page  at that time.  Thank you for allowing neurology to participate in the care of this patient.  Alexis Goodell, MD [facility] [phone] 08/19/2013  3:40 PM

## 2013-08-19 NOTE — Progress Notes (Signed)
Patient Demographics  [name], is a 63 y.o. female, DOB - 25-Jun-1950, AJO:[id number]  Admit date - 08/17/2013   Admitting Physician Rise Patience, MD  Outpatient Primary MD for the patient is Patty Haber, MD  LOS - 2   Chief Complaint  Patient presents with  . Stroke Symptoms        Subjective:   [name] today has, No headache, No chest pain, No abdominal pain - No Nausea, No new weakness tingling or numbness, No Cough - SOB. Still has lower extremity weakness left more than right but better than before  Assessment & Plan    1. Intermittent left more than right lower extremity weakness ongoing for about 2 years. Episodes related to moments of stress per patient. MRI brain and T-spine unremarkable, CT head unremarkable, neurology is following the patient, question if she has severe peripheral neuropathy due to uncontrolled diabetes mellitus A1c was 16, will have PT evaluate and defer neurology for further workup.   2. Extreme anxiety and episodes of lower extremity weakness worse during stress. In life per patient. Psych consulted. On Lexapro and Klonopin per psych.    3. Hypertension, on ARB Norvasc and will add Coreg for better control.    4.Dm2 - extremely poor control A1c was 16. Have adjusted Lantus and sliding scale. Question outpatient compliance, monitor.  CBG (last 3)   Recent Labs  08/18/13 1659 08/18/13 2153 08/19/13 0738  GLUCAP 159* 305* 145*    Lab Results  Component Value Date   HGBA1C 16.8* 08/17/2013      5. UTI. On Rocephin and monitor cultures.    6. Dyslipidemia. Place on statin.    7. 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 Compensated. Placed on Coreg, on ARB. Outpatient cardiology followup. No chest pain or shortness  of breath.      Code Status: Full  Family Communication:  None  Disposition Plan: Home   Procedures MRI brain and T-spine, CT head, echogram, carotid duplex  TTE  - Left ventricle: The cavity size was normal. Wall thickness was increased in a pattern of mild LVH. Systolic function was mildly to moderately reduced. The estimated ejection fraction was in the range of 40% to 45%. Hypokinesis of the inferolateral myocardium. Doppler parameters are consistent with abnormal left ventricular relaxation (grade 1 diastolic dysfunction). - Mitral valve: There was moderate regurgitation. - Left atrium: The atrium was mildly dilated.    Consults  Neuro   Medications  Scheduled Meds: . irbesartan  300 mg Oral Daily   And  . amLODipine  5 mg Oral Daily  . aspirin  325 mg Oral Daily  . cefTRIAXone (ROCEPHIN)  IV  1 g Intravenous Q24H  . clonazePAM  0.5 mg Oral QHS  . enoxaparin (LOVENOX) injection  40 mg Subcutaneous Daily  . escitalopram  5 mg Oral Daily  . insulin aspart  0-5 Units Subcutaneous QHS  . insulin aspart  0-9 Units Subcutaneous TID WC  . insulin glargine  22 Units Subcutaneous BID  . ondansetron (ZOFRAN) IV  4 mg Intravenous 4 times per day   Continuous Infusions: . sodium chloride 1,000 mL (08/19/13 0810)   PRN Meds:.acetaminophen, hydrALAZINE, labetalol, MUSCLE RUB, senna-docusate  DVT Prophylaxis  Lovenox  Lab Results  Component Value Date   PLT 227 08/18/2013    Antibiotics     Anti-infectives   Start     Dose/Rate Route Frequency Ordered Stop   08/18/13 1800  cefTRIAXone (ROCEPHIN) 1 g in dextrose 5 % 50 mL IVPB     1 g 100 mL/hr over 30 Minutes Intravenous Every 24 hours 08/18/13 0255     08/17/13 1930  cefTRIAXone (ROCEPHIN) 1 g in dextrose 5 % 50 mL IVPB     1 g 100 mL/hr over 30 Minutes Intravenous  Once 08/17/13 1920 08/17/13 2000          Objective:   Filed Vitals:   08/18/13 2129 08/19/13 0200 08/19/13 0500 08/19/13 0742  BP:  148/77 164/83 188/90 176/69  Pulse: 82 91 95   Temp: 98.2 F (36.8 C) 98 F (36.7 C) 98 F (36.7 C) 98.3 F (36.8 C)  TempSrc: Oral Oral Oral Oral  Resp: 18 20 20 18   Height:      Weight:   80.6 kg (177 lb 11.1 oz)   SpO2: 99% 100% 97% 100%    Wt Readings from Last 3 Encounters:  08/19/13 80.6 kg (177 lb 11.1 oz)  04/25/13 84.369 kg (186 lb)  11/22/12 78.019 kg (172 lb)     Intake/Output Summary (Last 24 hours) at 08/19/13 1001 Last data filed at 08/19/13 0903  Gross per 24 hour  Intake    440 ml  Output    600 ml  Net   -160 ml     Physical Exam  Awake Alert, Oriented X 3, No new F.N deficits, mild left leg weakness as compared to right strength testing 5 over 5, Normal affect [place].AT,PERRAL Supple Neck,No JVD, No cervical lymphadenopathy appriciated.  Symmetrical Chest wall movement, Good air movement bilaterally, CTAB RRR,No Gallops,Rubs or new Murmurs, No Parasternal Heave +ve B.Sounds, Abd Soft, No tenderness, No organomegaly appriciated, No rebound - guarding or rigidity. No Cyanosis, Clubbing or edema, No new Rash or bruise      Data Review   Micro Results Recent Results (from the past 240 hour(s))  MRSA PCR SCREENING     Status: None   Collection Time    08/17/13  7:30 PM      Result Value Ref Range Status   MRSA by PCR NEGATIVE  NEGATIVE Final   Comment:            The GeneXpert MRSA Assay (FDA     approved for NASAL specimens     only), is one component of a     comprehensive MRSA colonization     surveillance program. It is not     intended to diagnose MRSA     infection nor to guide or     monitor treatment for     MRSA infections.    Radiology Reports Dg Chest 2 View  08/17/2013   CLINICAL DATA:  Hypertension.  EXAM: CHEST  2 VIEW  COMPARISON:  None.  FINDINGS: Mediastinum and hilar structures normal. Tiny nodular density is noted projected over the left upper lobe at the level of the anterior left second rib. To exclude a true pulmonary  nodule nonenhanced chest CT is suggested. Lungs are clear of acute infiltrates. No pleural effusion or pneumothorax. Heart size and pulmonary vascularity normal. Diffuse degenerative changes thoracic spine.  IMPRESSION: Tiny nodular density projected over the left upper lobe, this could represent a true pulmonary nodule and nonemergent nonenhanced chest CT is suggested for  further evaluation   Electronically Signed   By: Marcello Moores  Register   On: 08/17/2013 16:36   Ct Head Wo Contrast  08/17/2013   CLINICAL DATA:  Facial tingling and slurred speech  EXAM: CT HEAD WITHOUT CONTRAST  TECHNIQUE: Contiguous axial images were obtained from the base of the skull through the vertex without intravenous contrast.  COMPARISON:  None.  FINDINGS: The bony calvarium is intact. The ventricles are of normal size and configuration. No findings to suggest acute hemorrhage, acute infarction or space-occupying mass lesion are noted.  IMPRESSION: No acute abnormality is noted.   Electronically Signed   By: Inez Catalina M.D.   On: 08/17/2013 20:35   Mr Brain Wo Contrast  08/18/2013   CLINICAL DATA:  Right facial numbness, upper extremity tingling, and lower extremity weakness. Stroke.  EXAM: MRI HEAD WITHOUT CONTRAST  TECHNIQUE: Multiplanar, multiecho pulse sequences of the brain and surrounding structures were obtained without intravenous contrast.  COMPARISON:  Head CT 08/17/2013  FINDINGS: There is no evidence of acute infarct, intracranial hemorrhage, mass, midline shift, or extra-axial fluid collection. Small foci of T2 hyperintensity in the subcortical and periventricular white matter are nonspecific but compatible with mild chronic small vessel ischemic disease. There is mild cerebral atrophy.  Orbits are unremarkable. Left maxillary sinus mucous retention cyst is noted. Mastoid air cells are clear. Major intracranial vascular flow voids are preserved.  IMPRESSION: 1. No acute intracranial abnormality. 2. Mild chronic small  vessel ischemic disease and cerebral atrophy.   Electronically Signed   By: Logan Bores   On: 08/18/2013 20:47   Mr Thoracic Spine Wo Contrast  08/18/2013   CLINICAL DATA:  Bilateral lower extremity weakness with bladder impairment.  EXAM: MRI THORACIC SPINE WITHOUT CONTRAST  TECHNIQUE: Multiplanar, multisequence MR imaging of the thoracic spine was performed. No intravenous contrast was administered.  COMPARISON:  Chest radiograph 08/17/2013  FINDINGS: Images are mildly degraded by motion.  There is slight upper thoracic levoscoliosis. There is no listhesis. Vertebral body heights are preserved without compression fracture. Vertebral bone marrow signal is within normal limits. The thoracic spinal cord is normal in caliber and grossly normal in signal allowing for motion. Conus medullaris terminates at L1. There is mild lower thoracic facet arthrosis, most prominently on the right at T9-10. There is mild bilateral neural foraminal narrowing at T9-10 and T10-11. No disc herniation or spinal canal stenosis is seen. There is no spinal cord compression.  There are small bilateral pleural effusions. T2 hyperintense lesions are partially visualized in the upper pole the right kidney, possibly cyst but incompletely evaluated.  IMPRESSION: 1. No thoracic spinal canal stenosis or cord compression. 2. Lower thoracic facet arthrosis with mild neural foraminal narrowing as above. 3. Small bilateral pleural effusions.   Electronically Signed   By: Logan Bores   On: 08/18/2013 20:55    CBC  Recent Labs Lab 08/17/13 1452 08/18/13 0244  WBC 9.7 10.1  HGB 11.4* 10.7*  HCT 33.2* 31.7*  PLT 281 227  MCV 82.4 83.4  MCH 28.3 28.2  MCHC 34.3 33.8  RDW 13.1 13.2  LYMPHSABS  --  1.7  MONOABS  --  0.9  EOSABS  --  0.1  BASOSABS  --  0.0    Chemistries   Recent Labs Lab 08/17/13 1452 08/17/13 2343 08/18/13 0244 08/19/13 0805  NA 133* 138 136* 139  K 4.4 3.7 3.8 3.8  CL 96 102 101 102  CO2 24 25 23 28     GLUCOSE  466* 107* 152* 141*  BUN 23 20 20 11   CREATININE 1.65* 1.47* 1.41* 1.41*  CALCIUM 9.0 8.8 8.8 8.8  AST  --  16 19  --   ALT  --  17 15  --   ALKPHOS  --  89 89  --   BILITOT  --  <0.2* <0.2*  --    ------------------------------------------------------------------------------------------------------------------ estimated creatinine clearance is 43.1 ml/min (by C-G formula based on Cr of 1.41). ------------------------------------------------------------------------------------------------------------------  Recent Labs  08/17/13 2343  HGBA1C 16.8*   ------------------------------------------------------------------------------------------------------------------  Recent Labs  08/18/13 0244  CHOL 287*  HDL 32*  LDLCALC 193*  TRIG 311*  CHOLHDL 9.0   ------------------------------------------------------------------------------------------------------------------  Recent Labs  08/17/13 2353  TSH 3.070   ------------------------------------------------------------------------------------------------------------------  Recent Labs  08/18/13 0409  VITAMINB12 301  FOLATE 11.5  FERRITIN 80  TIBC 182*  IRON 17*  RETICCTPCT 1.5    Coagulation profile No results found for this basename: INR, PROTIME,  in the last 168 hours  No results found for this basename: DDIMER,  in the last 72 hours  Cardiac Enzymes  Recent Labs Lab 08/18/13 0244 08/18/13 0820 08/18/13 1345  TROPONINI <0.30 <0.30 <0.30   ------------------------------------------------------------------------------------------------------------------ No components found with this basename: POCBNP,      Time Spent in minutes   35   SINGH,PRASHANT K M.D on 08/19/2013 at 10:01 AM  Between 7am to 7pm - Pager - [phone]  After 7pm go to [email or web address] - password [email or web address]  And look for the night coverage person covering for me after hours  [facility] Office   [phone]   **Disclaimer: This note may have been dictated with voice recognition software. Similar sounding words can inadvertently be transcribed and this note may contain transcription errors which may not have been corrected upon publication of note.**

## 2013-08-19 NOTE — Evaluation (Signed)
Occupational Therapy Evaluation Patient Details Name: [name] MRN: [id number] DOB: 07-26-1950 Today's Date: 08/19/2013    History of Present Illness [name] is a 63 y.o. female with history of hypertension and diabetes mellitus who has not been very compliant with her medications presents with complaints of weakness of the lower extremity. CT and MRI (-), UTI   Clinical Impression   Patient evaluated by Occupational Therapy with no further acute OT needs identified. All education has been completed and the patient has no further questions. Pt currently requires supervision for safety  for BADLs due to inconsitencies in performance with PT to OT session, but anticipate, she will quickly advance to modified independent. She demonstrated no LOB with activity and was able to simulate stepping over a tub without UE support and no LOB.   All education completed. See below for any follow-up Occupational Therapy or equipment needs. OT is signing off. Thank you for this referral.        Follow Up Recommendations  No OT follow up;Supervision - Intermittent    Equipment Recommendations  None recommended by OT    Recommendations for Other Services       Precautions / Restrictions Precautions Precautions: Fall      Mobility Bed Mobility Overal bed mobility: Needs Assistance Bed Mobility: Supine to Sit     Supine to sit: Supervision     General bed mobility comments: cues for safety with IV  Transfers Overall transfer level: Modified independent Equipment used: Rolling walker (2 wheeled)             General transfer comment: bed, toilet and chair    Balance Overall balance assessment: No apparent balance deficits (not formally assessed) (pt stepped over simulated tub with no UE support and no LOB)   Sitting balance-Leahy Scale: Good       Standing balance-Leahy Scale: Fair                              ADL Overall ADL's : Needs  assistance/impaired Eating/Feeding: Independent   Grooming: Wash/dry hands;Wash/dry face;Oral care;Applying deodorant;Brushing hair;Supervision/safety;Standing   Upper Body Bathing: Set up;Sitting   Lower Body Bathing: Supervison/ safety;Sit to/from stand   Upper Body Dressing : Set up;Sitting   Lower Body Dressing: Supervision/safety;Sit to/from Archivist: Supervision/safety;Regular Museum/gallery exhibitions officer and Hygiene: Supervision/safety;Sit to/from stand   Tub/ Shower Transfer: Tub Research officer, trade union Details (indicate cue type and reason): Pt able to simulate stepping over tub without UE support with no LOB Functional mobility during ADLs: Supervision/safety;Rolling walker General ADL Comments: Pt appears to be quickly approaching her baseline      Vision                 Additional Comments: no obvious deficits   Perception     Praxis Praxis Praxis tested?: Within functional limits    Pertinent Vitals/Pain Denies pain     Hand Dominance Right   Extremity/Trunk Assessment Upper Extremity Assessment Upper Extremity Assessment: Overall WFL for tasks assessed   Lower Extremity Assessment Lower Extremity Assessment: Defer to PT evaluation   Cervical / Trunk Assessment Cervical / Trunk Assessment: Normal   Communication Communication Communication: No difficulties   Cognition Arousal/Alertness: Awake/alert Behavior During Therapy: WFL for tasks assessed/performed Overall Cognitive Status: History of cognitive impairments - at baseline Area of Impairment: Safety/judgement;Problem solving;Following commands       Following Commands: Follows  one step commands inconsistently Safety/Judgement: Decreased awareness of safety;Decreased awareness of deficits   Problem Solving: Requires verbal cues General Comments: Pt with normal behavior during OT session    General Comments       Exercises        Shoulder Instructions      Home Living Family/patient expects to be discharged to:: Private residence Living Arrangements: Spouse/significant other;Other relatives Available Help at Discharge: Family;Available PRN/intermittently Type of Home: House       Home Layout: Two level;Bed/bath upstairs Alternate Level Stairs-Number of Steps: 12- pt reports she crawls up the steps at home   Bathroom Shower/Tub: Tub/shower unit;Curtain Shower/tub characteristics: Architectural technologist: Standard     Home Equipment: Cane - single point;Shower seat          Prior Functioning/Environment Level of Independence: Independent        Comments: pt states she cooks, drives, performs ADLs on her own. Spouse is gone all day til 9pm per sister to get away from pt. Grandchildren are there and leave at 1 and 3 pm    OT Diagnosis:     OT Problem List:     OT Treatment/Interventions:      OT Goals(Current goals can be found in the care plan section) Acute Rehab OT Goals Patient Stated Goal: go home  OT Frequency:     Barriers to D/C:            Co-evaluation              End of Session Equipment Utilized During Treatment: Rolling walker  Activity Tolerance: Patient tolerated treatment well Patient left: in chair;with call bell/phone within reach;with chair alarm set;with family/visitor present   Time: 1251-1318 OT Time Calculation (min): 27 min Charges:  OT General Charges $OT Visit: 1 Procedure OT Evaluation $Initial OT Evaluation Tier I: 1 Procedure OT Treatments $Self Care/Home Management : 8-22 mins G-Codes:    [name] 09/08/2013, 1:27 PM

## 2013-08-19 NOTE — Care Management Note (Addendum)
    Page 1 of 2   08/19/2013     2:40:06 PM CARE MANAGEMENT NOTE 08/19/2013  Patient:  [name], [name]   Account Number:  [id number]  Date Initiated:  08/18/2013  Documentation initiated by:  MAYO,HENRIETTA  Subjective/Objective Assessment:   dx LE weakness/r/o CVA; lives with spouse and 2 adult grandsons     Action/Plan:   Anticipated DC Date:     Anticipated DC Plan:        DC Planning Services  CM consult      [place]   Choice offered to / List presented to:  C-1 Patient        [place] arranged  HH-2 PT      [facility]   Status of service:  Completed, signed off Medicare Important Message given?  NO (If response is "NO", the following Medicare IM given date fields will be blank) Date Medicare IM given:   Medicare IM given by:   Date Additional Medicare IM given:   Additional Medicare IM given by:    Discharge Disposition:  [place]  Per UR Regulation:  Reviewed for med. necessity/level of care/duration of stay  If discussed at [place] of Stay Meetings, dates discussed:    Comments:  Contact:  Levia, [place] Spouse [phone]  08-19-13 1432 Jacqlyn Krauss, RN,BSN [phone] CM did speak to pt in ref to medication needs and home needs. Pt is agreeable to [facility] services with [facility] for HHPT. CM did make referral for services. SOC to begin within 24-48 hours post d/c. MD please write orders for [facility] services listed.  CM did call the [facility] for hospital f/u and PCP needs. CM left vm- if no return call before d/c information on AVS form for pt to call and set up appointment. CM did discuss diabetes meds with pt. The diabetes coordinator did come by and provided pt with a $25.00 card for lantus pens. No further needs from CM at this time.    08/18/13 [place] MSN BSN CCM Met with pt and sister @ bedside.  Pt states she has not been taking her insulin as prescribed due to cost, has not  been consistently getting other scripts filled for same reason.  Dr Arnoldo Morale had been PCP and was furnishing samples on a regular basis but told pt on last visit that he would no longer be seeing her.  She then changed to Dr Silvio Pate @ [facility].  Sister states pt took early retirement and her social security income is $500 monthly.  Advised pt to apply for Medicaid and explained process.  Pt states she had attempted to apply earlier but husband refuses to divulge his income to both her and to DSS.  Since  medicaid and pharmaceutical patient assistance program applications require household income, pt's only option may be to switch from Lantus to 70/30 and as many meds as possible on $4 generic list.

## 2013-08-20 LAB — BASIC METABOLIC PANEL
Anion gap: 9 (ref 5–15)
BUN: 13 mg/dL (ref 6–23)
CHLORIDE: 106 meq/L (ref 96–112)
CO2: 26 meq/L (ref 19–32)
Calcium: 8.8 mg/dL (ref 8.4–10.5)
Creatinine, Ser: 1.29 mg/dL — ABNORMAL HIGH (ref 0.50–1.10)
GFR calc Af Amer: 50 mL/min — ABNORMAL LOW (ref >90)
GFR calc non Af Amer: 43 mL/min — ABNORMAL LOW (ref >90)
GLUCOSE: 99 mg/dL (ref 70–99)
POTASSIUM: 4 meq/L (ref 3.7–5.3)
Sodium: 141 mEq/L (ref 137–147)

## 2013-08-20 LAB — GLUCOSE, CAPILLARY: GLUCOSE-CAPILLARY: 80 mg/dL (ref 70–99)

## 2013-08-20 MED ORDER — ESCITALOPRAM OXALATE 5 MG PO TABS: 5.0000 mg | ORAL_TABLET | Freq: Every day | ORAL | Status: DC

## 2013-08-20 MED ORDER — CLONAZEPAM 0.5 MG PO TABS: 0.5000 mg | ORAL_TABLET | Freq: Every day | ORAL | Status: DC

## 2013-08-20 MED ORDER — GABAPENTIN 300 MG PO CAPS: 300.0000 mg | ORAL_CAPSULE | Freq: Three times a day (TID) | ORAL | Status: DC

## 2013-08-20 MED ORDER — CARVEDILOL 6.25 MG PO TABS: 6.2500 mg | ORAL_TABLET | Freq: Two times a day (BID) | ORAL | Status: AC

## 2013-08-20 MED ORDER — AMLODIPINE-OLMESARTAN 5-40 MG PO TABS: 1.0000 | ORAL_TABLET | Freq: Every day | ORAL | Status: DC

## 2013-08-20 MED ORDER — CEFUROXIME AXETIL 500 MG PO TABS: 500.0000 mg | ORAL_TABLET | Freq: Two times a day (BID) | ORAL | Status: DC

## 2013-08-20 MED ORDER — ATORVASTATIN CALCIUM 10 MG PO TABS: 10.0000 mg | ORAL_TABLET | Freq: Every day | ORAL | Status: AC

## 2013-08-20 MED ORDER — FREESTYLE SYSTEM KIT: 1.0000 | PACK | Freq: Three times a day (TID) | Status: DC

## 2013-08-20 MED ORDER — INSULIN ASPART 100 UNIT/ML ~~LOC~~ SOLN: 0.0000 [IU] | Freq: Three times a day (TID) | SUBCUTANEOUS | Status: AC

## 2013-08-20 MED ORDER — INSULIN GLARGINE 100 UNIT/ML ~~LOC~~ SOLN: 20.0000 [IU] | Freq: Two times a day (BID) | SUBCUTANEOUS | Status: DC

## 2013-08-20 MED ORDER — ASPIRIN EC 81 MG PO TBEC
81.0000 mg | DELAYED_RELEASE_TABLET | Freq: Every day | ORAL | Status: AC
Start: 2013-08-20 — End: ?

## 2013-08-20 NOTE — Progress Notes (Signed)
PT discharged hemodynamically stable.

## 2013-08-20 NOTE — Discharge Summary (Signed)
[name], is a 63 y.o. female  DOB March 16, 1950  MRN [id number].  Admission date:  08/17/2013  Admitting Physician  Rise Patience, MD  Discharge Date:  08/20/2013   Primary MD  Georgetta Haber, MD  Recommendations for primary care physician for things to follow:   Monitor CBC, BMP, A1c and glycemic control closely.   Admission Diagnosis  stroke like sx    Discharge Diagnosis  lower extremity left more than right leg weakness due to severe diabetic peripheral neuropathy   Principal Problem:   Lower extremity weakness Active Problems:   Chest pain   Diabetes mellitus type 2, uncontrolled   Accelerated hypertension   Weakness of both lower extremities      Past Medical History  Diagnosis Date  . Hypertension   . Diabetes mellitus   . Hyperlipidemia   . History of noncompliance with medical treatment   . Shingles 08/18/2013    Past Surgical History  Procedure Laterality Date  . Diabetes    . Fibroids of breast         History of present illness and  Hospital Course:     Kindly see H&P for history of present illness and admission details, please review complete Labs, Consult reports and Test reports for all details in brief  HPI  from the history and physical done on the day of admission  [name] is a 63 y.o. female with history of hypertension and diabetes mellitus who has not been very compliant with her medications presents with complaints of weakness of the lower extremity. Patient in addition has been having right facial numbness and tingling of her upper extremities. Patient states that over the last 2 days patient has been feeling increasingly weak over the lower extremity more on the left lower extremity. With some slurred speech and tingling of the upper extremities. In the ER patient  complained of some chest discomfort which was retrosternal pressure-like nonradiating and was given morphine for which patient's blood pressure dropped and patient became lethargic. Patient was given Narcan following which patient became more alert and awake. CT head was negative for anything acute. Given patient's risk factors patient has been admitted for further management. Patient's blood sugar initially on admission was 400. Patient was given NovoLog 20 units. Patient states that she has been not taking her medications regularly as she has not been following with her primary care physician for last few months. Patient's blood pressure is also found to be elevated. Patient on my exam states her chest pain has resolved. She had nausea and vomiting with some abdominal discomfort. Patient's nausea vomiting happen only after admission. Abdomen appears benign on exam.    Hospital Course    1. Intermittent left more than right lower extremity weakness ongoing for about 2 years. Episodes related to moments of stress per patient. MRI brain and T-spine unremarkable, CT head unremarkable, neurology saw the patient, likely symptoms due to severe peripheral neuropathy due to uncontrolled diabetes mellitus A1c was 16, did by  neurology for home discharge. We'll place on Neurontin, adjusted insulin treatment, will order home PT if she qualifies along with a walker.     2. Extreme anxiety and episodes of lower extremity weakness worse during stress. In life per patient. Psych consulted. On Lexapro and Klonopin per psych. Not suicidal homicidal, outpatient psych followup per PCP if needed.     3. Hypertension, on ARB Norvasc and will add Coreg for better control.      4.DM2 - extremely poor control A1c was 16. Have adjusted Lantus and sliding scale. Question outpatient compliance, provided her with testing supplies and instructions to do Accu-Cheks q. a.c. at bedtime, we'll request PCP to monitor glycemic  control closely.    5. UTI. He did with Rocephin, complete treatment with oral Ceftin for 2 more days.    6. Dyslipidemia. Placed on statin.    7. 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 Completely chest pain-free, no acute chest issues whatsoever, fluid status is Compensated. Placed on Coreg, statin, aspirin ARB. Outpatient cardiology followup. No chest pain or shortness of breath.      Discharge Condition: stable   Follow UP  Follow-up Information   Schedule an appointment as soon as possible for a visit with [facility].   Contact information:   [address] [place] [address] [phone]      Follow up with [facility]. (Physical Therapy)    Contact information:   [address] [place] [address] [phone]       Follow up with Carrie Mew, MD. Schedule an appointment as soon as possible for a visit in 1 week.   Specialty:  Internal Medicine   Contact information:   [address] [place] [address] [phone]       Follow up with [facility]    . Schedule an appointment as soon as possible for a visit in 1 week.   Contact information:   [address] [place] [address] [phone]      Follow up with Corky Crafts., MD. Schedule an appointment as soon as possible for a visit in 1 week.   Specialty:  Interventional Cardiology   Contact information:   [address] [place] [address] [phone]         Discharge Instructions  and  Discharge Medications          Discharge Instructions   Discharge instructions    Complete by:  As directed   Follow with Primary MD Carrie Mew, MD in 7 days   Get CBC, CMP, 2 view Chest X ray checked  by Primary MD next visit.    Activity: As tolerated with Full fall precautions use walker/cane &  assistance as needed   Disposition Home     Diet: Heart Healthy Low Carb  Accuchecks 4 times/day, Once in AM empty stomach and then before each meal. Log in all results and show them to your Prim.MD in 3 days. If any glucose reading is under 80 or above 300 call your Prim MD immidiately. Follow Low glucose instructions for glucose under 80 as instructed.    For Heart failure patients - Check your Weight same time everyday, if you gain over 2 pounds, or you develop in leg swelling, experience more shortness of breath or chest pain, call your Primary MD immediately. Follow Cardiac Low Salt Diet and 1.8  lit/day fluid restriction.   On your next visit with her primary care physician please Get Medicines reviewed and adjusted.  Please request your Prim.MD to go over all Hospital Tests and Procedure/Radiological results at the follow up, please get all Hospital records sent to your Prim MD by signing hospital release before you go home.   If you experience worsening of your admission symptoms, develop shortness of breath, life threatening emergency, suicidal or homicidal thoughts you must seek medical attention immediately by calling 911 or calling your MD immediately  if symptoms less severe.  You Must read complete instructions/literature along with all the possible adverse reactions/side effects for all the Medicines you take and that have been prescribed to you. Take any new Medicines after you have completely understood and accpet all the possible adverse reactions/side effects.   Do not drive, operating heavy machinery, perform activities at heights, swimming or participation in water activities or provide baby sitting services if your were admitted for syncope or siezures until you have seen by Primary MD or a Neurologist and advised to do so again.  Do not drive when taking Pain medications.    Do not take more than prescribed Pain, Sleep and Anxiety Medications  Special  Instructions: If you have smoked or chewed Tobacco  in the last 2 yrs please stop smoking, stop any regular Alcohol  and or any Recreational drug use.  Wear Seat belts while driving.   Please note  You were cared for by a hospitalist during your hospital stay. If you have any questions about your discharge medications or the care you received while you were in the hospital after you are discharged, you can call the unit and asked to speak with the hospitalist on call if the hospitalist that took care of you is not available. Once you are discharged, your primary care physician will handle any further medical issues. Please note that NO REFILLS for any discharge medications will be authorized once you are discharged, as it is imperative that you return to your primary care physician (or establish a relationship with a primary care physician if you do not have one) for your aftercare needs so that they can reassess your need for medications and monitor your lab values.     Increase activity slowly    Complete by:  As directed             Medication List         amLODipine-olmesartan 5-40 MG per tablet  Commonly known as:  AZOR  Take 1 tablet by mouth daily.     aspirin EC 81 MG tablet  Take 1 tablet (81 mg total) by mouth daily.     atorvastatin 10 MG tablet  Commonly known as:  LIPITOR  Take 1 tablet (10 mg total) by mouth daily at 6 PM.     carvedilol 6.25 MG tablet  Commonly known as:  COREG  Take 1 tablet (6.25 mg total) by mouth 2 (two) times daily with a meal.     cefUROXime 500 MG tablet  Commonly known as:  CEFTIN  Take 1 tablet (500 mg total) by mouth 2 (two) times daily with a meal.     clonazePAM 0.5 MG tablet  Commonly known as:  KLONOPIN  Take 1 tablet (0.5 mg total) by mouth at bedtime.     escitalopram 5 MG tablet  Commonly known as:  LEXAPRO  Take 1 tablet (5 mg total) by mouth daily.  gabapentin 300 MG capsule  Commonly known as:  NEURONTIN  Take 1  capsule (300 mg total) by mouth 3 (three) times daily.     glucose monitoring kit monitoring kit  1 each by Does not apply route 4 (four) times daily - after meals and at bedtime. 1 month Diabetic Testing Supplies for QAC-QHS accuchecks.Any brand OK     insulin aspart 100 UNIT/ML injection  Commonly known as:  novoLOG  - Inject 0-10 Units into the skin 3 (three) times daily with meals. Before each meal 3 times a day, 140-199 - 2 units, 200-250 - 4 units, 251-299 - 6 units,  300-349 - 8 units,  350 or above 10 units.  - Insulin PEN if approved, provide syringes and needles if needed.     insulin glargine 100 UNIT/ML injection  Commonly known as:  LANTUS  Inject 0.2 mLs (20 Units total) into the skin 2 (two) times daily. Take 20 units in the morning and 20 units in the evening     metFORMIN 500 MG tablet  Commonly known as:  GLUCOPHAGE  Take 500 mg by mouth 2 (two) times daily with a meal.          Diet and Activity recommendation: See Discharge Instructions above   Consults obtained - Neuro    Major procedures and Radiology Reports - PLEASE review detailed and final reports for all details, in brief -   TTE  - Left ventricle: The cavity size was normal. Wall thickness was increased in a pattern of mild LVH. Systolic function was mildly to moderately reduced. The estimated ejection fraction was in the range of 40% to 45%. Hypokinesis of the inferolateral myocardium. Doppler parameters are consistent with abnormal left ventricular relaxation (grade 1 diastolic dysfunction). - Mitral valve: There was moderate regurgitation. - Left atrium: The atrium was mildly dilated.    Dg Chest 2 View  08/17/2013   CLINICAL DATA:  Hypertension.  EXAM: CHEST  2 VIEW  COMPARISON:  None.  FINDINGS: Mediastinum and hilar structures normal. Tiny nodular density is noted projected over the left upper lobe at the level of the anterior left second rib. To exclude a true pulmonary nodule nonenhanced  chest CT is suggested. Lungs are clear of acute infiltrates. No pleural effusion or pneumothorax. Heart size and pulmonary vascularity normal. Diffuse degenerative changes thoracic spine.  IMPRESSION: Tiny nodular density projected over the left upper lobe, this could represent a true pulmonary nodule and nonemergent nonenhanced chest CT is suggested for further evaluation   Electronically Signed   By: Marcello Moores  Register   On: 08/17/2013 16:36   Ct Head Wo Contrast  08/17/2013   CLINICAL DATA:  Facial tingling and slurred speech  EXAM: CT HEAD WITHOUT CONTRAST  TECHNIQUE: Contiguous axial images were obtained from the base of the skull through the vertex without intravenous contrast.  COMPARISON:  None.  FINDINGS: The bony calvarium is intact. The ventricles are of normal size and configuration. No findings to suggest acute hemorrhage, acute infarction or space-occupying mass lesion are noted.  IMPRESSION: No acute abnormality is noted.   Electronically Signed   By: Inez Catalina M.D.   On: 08/17/2013 20:35   Mr Brain Wo Contrast  08/18/2013   CLINICAL DATA:  Right facial numbness, upper extremity tingling, and lower extremity weakness. Stroke.  EXAM: MRI HEAD WITHOUT CONTRAST  TECHNIQUE: Multiplanar, multiecho pulse sequences of the brain and surrounding structures were obtained without intravenous contrast.  COMPARISON:  Head CT 08/17/2013  FINDINGS: There is no evidence of acute infarct, intracranial hemorrhage, mass, midline shift, or extra-axial fluid collection. Small foci of T2 hyperintensity in the subcortical and periventricular white matter are nonspecific but compatible with mild chronic small vessel ischemic disease. There is mild cerebral atrophy.  Orbits are unremarkable. Left maxillary sinus mucous retention cyst is noted. Mastoid air cells are clear. Major intracranial vascular flow voids are preserved.  IMPRESSION: 1. No acute intracranial abnormality. 2. Mild chronic small vessel ischemic  disease and cerebral atrophy.   Electronically Signed   By: Sebastian Ache   On: 08/18/2013 20:47   Mr Thoracic Spine Wo Contrast  08/18/2013   CLINICAL DATA:  Bilateral lower extremity weakness with bladder impairment.  EXAM: MRI THORACIC SPINE WITHOUT CONTRAST  TECHNIQUE: Multiplanar, multisequence MR imaging of the thoracic spine was performed. No intravenous contrast was administered.  COMPARISON:  Chest radiograph 08/17/2013  FINDINGS: Images are mildly degraded by motion.  There is slight upper thoracic levoscoliosis. There is no listhesis. Vertebral body heights are preserved without compression fracture. Vertebral bone marrow signal is within normal limits. The thoracic spinal cord is normal in caliber and grossly normal in signal allowing for motion. Conus medullaris terminates at L1. There is mild lower thoracic facet arthrosis, most prominently on the right at T9-10. There is mild bilateral neural foraminal narrowing at T9-10 and T10-11. No disc herniation or spinal canal stenosis is seen. There is no spinal cord compression.  There are small bilateral pleural effusions. T2 hyperintense lesions are partially visualized in the upper pole the right kidney, possibly cyst but incompletely evaluated.  IMPRESSION: 1. No thoracic spinal canal stenosis or cord compression. 2. Lower thoracic facet arthrosis with mild neural foraminal narrowing as above. 3. Small bilateral pleural effusions.   Electronically Signed   By: Sebastian Ache   On: 08/18/2013 20:55    Micro Results     Recent Results (from the past 240 hour(s))  MRSA PCR SCREENING     Status: None   Collection Time    08/17/13  7:30 PM      Result Value Ref Range Status   MRSA by PCR NEGATIVE  NEGATIVE Final   Comment:            The GeneXpert MRSA Assay (FDA     approved for NASAL specimens     only), is one component of a     comprehensive MRSA colonization     surveillance program. It is not     intended to diagnose MRSA      infection nor to guide or     monitor treatment for     MRSA infections.       Today   Subjective:   [name] today has no headache,no chest abdominal pain,no new weakness tingling or numbness, feels much better wants to go home today.    Objective:   Blood pressure 140/70, pulse 78, temperature 98.4 F (36.9 C), temperature source Oral, resp. rate 18, height 5\' 6"  (1.676 m), weight 78.7 kg (173 lb 8 oz), SpO2 99.00%.   Intake/Output Summary (Last 24 hours) at 08/20/13 1041 Last data filed at 08/20/13 0815  Gross per 24 hour  Intake    760 ml  Output      0 ml  Net    760 ml    Exam Awake Alert, Oriented x 3, No new F.N deficits, Normal affect [place].AT,PERRAL Supple Neck,No JVD, No cervical lymphadenopathy appriciated.  Symmetrical Chest wall movement, Good  air movement bilaterally, CTAB RRR,No Gallops,Rubs or new Murmurs, No Parasternal Heave +ve B.Sounds, Abd Soft, Non tender, No organomegaly appriciated, No rebound -guarding or rigidity. No Cyanosis, Clubbing or edema, No new Rash or bruise  Data Review   CBC w Diff: Lab Results  Component Value Date   WBC 10.1 08/18/2013   HGB 10.7* 08/18/2013   HCT 31.7* 08/18/2013   PLT 227 08/18/2013   LYMPHOPCT 17 08/18/2013   MONOPCT 9 08/18/2013   EOSPCT 1 08/18/2013   BASOPCT 0 08/18/2013    CMP: Lab Results  Component Value Date   NA 141 08/20/2013   K 4.0 08/20/2013   CL 106 08/20/2013   CO2 26 08/20/2013   BUN 13 08/20/2013   CREATININE 1.29* 08/20/2013   PROT 5.8* 08/18/2013   ALBUMIN 2.1* 08/18/2013   BILITOT <0.2* 08/18/2013   ALKPHOS 89 08/18/2013   AST 19 08/18/2013   ALT 15 08/18/2013  .   Total Time in preparing paper work, data evaluation and todays exam - 35 minutes  Thurnell Lose M.D on 08/20/2013 at 10:41 AM  [facility] Office  [phone]   **Disclaimer: This note may have been dictated with voice recognition software. Similar sounding words can inadvertently be transcribed and this note  may contain transcription errors which may not have been corrected upon publication of note.**

## 2013-08-20 NOTE — Discharge Instructions (Addendum)
Follow with Primary MD Georgetta Haber, MD in 7 days   Get CBC, CMP, 2 view Chest X ray checked  by Primary MD next visit.    Activity: As tolerated with Full fall precautions use walker/cane & assistance as needed   Disposition Home     Diet: Heart Healthy Low Carb  Accuchecks 4 times/day, Once in AM empty stomach and then before each meal. Log in all results and show them to your Prim.MD in 3 days. If any glucose reading is under 80 or above 300 call your Prim MD immidiately. Follow Low glucose instructions for glucose under 80 as instructed.    For Heart failure patients - Check your Weight same time everyday, if you gain over 2 pounds, or you develop in leg swelling, experience more shortness of breath or chest pain, call your Primary MD immediately. Follow Cardiac Low Salt Diet and 1.8 lit/day fluid restriction.   On your next visit with her primary care physician please Get Medicines reviewed and adjusted.  Please request your Prim.MD to go over all Hospital Tests and Procedure/Radiological results at the follow up, please get all Hospital records sent to your Prim MD by signing hospital release before you go home.   If you experience worsening of your admission symptoms, develop shortness of breath, life threatening emergency, suicidal or homicidal thoughts you must seek medical attention immediately by calling 911 or calling your MD immediately  if symptoms less severe.  You Must read complete instructions/literature along with all the possible adverse reactions/side effects for all the Medicines you take and that have been prescribed to you. Take any new Medicines after you have completely understood and accpet all the possible adverse reactions/side effects.   Do not drive, operating heavy machinery, perform activities at heights, swimming or participation in water activities or provide baby sitting services if your were admitted for syncope or siezures until you have  seen by Primary MD or a Neurologist and advised to do so again.  Do not drive when taking Pain medications.    Do not take more than prescribed Pain, Sleep and Anxiety Medications  Special Instructions: If you have smoked or chewed Tobacco  in the last 2 yrs please stop smoking, stop any regular Alcohol  and or any Recreational drug use.  Wear Seat belts while driving.   Please note  You were cared for by a hospitalist during your hospital stay. If you have any questions about your discharge medications or the care you received while you were in the hospital after you are discharged, you can call the unit and asked to speak with the hospitalist on call if the hospitalist that took care of you is not available. Once you are discharged, your primary care physician will handle any further medical issues. Please note that NO REFILLS for any discharge medications will be authorized once you are discharged, as it is imperative that you return to your primary care physician (or establish a relationship with a primary care physician if you do not have one) for your aftercare needs so that they can reassess your need for medications and monitor your lab values. STROKE/TIA DISCHARGE INSTRUCTIONS SMOKING Cigarette smoking nearly doubles your risk of having a stroke & is the single most alterable risk factor  If you smoke or have smoked in the last 12 months, you are advised to quit smoking for your health.  Most of the excess cardiovascular risk related to smoking disappears within a year of stopping.  Ask you doctor about anti-smoking medications  [place] Quit Line: 1-800-QUIT NOW  Free Smoking Cessation Classes [phone]  CHOLESTEROL Know your levels; limit fat & cholesterol in your diet  Lipid Panel     Component Value Date/Time   CHOL 287* 08/18/2013 0244   TRIG 311* 08/18/2013 0244   HDL 32* 08/18/2013 0244   CHOLHDL 9.0 08/18/2013 0244   VLDL 62* 08/18/2013 0244   LDLCALC 193* 08/18/2013 0244        Many patients benefit from treatment even if their cholesterol is at goal.  Goal: Total Cholesterol (CHOL) less than 160  Goal:  Triglycerides (TRIG) less than 150  Goal:  HDL greater than 40  Goal:  LDL (LDLCALC) less than 100   BLOOD PRESSURE American Stroke Association blood pressure target is less that 120/80 mm/Hg  Your discharge blood pressure is:  BP: 155/87 mmHg   Monitor your blood pressure  Limit your salt and alcohol intake  Many individuals will require more than one medication for high blood pressure  DIABETES (A1c is a blood sugar average for last 3 months) HGBA1C = 16.8 Goal HGBA1c is under 7% (HBGA1c is blood sugar average for last 3 months)  Diabetes: Diagnosis of diabetes:  Your A1c:16.8 %    Lab Results  Component Value Date   HGBA1C 16.8* 08/17/2013     Your HGBA1c can be lowered with medications, healthy diet, and exercise.  Check your blood sugar as directed by your physician  Call your physician if you experience unexplained or low blood sugars.  PHYSICAL ACTIVITY/REHABILITATION Goal is 30 minutes at least 4 days per week  Activity: Increase activity slowly, Therapies: Physical Therapy: Home Health and Occupational Therapy: Home Health Return to work:     Activity decreases your risk of heart attack and stroke and makes your heart stronger.  It helps control your weight and blood pressure; helps you relax and can improve your mood.  Participate in a regular exercise program.  Talk with your doctor about the best form of exercise for you (dancing, walking, swimming, cycling).  DIET/WEIGHT Goal is to maintain a healthy weight  Your discharge diet is:   thin  liquids Your height is:  Height: 5\' 6"  (167.6 cm) Your current weight is: Weight: 78.7 kg (173 lb 8 oz) Your Body Mass Index (BMI) is:  BMI (Calculated): 30.7  Following the type of diet specifically designed for you will help prevent another stroke.  Your goal weight range is: 118 -  149  Your goal Body Mass Index (BMI) is 19-24.  Healthy food habits can help reduce 3 risk factors for stroke:  High cholesterol, hypertension, and excess weight.  RESOURCES Stroke/Support Group:  Call [phone]   STROKE EDUCATION PROVIDED/REVIEWED AND GIVEN TO PATIENT Stroke warning signs and symptoms How to activate emergency medical system (call 911). Medications prescribed at discharge. Need for follow-up after discharge. Personal risk factors for stroke. Pneumonia vaccine given: No Flu vaccine given: No My questions have been answered, the writing is legible, and I understand these instructions.  I will adhere to these goals & educational materials that have been provided to me after my discharge from the hospital.

## 2013-08-29 ENCOUNTER — Ambulatory Visit: Payer: BC Managed Care – PPO | Attending: Internal Medicine | Admitting: Internal Medicine

## 2013-08-29 ENCOUNTER — Encounter: Payer: Self-pay | Admitting: Internal Medicine

## 2013-08-29 VITALS — BP 167/90 | HR 83 | Temp 98.0°F | Resp 14 | Ht 66.0 in | Wt 182.0 lb

## 2013-08-29 DIAGNOSIS — Z794 Long term (current) use of insulin: Secondary | ICD-10-CM | POA: Insufficient documentation

## 2013-08-29 DIAGNOSIS — I10 Essential (primary) hypertension: Secondary | ICD-10-CM | POA: Insufficient documentation

## 2013-08-29 DIAGNOSIS — E119 Type 2 diabetes mellitus without complications: Secondary | ICD-10-CM | POA: Insufficient documentation

## 2013-08-29 DIAGNOSIS — E785 Hyperlipidemia, unspecified: Secondary | ICD-10-CM | POA: Diagnosis not present

## 2013-08-29 DIAGNOSIS — Z79899 Other long term (current) drug therapy: Secondary | ICD-10-CM | POA: Diagnosis not present

## 2013-08-29 DIAGNOSIS — Z91010 Allergy to peanuts: Secondary | ICD-10-CM | POA: Insufficient documentation

## 2013-08-29 DIAGNOSIS — Z7982 Long term (current) use of aspirin: Secondary | ICD-10-CM | POA: Insufficient documentation

## 2013-08-29 DIAGNOSIS — Z885 Allergy status to narcotic agent status: Secondary | ICD-10-CM | POA: Insufficient documentation

## 2013-08-29 DIAGNOSIS — Z006 Encounter for examination for normal comparison and control in clinical research program: Secondary | ICD-10-CM | POA: Insufficient documentation

## 2013-08-29 LAB — GLUCOSE, POCT (MANUAL RESULT ENTRY): POC GLUCOSE: 127 mg/dL — AB (ref 70–99)

## 2013-08-29 MED ORDER — NAPHAZOLINE HCL 0.1 % OP SOLN: 1.0000 [drp] | Freq: Four times a day (QID) | OPHTHALMIC | Status: DC | PRN

## 2013-08-29 MED ORDER — AMLODIPINE-OLMESARTAN 10-40 MG PO TABS: 1.0000 | ORAL_TABLET | Freq: Every day | ORAL | Status: AC

## 2013-08-29 MED ORDER — METFORMIN HCL 500 MG PO TABS: 500.0000 mg | ORAL_TABLET | Freq: Two times a day (BID) | ORAL | Status: DC

## 2013-08-29 NOTE — Patient Instructions (Signed)
DASH Eating Plan DASH stands for "Dietary Approaches to Stop Hypertension." The DASH eating plan is a healthy eating plan that has been shown to reduce high blood pressure (hypertension). Additional health benefits may include reducing the risk of type 2 diabetes mellitus, heart disease, and stroke. The DASH eating plan may also help with weight loss. WHAT DO I NEED TO KNOW ABOUT THE DASH EATING PLAN? For the DASH eating plan, you will follow these general guidelines:  Choose foods with a percent daily value for sodium of less than 5% (as listed on the food label).  Use salt-free seasonings or herbs instead of table salt or sea salt.  Check with your health care provider or pharmacist before using salt substitutes.  Eat lower-sodium products, often labeled as "lower sodium" or "no salt added."  Eat fresh foods.  Eat more vegetables, fruits, and low-fat dairy products.  Choose whole grains. Look for the word "whole" as the first word in the ingredient list.  Choose fish and skinless chicken or turkey more often than red meat. Limit fish, poultry, and meat to 6 oz (170 g) each day.  Limit sweets, desserts, sugars, and sugary drinks.  Choose heart-healthy fats.  Limit cheese to 1 oz (28 g) per day.  Eat more home-cooked food and less restaurant, buffet, and fast food.  Limit fried foods.  Cook foods using methods other than frying.  Limit canned vegetables. If you do use them, rinse them well to decrease the sodium.  When eating at a restaurant, ask that your food be prepared with less salt, or no salt if possible. WHAT FOODS CAN I EAT? Seek help from a dietitian for individual calorie needs. Grains Whole grain or whole wheat bread. Brown rice. Whole grain or whole wheat pasta. Quinoa, bulgur, and whole grain cereals. Low-sodium cereals. Corn or whole wheat flour tortillas. Whole grain cornbread. Whole grain crackers. Low-sodium crackers. Vegetables Fresh or frozen vegetables  (raw, steamed, roasted, or grilled). Low-sodium or reduced-sodium tomato and vegetable juices. Low-sodium or reduced-sodium tomato sauce and paste. Low-sodium or reduced-sodium canned vegetables.  Fruits All fresh, canned (in natural juice), or frozen fruits. Meat and Other Protein Products Ground beef (85% or leaner), grass-fed beef, or beef trimmed of fat. Skinless chicken or turkey. Ground chicken or turkey. Pork trimmed of fat. All fish and seafood. Eggs. Dried beans, peas, or lentils. Unsalted nuts and seeds. Unsalted canned beans. Dairy Low-fat dairy products, such as skim or 1% milk, 2% or reduced-fat cheeses, low-fat ricotta or cottage cheese, or plain low-fat yogurt. Low-sodium or reduced-sodium cheeses. Fats and Oils Tub margarines without trans fats. Light or reduced-fat mayonnaise and salad dressings (reduced sodium). Avocado. Safflower, olive, or canola oils. Natural peanut or almond butter. Other Unsalted popcorn and pretzels. The items listed above may not be a complete list of recommended foods or beverages. Contact your dietitian for more options. WHAT FOODS ARE NOT RECOMMENDED? Grains White bread. White pasta. White rice. Refined cornbread. Bagels and croissants. Crackers that contain trans fat. Vegetables Creamed or fried vegetables. Vegetables in a cheese sauce. Regular canned vegetables. Regular canned tomato sauce and paste. Regular tomato and vegetable juices. Fruits Dried fruits. Canned fruit in light or heavy syrup. Fruit juice. Meat and Other Protein Products Fatty cuts of meat. Ribs, chicken wings, bacon, sausage, bologna, salami, chitterlings, fatback, hot dogs, bratwurst, and packaged luncheon meats. Salted nuts and seeds. Canned beans with salt. Dairy Whole or 2% milk, cream, half-and-half, and cream cheese. Whole-fat or sweetened yogurt. Full-fat   cheeses or blue cheese. Nondairy creamers and whipped toppings. Processed cheese, cheese spreads, or cheese  curds. Condiments Onion and garlic salt, seasoned salt, table salt, and sea salt. Canned and packaged gravies. Worcestershire sauce. Tartar sauce. Barbecue sauce. Teriyaki sauce. Soy sauce, including reduced sodium. Steak sauce. Fish sauce. Oyster sauce. Cocktail sauce. Horseradish. Ketchup and mustard. Meat flavorings and tenderizers. Bouillon cubes. Hot sauce. Tabasco sauce. Marinades. Taco seasonings. Relishes. Fats and Oils Butter, stick margarine, lard, shortening, ghee, and bacon fat. Coconut, palm kernel, or palm oils. Regular salad dressings. Other Pickles and olives. Salted popcorn and pretzels. The items listed above may not be a complete list of foods and beverages to avoid. Contact your dietitian for more information. WHERE CAN I FIND MORE INFORMATION? National Heart, Lung, and [employer] Institute: www.nhlbi.nih.gov/health/health-topics/topics/dash/ Document Released: 12/26/2010 Document Revised: 05/23/2013 Document Reviewed: 11/10/2012 ExitCare Patient Information 2015 [employer]. This information is not intended to replace advice given to you by your health care provider. Make sure you discuss any questions you have with your health care provider. Hypertension Hypertension, commonly called high blood pressure, is when the force of blood pumping through your arteries is too strong. Your arteries are the blood vessels that carry blood from your heart throughout your body. A blood pressure reading consists of a higher number over a lower number, such as 110/72. The higher number (systolic) is the pressure inside your arteries when your heart pumps. The lower number (diastolic) is the pressure inside your arteries when your heart relaxes. Ideally you want your blood pressure below 120/80. Hypertension forces your heart to work harder to pump blood. Your arteries may become narrow or stiff. Having hypertension puts you at risk for heart disease, stroke, and other problems.  RISK  FACTORS Some risk factors for high blood pressure are controllable. Others are not.  Risk factors you cannot control include:   Race. You may be at higher risk if you are African American.  Age. Risk increases with age.  Gender. Men are at higher risk than women before age 45 years. After age 65, women are at higher risk than men. Risk factors you can control include:  Not getting enough exercise or physical activity.  Being overweight.  Getting too much fat, sugar, calories, or salt in your diet.  Drinking too much alcohol. SIGNS AND SYMPTOMS Hypertension does not usually cause signs or symptoms. Extremely high blood pressure (hypertensive crisis) may cause headache, anxiety, shortness of breath, and nosebleed. DIAGNOSIS  To check if you have hypertension, your health care provider will measure your blood pressure while you are seated, with your arm held at the level of your heart. It should be measured at least twice using the same arm. Certain conditions can cause a difference in blood pressure between your right and left arms. A blood pressure reading that is higher than normal on one occasion does not mean that you need treatment. If one blood pressure reading is high, ask your health care provider about having it checked again. TREATMENT  Treating high blood pressure includes making lifestyle changes and possibly taking medicine. Living a healthy lifestyle can help lower high blood pressure. You may need to change some of your habits. Lifestyle changes may include:  Following the DASH diet. This diet is high in fruits, vegetables, and whole grains. It is low in salt, red meat, and added sugars.  Getting at least 2 hours of brisk physical activity every week.  Losing weight if necessary.  Not smoking.  Limiting   alcoholic beverages.  Learning ways to reduce stress. If lifestyle changes are not enough to get your blood pressure under control, your health care provider may  prescribe medicine. You may need to take more than one. Work closely with your health care provider to understand the risks and benefits. HOME CARE INSTRUCTIONS  Have your blood pressure rechecked as directed by your health care provider.   Take medicines only as directed by your health care provider. Follow the directions carefully. Blood pressure medicines must be taken as prescribed. The medicine does not work as well when you skip doses. Skipping doses also puts you at risk for problems.   Do not smoke.   Monitor your blood pressure at home as directed by your health care provider. SEEK MEDICAL CARE IF:   You think you are having a reaction to medicines taken.  You have recurrent headaches or feel dizzy.  You have swelling in your ankles.  You have trouble with your vision. SEEK IMMEDIATE MEDICAL CARE IF:  You develop a severe headache or confusion.  You have unusual weakness, numbness, or feel faint.  You have severe chest or abdominal pain.  You vomit repeatedly.  You have trouble breathing. MAKE SURE YOU:   Understand these instructions.  Will watch your condition.  Will get help right away if you are not doing well or get worse. Document Released: 01/06/2005 Document Revised: 05/23/2013 Document Reviewed: 10/29/2012 ExitCare Patient Information 2015 [employer]. This information is not intended to replace advice given to you by your health care provider. Make sure you discuss any questions you have with your health care provider. Diabetes Mellitus and Food It is important for you to manage your blood sugar (glucose) level. Your blood glucose level can be greatly affected by what you eat. Eating healthier foods in the appropriate amounts throughout the day at about the same time each day will help you control your blood glucose level. It can also help slow or prevent worsening of your diabetes mellitus. Healthy eating may even help you improve the level of your  blood pressure and reach or maintain a healthy weight.  HOW CAN FOOD AFFECT ME? Carbohydrates Carbohydrates affect your blood glucose level more than any other type of food. Your dietitian will help you determine how many carbohydrates to eat at each meal and teach you how to count carbohydrates. Counting carbohydrates is important to keep your blood glucose at a healthy level, especially if you are using insulin or taking certain medicines for diabetes mellitus. Alcohol Alcohol can cause sudden decreases in blood glucose (hypoglycemia), especially if you use insulin or take certain medicines for diabetes mellitus. Hypoglycemia can be a life-threatening condition. Symptoms of hypoglycemia (sleepiness, dizziness, and disorientation) are similar to symptoms of having too much alcohol.  If your health care provider has given you approval to drink alcohol, do so in moderation and use the following guidelines:  Women should not have more than one drink per day, and men should not have more than two drinks per day. One drink is equal to:  12 oz of beer.  5 oz of wine.  1 oz of hard liquor.  Do not drink on an empty stomach.  Keep yourself hydrated. Have water, diet soda, or unsweetened iced tea.  Regular soda, juice, and other mixers might contain a lot of carbohydrates and should be counted. WHAT FOODS ARE NOT RECOMMENDED? As you make food choices, it is important to remember that all foods are not the same.   Some foods have fewer nutrients per serving than other foods, even though they might have the same number of calories or carbohydrates. It is difficult to get your body what it needs when you eat foods with fewer nutrients. Examples of foods that you should avoid that are high in calories and carbohydrates but low in nutrients include:  Trans fats (most processed foods list trans fats on the Nutrition Facts label).  Regular soda.  Juice.  Candy.  Sweets, such as cake, pie, doughnuts,  and cookies.  Fried foods. WHAT FOODS CAN I EAT? Have nutrient-rich foods, which will nourish your body and keep you healthy. The food you should eat also will depend on several factors, including:  The calories you need.  The medicines you take.  Your weight.  Your blood glucose level.  Your blood pressure level.  Your cholesterol level. You also should eat a variety of foods, including:  Protein, such as meat, poultry, fish, tofu, nuts, and seeds (lean animal proteins are best).  Fruits.  Vegetables.  Dairy products, such as milk, cheese, and yogurt (low fat is best).  Breads, grains, pasta, cereal, rice, and beans.  Fats such as olive oil, trans fat-free margarine, canola oil, avocado, and olives. DOES EVERYONE WITH DIABETES MELLITUS HAVE THE SAME MEAL PLAN? Because every person with diabetes mellitus is different, there is not one meal plan that works for everyone. It is very important that you meet with a dietitian who will help you create a meal plan that is just right for you. Document Released: 10/03/2004 Document Revised: 01/11/2013 Document Reviewed: 12/03/2012 ExitCare Patient Information 2015 [employer]. This information is not intended to replace advice given to you by your health care provider. Make sure you discuss any questions you have with your health care provider.  

## 2013-08-29 NOTE — Progress Notes (Signed)
Patient ID: [name], female   DOB: 06-Feb-1950, 63 y.o.   MRN: [id number]   [name], is a 63 y.o. female  [id number]:[id number]  [id number]  DOB - [birth year]  CC:  Chief Complaint  Patient presents with  . Hospitalization Follow-up  . Establish Care       HPI: [name] is a 63 y.o. female here today to establish medical care and followup hospital admission. She has history of hypertension, uncontrolled diabetes mellitus due to noncompliance, hyperlipidemia, diabetic nephropathy and diabetic neuropathy recently admitted from numbness and weakness initially thought to be CVA but workup was negative for acute cerebrovascular disease. She was admitted however due to her numerous risk factors. MRI there is negative. Neurology consult diagnosed neuropathy, the patient was started on gabapentin. She is now on insulin Lantus 20 units every 12 hourly, NovoLog insulin to scale and metformin 500 mg tablet by mouth twice a day. She was Dr. Adline Mango patient for a long time at [facility] primary care, PCP was recently no more available at the practice according to patient, she was scheduled to see another doctor at the same practice but the appointment is far out and she ran out of her medications so she has not been getting her medications for over a long period of time. Her most recent hemoglobin A1c has gone up to 16.8% from 10.0% last year. Today patient has no significant complaint except that she needs all her medications refilled. She promised to be compliant with medications henceforth. She has some blurry vision but no photophobia, no photophobia. Her kidney function has been affected but she is yet to see a nephrologist. Her blood sugar today is 127 patient claims that her maximum blood sugar at home has been 145 on the current regimen and lowest was 75. She has no dizziness, no symptom suggestive of hypoglycemia. She does not smoke cigarette. Last echocardiogram on 08/18/2013 shows left  ventricular ejection fraction of 40-45%, mild LVH and inferolateral hypokinesis, grade 1 diastolic dysfunction and moderate mitral valve regurgitation. Patient has No headache, No chest pain, No abdominal pain - No Nausea, No Cough - SOB.  Allergies  Allergen Reactions  . Codeine     REACTION: Nausea Vomiting  . Grapefruit Concentrate     itching  . Morphine And Related   . Peanut-Containing Drug Products     itching   Past Medical History  Diagnosis Date  . Hypertension   . Diabetes mellitus   . Hyperlipidemia   . History of noncompliance with medical treatment   . Shingles 08/18/2013   Current Outpatient Prescriptions on File Prior to Visit  Medication Sig Dispense Refill  . aspirin EC 81 MG tablet Take 1 tablet (81 mg total) by mouth daily.  30 tablet  0  . atorvastatin (LIPITOR) 10 MG tablet Take 1 tablet (10 mg total) by mouth daily at 6 PM.  30 tablet  0  . carvedilol (COREG) 6.25 MG tablet Take 1 tablet (6.25 mg total) by mouth 2 (two) times daily with a meal.  60 tablet  0  . cefUROXime (CEFTIN) 500 MG tablet Take 1 tablet (500 mg total) by mouth 2 (two) times daily with a meal.  4 tablet  0  . clonazePAM (KLONOPIN) 0.5 MG tablet Take 1 tablet (0.5 mg total) by mouth at bedtime.  30 tablet  0  . escitalopram (LEXAPRO) 5 MG tablet Take 1 tablet (5 mg total) by mouth daily.  30 tablet  0  . gabapentin (  NEURONTIN) 300 MG capsule Take 1 capsule (300 mg total) by mouth 3 (three) times daily.  90 capsule  0  . glucose monitoring kit (FREESTYLE) monitoring kit 1 each by Does not apply route 4 (four) times daily - after meals and at bedtime. 1 month Diabetic Testing Supplies for QAC-QHS accuchecks.Any brand OK  1 each  1  . insulin aspart (NOVOLOG) 100 UNIT/ML injection Inject 0-10 Units into the skin 3 (three) times daily with meals. Before each meal 3 times a day, 140-199 - 2 units, 200-250 - 4 units, 251-299 - 6 units,  300-349 - 8 units,  350 or above 10 units. Insulin PEN if  approved, provide syringes and needles if needed.  10 mL  11  . insulin glargine (LANTUS) 100 UNIT/ML injection Inject 0.2 mLs (20 Units total) into the skin 2 (two) times daily. Take 20 units in the morning and 20 units in the evening  10 mL  11   No current facility-administered medications on file prior to visit.   Family History  Problem Relation Age of Onset  . Diabetes    . Hypertension    . Hypertension Father   . Diabetes Father   . Hypertension Mother   . Diabetes Mother   . Diabetes Sister   . Hypertension Sister   . Diabetes Sister   . Hypertension Sister    History   Social History  . Marital Status: Married    Spouse Name: N/A    Number of Children: N/A  . Years of Education: N/A   Occupational History  . Not on file.   Social History Main Topics  . Smoking status: Never Smoker   . Smokeless tobacco: Never Used  . Alcohol Use: No  . Drug Use: No  . Sexual Activity: Not Currently   Other Topics Concern  . Not on file   Social History Narrative  . No narrative on file    Review of Systems: Constitutional: Negative for fever, chills, diaphoresis, activity change, appetite change and fatigue. HENT: Negative for ear pain, nosebleeds, congestion, facial swelling, rhinorrhea, neck pain, neck stiffness and ear discharge.  Eyes: ++Blurry vision, Negative for pain, discharge, redness, itching  Respiratory: Negative for cough, choking, chest tightness, shortness of breath, wheezing and stridor.  Cardiovascular: Negative for chest pain, palpitations and leg swelling. Gastrointestinal: Negative for abdominal distention. Genitourinary: Negative for dysuria, urgency, frequency, hematuria, flank pain, decreased urine volume, difficulty urinating and dyspareunia.  Musculoskeletal: Negative for back pain, joint swelling, arthralgia and gait problem. Neurological: Negative for dizziness, tremors, seizures, syncope, facial asymmetry, speech difficult Hematological:  Negative for adenopathy. Does not bruise/bleed easily. Psychiatric/Behavioral: Negative for hallucinations, behavioral problems, confusion, dysphoric mood, decreased concentration and agitation.    Objective:   Filed Vitals:   08/29/13 1434  BP: 167/90  Pulse: 83  Temp: 98 F (36.7 C)  Resp: 14    Physical Exam: Constitutional: Patient appears well-developed and well-nourished. No distress. HENT: Normocephalic, atraumatic, External right and left ear normal. Oropharynx is clear and moist.  Eyes: Conjunctivae and EOM are normal. PERRLA, no scleral icterus. Neck: Normal ROM. Neck supple. No JVD. No tracheal deviation. No thyromegaly. CVS: RRR, S1/S2 +, + murmurs, no gallops, no carotid bruit.  Pulmonary: Effort and breath sounds normal, no stridor, rhonchi, wheezes, rales.  Abdominal: Soft. BS +, no distension, tenderness, rebound or guarding. Bruise anterior abdominal wall from previous subcutaneous heparin injection during her last admission  Musculoskeletal: Normal range of motion. No edema and  no tenderness.  Lymphadenopathy: No lymphadenopathy noted, cervical, inguinal or axillary Neuro: Alert. Normal reflexes, muscle tone coordination. No cranial nerve deficit. Skin: Skin is warm and dry. No rash noted. Not diaphoretic. No erythema. No pallor. Psychiatric: Normal mood and affect. Behavior, judgment, thought content normal.  Lab Results  Component Value Date   WBC 10.1 08/18/2013   HGB 10.7* 08/18/2013   HCT 31.7* 08/18/2013   MCV 83.4 08/18/2013   PLT 227 08/18/2013   Lab Results  Component Value Date   CREATININE 1.29* 08/20/2013   BUN 13 08/20/2013   NA 141 08/20/2013   K 4.0 08/20/2013   CL 106 08/20/2013   CO2 26 08/20/2013    Lab Results  Component Value Date   HGBA1C 16.8* 08/17/2013   Lipid Panel     Component Value Date/Time   CHOL 287* 08/18/2013 0244   TRIG 311* 08/18/2013 0244   HDL 32* 08/18/2013 0244   CHOLHDL 9.0 08/18/2013 0244   VLDL 62* 08/18/2013 0244    LDLCALC 193* 08/18/2013 0244       Assessment and plan:   1. Type 2 diabetes mellitus without complication  - Glucose (CBG)  - metFORMIN (GLUCOPHAGE) 500 MG tablet; Take 1 tablet (500 mg total) by mouth 2 (two) times daily with a meal.  Dispense: 180 tablet; Refill: 3  - Ambulatory referral to Ophthalmology for routine diabetic retinopathy screening  - naphazoline (NAPHCON) 0.1 % ophthalmic solution; Place 1 drop into both eyes 4 (four) times daily as needed for irritation.  Dispense: 15 mL; Refill: 0  2. Essential hypertension  - amLODipine-olmesartan (AZOR) 10-40 MG per tablet; Take 1 tablet by mouth daily.  Dispense: 90 tablet; Refill: 3 DASH diet  3. Dyslipidemia Continue atorvastatin, increased to 20 mg from 10 mg tablet daily Low-cholesterol low-fat diet  Patient was extensively counseled on nutrition and exercise  Return in about 4 weeks (around 09/26/2013), or if symptoms worsen or fail to improve, for Follow up HTN, Hemoglobin A1C and Follow up, DM, Abdominal Pain.  The patient was given clear instructions to go to ER or return to medical center if symptoms don't improve, worsen or new problems develop. The patient verbalized understanding. The patient was told to call to get lab results if they haven't heard anything in the next week.     This note has been created with Surveyor, quantity. Any transcriptional errors are unintentional.    Angelica Chessman, MD, [place], [place], [place], [place]   08/29/2013, 3:11 PM

## 2013-08-29 NOTE — Progress Notes (Signed)
HFU  Pt was in the ED with neuropathy and weakness in her lower extremities. Pt is here to manage and control her diabetes. Pt states that she has a lump/mass bruised area on her left lower abdomen where she said they drew blood on her at the ED

## 2013-09-05 ENCOUNTER — Emergency Department (HOSPITAL_COMMUNITY): Payer: BC Managed Care – PPO

## 2013-09-05 ENCOUNTER — Telehealth: Payer: Self-pay | Admitting: *Deleted

## 2013-09-05 ENCOUNTER — Encounter (HOSPITAL_COMMUNITY): Payer: Self-pay | Admitting: Emergency Medicine

## 2013-09-05 ENCOUNTER — Ambulatory Visit (INDEPENDENT_AMBULATORY_CARE_PROVIDER_SITE_OTHER): Payer: BC Managed Care – PPO | Admitting: Home Health Services

## 2013-09-05 ENCOUNTER — Inpatient Hospital Stay (HOSPITAL_COMMUNITY)
Admission: EM | Admit: 2013-09-05 | Discharge: 2013-09-14 | DRG: 292 | Disposition: A | Discharge status: Home Health | Payer: BC Managed Care – PPO | Attending: Internal Medicine | Admitting: Internal Medicine

## 2013-09-05 VITALS — BP 171/81 | HR 93 | Temp 98.9°F

## 2013-09-05 DIAGNOSIS — Z8249 Family history of ischemic heart disease and other diseases of the circulatory system: Secondary | ICD-10-CM

## 2013-09-05 DIAGNOSIS — E1169 Type 2 diabetes mellitus with other specified complication: Principal | ICD-10-CM

## 2013-09-05 DIAGNOSIS — D649 Anemia, unspecified: Secondary | ICD-10-CM | POA: Diagnosis present

## 2013-09-05 DIAGNOSIS — R0602 Shortness of breath: Secondary | ICD-10-CM

## 2013-09-05 DIAGNOSIS — E1165 Type 2 diabetes mellitus with hyperglycemia: Secondary | ICD-10-CM | POA: Diagnosis not present

## 2013-09-05 DIAGNOSIS — F449 Dissociative and conversion disorder, unspecified: Secondary | ICD-10-CM | POA: Diagnosis present

## 2013-09-05 DIAGNOSIS — R29898 Other symptoms and signs involving the musculoskeletal system: Secondary | ICD-10-CM

## 2013-09-05 DIAGNOSIS — IMO0002 Reserved for concepts with insufficient information to code with codable children: Secondary | ICD-10-CM | POA: Diagnosis not present

## 2013-09-05 DIAGNOSIS — R0902 Hypoxemia: Secondary | ICD-10-CM | POA: Diagnosis present

## 2013-09-05 DIAGNOSIS — D631 Anemia in chronic kidney disease: Secondary | ICD-10-CM | POA: Diagnosis present

## 2013-09-05 DIAGNOSIS — N179 Acute kidney failure, unspecified: Secondary | ICD-10-CM | POA: Diagnosis not present

## 2013-09-05 DIAGNOSIS — R259 Unspecified abnormal involuntary movements: Secondary | ICD-10-CM | POA: Diagnosis present

## 2013-09-05 DIAGNOSIS — B961 Klebsiella pneumoniae [K. pneumoniae] as the cause of diseases classified elsewhere: Secondary | ICD-10-CM | POA: Diagnosis present

## 2013-09-05 DIAGNOSIS — E785 Hyperlipidemia, unspecified: Secondary | ICD-10-CM | POA: Diagnosis present

## 2013-09-05 DIAGNOSIS — F3289 Other specified depressive episodes: Secondary | ICD-10-CM

## 2013-09-05 DIAGNOSIS — N390 Urinary tract infection, site not specified: Secondary | ICD-10-CM | POA: Diagnosis present

## 2013-09-05 DIAGNOSIS — I509 Heart failure, unspecified: Secondary | ICD-10-CM | POA: Diagnosis present

## 2013-09-05 DIAGNOSIS — Z91199 Patient's noncompliance with other medical treatment and regimen due to unspecified reason: Secondary | ICD-10-CM

## 2013-09-05 DIAGNOSIS — Z888 Allergy status to other drugs, medicaments and biological substances status: Secondary | ICD-10-CM

## 2013-09-05 DIAGNOSIS — Z91018 Allergy to other foods: Secondary | ICD-10-CM

## 2013-09-05 DIAGNOSIS — H538 Other visual disturbances: Secondary | ICD-10-CM | POA: Diagnosis present

## 2013-09-05 DIAGNOSIS — I10 Essential (primary) hypertension: Secondary | ICD-10-CM | POA: Diagnosis present

## 2013-09-05 DIAGNOSIS — Z79899 Other long term (current) drug therapy: Secondary | ICD-10-CM

## 2013-09-05 DIAGNOSIS — I5021 Acute systolic (congestive) heart failure: Secondary | ICD-10-CM

## 2013-09-05 DIAGNOSIS — Z9981 Dependence on supplemental oxygen: Secondary | ICD-10-CM

## 2013-09-05 DIAGNOSIS — F411 Generalized anxiety disorder: Secondary | ICD-10-CM | POA: Diagnosis present

## 2013-09-05 DIAGNOSIS — E1149 Type 2 diabetes mellitus with other diabetic neurological complication: Secondary | ICD-10-CM | POA: Diagnosis present

## 2013-09-05 DIAGNOSIS — N183 Chronic kidney disease, stage 3 unspecified: Secondary | ICD-10-CM | POA: Diagnosis present

## 2013-09-05 DIAGNOSIS — I129 Hypertensive chronic kidney disease with stage 1 through stage 4 chronic kidney disease, or unspecified chronic kidney disease: Secondary | ICD-10-CM | POA: Diagnosis present

## 2013-09-05 DIAGNOSIS — G2401 Drug induced subacute dyskinesia: Secondary | ICD-10-CM | POA: Diagnosis present

## 2013-09-05 DIAGNOSIS — Z885 Allergy status to narcotic agent status: Secondary | ICD-10-CM

## 2013-09-05 DIAGNOSIS — E1142 Type 2 diabetes mellitus with diabetic polyneuropathy: Secondary | ICD-10-CM | POA: Diagnosis present

## 2013-09-05 DIAGNOSIS — Z7982 Long term (current) use of aspirin: Secondary | ICD-10-CM

## 2013-09-05 DIAGNOSIS — I5023 Acute on chronic systolic (congestive) heart failure: Principal | ICD-10-CM | POA: Diagnosis present

## 2013-09-05 DIAGNOSIS — Z91010 Allergy to peanuts: Secondary | ICD-10-CM

## 2013-09-05 DIAGNOSIS — N039 Chronic nephritic syndrome with unspecified morphologic changes: Secondary | ICD-10-CM

## 2013-09-05 DIAGNOSIS — Z794 Long term (current) use of insulin: Secondary | ICD-10-CM

## 2013-09-05 DIAGNOSIS — Z9119 Patient's noncompliance with other medical treatment and regimen: Secondary | ICD-10-CM

## 2013-09-05 DIAGNOSIS — E119 Type 2 diabetes mellitus without complications: Secondary | ICD-10-CM

## 2013-09-05 DIAGNOSIS — Z833 Family history of diabetes mellitus: Secondary | ICD-10-CM

## 2013-09-05 LAB — URINALYSIS, ROUTINE W REFLEX MICROSCOPIC
Bilirubin Urine: NEGATIVE
GLUCOSE, UA: NEGATIVE mg/dL
KETONES UR: NEGATIVE mg/dL
Nitrite: NEGATIVE
Protein, ur: >300 mg/dL — AB
Specific Gravity, Urine: 1.01 (ref 1.005–1.030)
Urobilinogen, UA: 1 mg/dL (ref 0.0–1.0)
pH: 7 (ref 5.0–8.0)

## 2013-09-05 LAB — BASIC METABOLIC PANEL
ANION GAP: 10 (ref 5–15)
BUN: 18 mg/dL (ref 6–23)
CO2: 24 mEq/L (ref 19–32)
Calcium: 9.2 mg/dL (ref 8.4–10.5)
Chloride: 106 mEq/L (ref 96–112)
Creatinine, Ser: 1.57 mg/dL — ABNORMAL HIGH (ref 0.50–1.10)
GFR, EST AFRICAN AMERICAN: 39 mL/min — AB (ref >90)
GFR, EST NON AFRICAN AMERICAN: 34 mL/min — AB (ref >90)
GLUCOSE: 98 mg/dL (ref 70–99)
POTASSIUM: 4.4 meq/L (ref 3.7–5.3)
SODIUM: 140 meq/L (ref 137–147)

## 2013-09-05 LAB — POC OCCULT BLOOD, ED: Fecal Occult Bld: NEGATIVE

## 2013-09-05 LAB — URINE MICROSCOPIC-ADD ON

## 2013-09-05 LAB — CBC WITH DIFFERENTIAL/PLATELET
BASOS ABS: 0 10*3/uL (ref 0.0–0.1)
BASOS PCT: 0 % (ref 0–1)
EOS ABS: 0.3 10*3/uL (ref 0.0–0.7)
EOS PCT: 3 % (ref 0–5)
HCT: 28.3 % — ABNORMAL LOW (ref 36.0–46.0)
Hemoglobin: 9.1 g/dL — ABNORMAL LOW (ref 12.0–15.0)
Lymphocytes Relative: 22 % (ref 12–46)
Lymphs Abs: 2.1 10*3/uL (ref 0.7–4.0)
MCH: 28.2 pg (ref 26.0–34.0)
MCHC: 32.2 g/dL (ref 30.0–36.0)
MCV: 87.6 fL (ref 78.0–100.0)
Monocytes Absolute: 0.7 10*3/uL (ref 0.1–1.0)
Monocytes Relative: 7 % (ref 3–12)
NEUTROS PCT: 68 % (ref 43–77)
Neutro Abs: 6.2 10*3/uL (ref 1.7–7.7)
PLATELETS: 266 10*3/uL (ref 150–400)
RBC: 3.23 MIL/uL — AB (ref 3.87–5.11)
RDW: 13.8 % (ref 11.5–15.5)
WBC: 9.2 10*3/uL (ref 4.0–10.5)

## 2013-09-05 LAB — GLUCOSE, CAPILLARY
Glucose-Capillary: 80 mg/dL (ref 70–99)
Glucose-Capillary: 91 mg/dL (ref 70–99)

## 2013-09-05 LAB — CBG MONITORING, ED
GLUCOSE-CAPILLARY: 101 mg/dL — AB (ref 70–99)
GLUCOSE-CAPILLARY: 43 mg/dL — AB (ref 70–99)

## 2013-09-05 MED ORDER — ALBUTEROL SULFATE HFA 108 (90 BASE) MCG/ACT IN AERS
6.0000 | INHALATION_SPRAY | Freq: Once | RESPIRATORY_TRACT | Status: AC
  Administered 2013-09-05: 6 via RESPIRATORY_TRACT
  Filled 2013-09-05: qty 6.7

## 2013-09-05 NOTE — Progress Notes (Signed)
DIABETES Pt came in to have a retinal scan per diabetic care.   Image was taken and submitted to [facility]-DR. Cathren Laine for reading.    Results will be available in 1-2 weeks.  Results will be given to PCP for review and to contact patient.  Vinnie Level

## 2013-09-05 NOTE — ED Notes (Signed)
Pt in c/o blurred vision over the last two weeks and tremors, pt noted to not have control over her body, mouth is drawing to right side, pt was in the hospital two weeks ago for similar symptoms, states they are worse now and thinks she is having a reaction to a new medication

## 2013-09-05 NOTE — Telephone Encounter (Signed)
Jeannette from [facility] called stating this patient is at their office and having "mouth twisting" during retinal eye exam States that daughter said this has been happening for a week. Denies facial drooping, speech or swallowing difficulties. States face is symmetrical and gait is normal. States vitals are as follows: BP 171/81 P 93 T 98.9  Spoke with provider and Jeannette asked to perform CBG to r/o DKA and send to ED if they felt it necessary.  At 1630 patient and daughter showed up at our office.  Patient was visibly having involuntary movements in lobby that included face, head, upper and lower extremities. Per provider, patient and daughter were instructed to head directly to ED. Report given to Rennie Natter ED charge nurse.

## 2013-09-05 NOTE — ED Provider Notes (Signed)
CSN: [id number]     Arrival date & time 09/05/13  1642 History   First MD Initiated Contact with Patient 09/05/13 1845     Chief Complaint  Patient presents with  . Tremors  . Eye Problem     (Consider location/radiation/quality/duration/timing/severity/associated sxs/prior Treatment) HPI Comments: 63 yo female with gradual onset involuntary movements of all 4 extremities and face.  Right leg seems to be most affected.  Symptoms have been constant since her hospitalization, but worsening.   She went to an outpatient appointment today and referred back to her PCPs office regarding these movements.  Ultimately, she was referred to the ED.    Patient is a 63 y.o. female presenting with neurologic complaint.  Neurologic Problem This is a new problem. Episode onset: more than two weeks ago. The problem occurs constantly. The problem has been gradually worsening. Pertinent negatives include no chest pain, no abdominal pain, no headaches and no shortness of breath. Exacerbated by: Worse when blood sugar low.  Does not seem to be worse with anxiety or lack of sleep.  Relieved by: They gave me something in the hospital that helped, but then it stopped working.     Past Medical History  Diagnosis Date  . Hypertension   . Diabetes mellitus   . Hyperlipidemia   . History of noncompliance with medical treatment   . Shingles 08/18/2013   Past Surgical History  Procedure Laterality Date  . Diabetes    . Fibroids of breast     Family History  Problem Relation Age of Onset  . Diabetes    . Hypertension    . Hypertension Father   . Diabetes Father   . Hypertension Mother   . Diabetes Mother   . Diabetes Sister   . Hypertension Sister   . Diabetes Sister   . Hypertension Sister    History  Substance Use Topics  . Smoking status: Never Smoker   . Smokeless tobacco: Never Used  . Alcohol Use: No   OB History   Grav Para Term Preterm Abortions TAB SAB Ect Mult Living   _0 Review of Systems  Respiratory: Negative for shortness of breath.   Cardiovascular: Negative for chest pain.  Gastrointestinal: Negative for abdominal pain.  Neurological: Negative for headaches.  All other systems reviewed and are negative.     Allergies  Codeine; Grapefruit concentrate; Morphine and related; and Peanut-containing drug products  Home Medications   Prior to Admission medications   Medication Sig Start Date End Date Taking? Authorizing Provider  amLODipine-olmesartan (AZOR) 10-40 MG per tablet Take 1 tablet by mouth daily. 08/29/13   Angelica Chessman, MD  aspirin EC 81 MG tablet Take 1 tablet (81 mg total) by mouth daily. 08/20/13   Thurnell Lose, MD  atorvastatin (LIPITOR) 10 MG tablet Take 1 tablet (10 mg total) by mouth daily at 6 PM. 08/20/13   Thurnell Lose, MD  carvedilol (COREG) 6.25 MG tablet Take 1 tablet (6.25 mg total) by mouth 2 (two) times daily with a meal. 08/20/13   Thurnell Lose, MD  cefUROXime (CEFTIN) 500 MG tablet Take 1 tablet (500 mg total) by mouth 2 (two) times daily with a meal. 08/20/13   Thurnell Lose, MD  clonazePAM (KLONOPIN) 0.5 MG tablet Take 1 tablet (0.5 mg total) by mouth at bedtime. 08/20/13   Thurnell Lose, MD  escitalopram (LEXAPRO) 5 MG tablet Take 1  tablet (5 mg total) by mouth daily. 08/20/13   Thurnell Lose, MD  gabapentin (NEURONTIN) 300 MG capsule Take 1 capsule (300 mg total) by mouth 3 (three) times daily. 08/20/13   Thurnell Lose, MD  glucose monitoring kit (FREESTYLE) monitoring kit 1 each by Does not apply route 4 (four) times daily - after meals and at bedtime. 1 month Diabetic Testing Supplies for QAC-QHS accuchecks.Any brand OK 08/20/13   Thurnell Lose, MD  insulin aspart (NOVOLOG) 100 UNIT/ML injection Inject 0-10 Units into the skin 3 (three) times daily with meals. Before each meal 3 times a day, 140-199 - 2 units, 200-250 - 4 units, 251-299 - 6 units,  300-349 - 8 units,  350 or above 10 units. Insulin PEN  if approved, provide syringes and needles if needed. 08/20/13   Thurnell Lose, MD  insulin glargine (LANTUS) 100 UNIT/ML injection Inject 0.2 mLs (20 Units total) into the skin 2 (two) times daily. Take 20 units in the morning and 20 units in the evening 08/20/13   Thurnell Lose, MD  metFORMIN (GLUCOPHAGE) 500 MG tablet Take 1 tablet (500 mg total) by mouth 2 (two) times daily with a meal. 08/29/13   Angelica Chessman, MD  naphazoline (NAPHCON) 0.1 % ophthalmic solution Place 1 drop into both eyes 4 (four) times daily as needed for irritation. 08/29/13   Angelica Chessman, MD   BP 143/77  Pulse 85  Temp(Src) 99 F (37.2 C) (Oral)  Resp 25  Wt 182 lb 5 oz (82.696 kg)  SpO2 96% Physical Exam  Nursing note and vitals reviewed. Constitutional: She is oriented to person, place, and time. She appears well-developed and well-nourished. No distress.  HENT:  Head: Normocephalic and atraumatic.  Mouth/Throat: Oropharynx is clear and moist.  Eyes: Conjunctivae are normal. Pupils are equal, round, and reactive to light. No scleral icterus.  Neck: Neck supple.  Cardiovascular: Normal rate, regular rhythm, normal heart sounds and intact distal pulses.   No murmur heard. Pulmonary/Chest: Effort normal and breath sounds normal. No stridor. No respiratory distress. She has no rales.  Abdominal: Soft. Bowel sounds are normal. She exhibits no distension. There is no tenderness.  Musculoskeletal: Normal range of motion.  Neurological: She is alert and oriented to person, place, and time. She has normal strength. No cranial nerve deficit or sensory deficit. Gait (shuffling) abnormal. Coordination normal. GCS eye subscore is 4. GCS verbal subscore is 5. GCS motor subscore is 6.  Reflex Scores:      Patellar reflexes are 1+ on the right side and 2+ on the left side. Frequent movements of all extremities.  Not controlled when she holds her arms together.  Seem to slightly lessen when distracted with complicated  commands.    Skin: Skin is warm and dry. No rash noted.  Psychiatric: She has a normal mood and affect. Her behavior is normal.    ED Course  Procedures (including critical care time) Labs Review Labs Reviewed  CBC WITH DIFFERENTIAL - Abnormal; Notable for the following:    RBC 3.23 (*)    Hemoglobin 9.1 (*)    HCT 28.3 (*)    All other components within normal limits  BASIC METABOLIC PANEL - Abnormal; Notable for the following:    Creatinine, Ser 1.57 (*)    GFR calc non Af Amer 34 (*)    GFR calc Af Amer 39 (*)    All other components within normal limits  URINALYSIS, ROUTINE W REFLEX MICROSCOPIC -  Abnormal; Notable for the following:    APPearance CLOUDY (*)    Hgb urine dipstick SMALL (*)    Protein, ur >300 (*)    Leukocytes, UA TRACE (*)    All other components within normal limits  URINE MICROSCOPIC-ADD ON - Abnormal; Notable for the following:    Squamous Epithelial / LPF FEW (*)    Bacteria, UA MANY (*)    All other components within normal limits  PRO B NATRIURETIC PEPTIDE - Abnormal; Notable for the following:    Pro B Natriuretic peptide (BNP) 1129.0 (*)    All other components within normal limits  CBG MONITORING, ED - Abnormal; Notable for the following:    Glucose-Capillary 43 (*)    All other components within normal limits  CBG MONITORING, ED - Abnormal; Notable for the following:    Glucose-Capillary 101 (*)    All other components within normal limits  URINE CULTURE  TROPONIN I  POC OCCULT BLOOD, ED  Randolm Idol, ED    Imaging Review Dg Chest 2 View  09/05/2013   CLINICAL DATA:  Shortness of breath.  EXAM: CHEST  2 VIEW  COMPARISON:  08/17/2013.  FINDINGS: The heart is enlarged. Stable tortuosity and ectasia of the thoracic aorta. There is central venous congestion and moderate pulmonary edema. No definite pleural effusions. Low lung volumes with vascular crowding and bibasilar atelectasis. The bony thorax is intact.  IMPRESSION: CHF.    Electronically Signed   By: Kalman Jewels M.D.   On: 09/05/2013 22:46  All radiology studies independently viewed by me.        EKG Interpretation None     See muse  MDM   Final diagnoses:  Shortness of breath  Involuntary movements    Pt presented to the ED from her PCPs office secondary to involuntary muscle movements.  These have been present since her hospitalization, but worsening. Symptoms are not consistent with seizures.  Possibly medication side effects.    During ED course, she was found to be hypoxic.  She subsequently reported increasing SOB for past week with DOE.  CXR obtained and showed pulmonary edema.  She denied purulent cough, fevers, and she had no leukocytosis.  Consulted internal medicine for admission.      Houston Siren III, MD 09/06/13 [phone]

## 2013-09-05 NOTE — ED Notes (Signed)
RN aware of CGB pt given [place] sandwich and sprite zero

## 2013-09-05 NOTE — ED Notes (Signed)
CBG 65, pt given sprite and [place] sandwich.

## 2013-09-05 NOTE — Progress Notes (Signed)
Patient here today for retinal scan and was c/o facial twisting (twisting lower lip) x 1 week.  States she was started on several new meds after recent hospital discharge this month.  No facial asymetry, numbness, tingling, facial drooping, pain.  Called [facility]--spoke with Lauren, RN and she discussed patient's symptoms with Dr. Doreene Burke.  Will check patient's CBG and call office back with advice.  CBG-80 and patient states it was 100 this morning.  Attempted to call [place] back multiple times and no answer.  Rechecked CBG at 4:00 pm--91.  Patient will go to [facility] and schedule a followup appt with Dr. Doreene Burke.  Burna Forts, BSN, RN-BC

## 2013-09-06 ENCOUNTER — Encounter (HOSPITAL_COMMUNITY): Payer: Self-pay | Admitting: Internal Medicine

## 2013-09-06 DIAGNOSIS — N390 Urinary tract infection, site not specified: Secondary | ICD-10-CM | POA: Diagnosis present

## 2013-09-06 DIAGNOSIS — Z9119 Patient's noncompliance with other medical treatment and regimen: Secondary | ICD-10-CM | POA: Diagnosis not present

## 2013-09-06 DIAGNOSIS — G2401 Drug induced subacute dyskinesia: Secondary | ICD-10-CM | POA: Diagnosis present

## 2013-09-06 DIAGNOSIS — Z91199 Patient's noncompliance with other medical treatment and regimen due to unspecified reason: Secondary | ICD-10-CM | POA: Diagnosis not present

## 2013-09-06 DIAGNOSIS — I509 Heart failure, unspecified: Secondary | ICD-10-CM | POA: Diagnosis present

## 2013-09-06 DIAGNOSIS — D649 Anemia, unspecified: Secondary | ICD-10-CM | POA: Diagnosis present

## 2013-09-06 DIAGNOSIS — H538 Other visual disturbances: Secondary | ICD-10-CM | POA: Diagnosis present

## 2013-09-06 DIAGNOSIS — Z9981 Dependence on supplemental oxygen: Secondary | ICD-10-CM | POA: Diagnosis not present

## 2013-09-06 DIAGNOSIS — I5021 Acute systolic (congestive) heart failure: Secondary | ICD-10-CM

## 2013-09-06 DIAGNOSIS — R259 Unspecified abnormal involuntary movements: Secondary | ICD-10-CM

## 2013-09-06 DIAGNOSIS — F411 Generalized anxiety disorder: Secondary | ICD-10-CM | POA: Diagnosis present

## 2013-09-06 DIAGNOSIS — F449 Dissociative and conversion disorder, unspecified: Secondary | ICD-10-CM | POA: Diagnosis present

## 2013-09-06 DIAGNOSIS — E1149 Type 2 diabetes mellitus with other diabetic neurological complication: Secondary | ICD-10-CM

## 2013-09-06 DIAGNOSIS — Z91018 Allergy to other foods: Secondary | ICD-10-CM | POA: Diagnosis not present

## 2013-09-06 DIAGNOSIS — Z79899 Other long term (current) drug therapy: Secondary | ICD-10-CM | POA: Diagnosis not present

## 2013-09-06 DIAGNOSIS — I5023 Acute on chronic systolic (congestive) heart failure: Secondary | ICD-10-CM | POA: Diagnosis present

## 2013-09-06 DIAGNOSIS — B961 Klebsiella pneumoniae [K. pneumoniae] as the cause of diseases classified elsewhere: Secondary | ICD-10-CM | POA: Diagnosis present

## 2013-09-06 DIAGNOSIS — E1142 Type 2 diabetes mellitus with diabetic polyneuropathy: Secondary | ICD-10-CM | POA: Diagnosis present

## 2013-09-06 DIAGNOSIS — IMO0002 Reserved for concepts with insufficient information to code with codable children: Secondary | ICD-10-CM | POA: Diagnosis not present

## 2013-09-06 DIAGNOSIS — Z888 Allergy status to other drugs, medicaments and biological substances status: Secondary | ICD-10-CM | POA: Diagnosis not present

## 2013-09-06 DIAGNOSIS — N183 Chronic kidney disease, stage 3 unspecified: Secondary | ICD-10-CM | POA: Diagnosis present

## 2013-09-06 DIAGNOSIS — R0602 Shortness of breath: Secondary | ICD-10-CM

## 2013-09-06 DIAGNOSIS — Z91010 Allergy to peanuts: Secondary | ICD-10-CM | POA: Diagnosis not present

## 2013-09-06 DIAGNOSIS — E785 Hyperlipidemia, unspecified: Secondary | ICD-10-CM | POA: Diagnosis present

## 2013-09-06 DIAGNOSIS — R0902 Hypoxemia: Secondary | ICD-10-CM | POA: Diagnosis present

## 2013-09-06 DIAGNOSIS — E1165 Type 2 diabetes mellitus with hyperglycemia: Secondary | ICD-10-CM | POA: Diagnosis not present

## 2013-09-06 DIAGNOSIS — Z7982 Long term (current) use of aspirin: Secondary | ICD-10-CM | POA: Diagnosis not present

## 2013-09-06 DIAGNOSIS — I129 Hypertensive chronic kidney disease with stage 1 through stage 4 chronic kidney disease, or unspecified chronic kidney disease: Secondary | ICD-10-CM | POA: Diagnosis present

## 2013-09-06 DIAGNOSIS — Z885 Allergy status to narcotic agent status: Secondary | ICD-10-CM | POA: Diagnosis not present

## 2013-09-06 DIAGNOSIS — N179 Acute kidney failure, unspecified: Secondary | ICD-10-CM | POA: Diagnosis not present

## 2013-09-06 DIAGNOSIS — Z833 Family history of diabetes mellitus: Secondary | ICD-10-CM | POA: Diagnosis not present

## 2013-09-06 DIAGNOSIS — Z8249 Family history of ischemic heart disease and other diseases of the circulatory system: Secondary | ICD-10-CM | POA: Diagnosis not present

## 2013-09-06 DIAGNOSIS — Z794 Long term (current) use of insulin: Secondary | ICD-10-CM | POA: Diagnosis not present

## 2013-09-06 DIAGNOSIS — D631 Anemia in chronic kidney disease: Secondary | ICD-10-CM | POA: Diagnosis present

## 2013-09-06 LAB — I-STAT TROPONIN, ED: TROPONIN I, POC: 0 ng/mL (ref 0.00–0.08)

## 2013-09-06 LAB — GLUCOSE, CAPILLARY
GLUCOSE-CAPILLARY: 208 mg/dL — AB (ref 70–99)
GLUCOSE-CAPILLARY: 190 mg/dL — AB (ref 70–99)
GLUCOSE-CAPILLARY: 148 mg/dL — AB (ref 70–99)
Glucose-Capillary: 65 mg/dL — ABNORMAL LOW (ref 70–99)
Glucose-Capillary: 85 mg/dL (ref 70–99)
Glucose-Capillary: 172 mg/dL — ABNORMAL HIGH (ref 70–99)

## 2013-09-06 LAB — TROPONIN I: Troponin I: <0.3 ng/mL (ref <0.30)

## 2013-09-06 LAB — PRO B NATRIURETIC PEPTIDE: Pro B Natriuretic peptide (BNP): 1129 pg/mL — ABNORMAL HIGH (ref 0–125)

## 2013-09-06 MED ORDER — METHOCARBAMOL 500 MG PO TABS
500.0000 mg | ORAL_TABLET | Freq: Three times a day (TID) | ORAL | Status: DC | PRN
  Administered 2013-09-06: 500 mg via ORAL
  Filled 2013-09-06 (×3): qty 1

## 2013-09-06 MED ORDER — CARVEDILOL 6.25 MG PO TABS
6.2500 mg | ORAL_TABLET | Freq: Two times a day (BID) | ORAL | Status: DC
  Administered 2013-09-06 – 2013-09-14 (×16): 6.25 mg via ORAL
  Filled 2013-09-06 (×19): qty 1

## 2013-09-06 MED ORDER — CIPROFLOXACIN IN D5W 400 MG/200ML IV SOLN
400.0000 mg | Freq: Once | INTRAVENOUS | Status: AC
  Administered 2013-09-06: 400 mg via INTRAVENOUS
  Filled 2013-09-06: qty 200

## 2013-09-06 MED ORDER — INSULIN GLARGINE 100 UNIT/ML ~~LOC~~ SOLN
20.0000 [IU] | Freq: Two times a day (BID) | SUBCUTANEOUS | Status: DC
  Administered 2013-09-06 – 2013-09-08 (×6): 20 [IU] via SUBCUTANEOUS
  Filled 2013-09-06 (×8): qty 0.2

## 2013-09-06 MED ORDER — DEXTROSE 5 % IV SOLN
1.0000 g | Freq: Every day | INTRAVENOUS | Status: DC
  Administered 2013-09-06 – 2013-09-07 (×2): 1 g via INTRAVENOUS
  Filled 2013-09-06 (×3): qty 10

## 2013-09-06 MED ORDER — ONDANSETRON HCL 4 MG/2ML IJ SOLN: 4.0000 mg | Freq: Four times a day (QID) | INTRAMUSCULAR | Status: DC | PRN

## 2013-09-06 MED ORDER — SODIUM CHLORIDE 0.9 % IV SOLN
Freq: Once | INTRAVENOUS | Status: AC
  Administered 2013-09-06: 01:00:00 via INTRAVENOUS

## 2013-09-06 MED ORDER — SODIUM CHLORIDE 0.9 % IV SOLN: INTRAVENOUS | Status: DC

## 2013-09-06 MED ORDER — AMLODIPINE-OLMESARTAN 10-40 MG PO TABS: 1.0000 | ORAL_TABLET | Freq: Every day | ORAL | Status: DC

## 2013-09-06 MED ORDER — SODIUM CHLORIDE 0.9 % IJ SOLN
3.0000 mL | Freq: Two times a day (BID) | INTRAMUSCULAR | Status: DC
  Administered 2013-09-08 – 2013-09-12 (×6): 3 mL via INTRAVENOUS

## 2013-09-06 MED ORDER — ACETAMINOPHEN 325 MG PO TABS
650.0000 mg | ORAL_TABLET | Freq: Four times a day (QID) | ORAL | Status: DC | PRN
  Administered 2013-09-08 – 2013-09-13 (×3): 650 mg via ORAL
  Filled 2013-09-06 (×3): qty 2

## 2013-09-06 MED ORDER — ATORVASTATIN CALCIUM 10 MG PO TABS
10.0000 mg | ORAL_TABLET | Freq: Every day | ORAL | Status: DC
  Administered 2013-09-06 – 2013-09-11 (×5): 10 mg via ORAL
  Filled 2013-09-06 (×9): qty 1

## 2013-09-06 MED ORDER — IRBESARTAN 300 MG PO TABS
300.0000 mg | ORAL_TABLET | Freq: Every day | ORAL | Status: DC
  Administered 2013-09-06 – 2013-09-07 (×2): 300 mg via ORAL
  Filled 2013-09-06 (×3): qty 1

## 2013-09-06 MED ORDER — AMLODIPINE BESYLATE 10 MG PO TABS
10.0000 mg | ORAL_TABLET | Freq: Every day | ORAL | Status: DC
  Administered 2013-09-06 – 2013-09-08 (×3): 10 mg via ORAL
  Filled 2013-09-06 (×4): qty 1

## 2013-09-06 MED ORDER — ENOXAPARIN SODIUM 40 MG/0.4ML ~~LOC~~ SOLN
40.0000 mg | SUBCUTANEOUS | Status: DC
  Administered 2013-09-06 – 2013-09-14 (×8): 40 mg via SUBCUTANEOUS
  Filled 2013-09-06 (×9): qty 0.4

## 2013-09-06 MED ORDER — INSULIN ASPART 100 UNIT/ML ~~LOC~~ SOLN
0.0000 [IU] | Freq: Three times a day (TID) | SUBCUTANEOUS | Status: DC
  Administered 2013-09-06: 2 [IU] via SUBCUTANEOUS
  Administered 2013-09-06: 3 [IU] via SUBCUTANEOUS
  Administered 2013-09-07: 2 [IU] via SUBCUTANEOUS
  Administered 2013-09-08: 1 [IU] via SUBCUTANEOUS
  Administered 2013-09-11: 2 [IU] via SUBCUTANEOUS
  Administered 2013-09-12: 1 [IU] via SUBCUTANEOUS
  Administered 2013-09-12 – 2013-09-13 (×2): 2 [IU] via SUBCUTANEOUS
  Administered 2013-09-13: 1 [IU] via SUBCUTANEOUS

## 2013-09-06 MED ORDER — FUROSEMIDE 10 MG/ML IJ SOLN
40.0000 mg | Freq: Every day | INTRAMUSCULAR | Status: DC
Start: 2013-09-06 — End: 2013-09-08
  Administered 2013-09-06 – 2013-09-07 (×2): 40 mg via INTRAVENOUS
  Filled 2013-09-06 (×4): qty 4

## 2013-09-06 MED ORDER — ONDANSETRON HCL 4 MG PO TABS: 4.0000 mg | ORAL_TABLET | Freq: Four times a day (QID) | ORAL | Status: DC | PRN

## 2013-09-06 MED ORDER — ASPIRIN EC 81 MG PO TBEC
81.0000 mg | DELAYED_RELEASE_TABLET | Freq: Every day | ORAL | Status: DC
  Administered 2013-09-06 – 2013-09-14 (×9): 81 mg via ORAL
  Filled 2013-09-06 (×9): qty 1

## 2013-09-06 MED ORDER — FUROSEMIDE 10 MG/ML IJ SOLN
20.0000 mg | Freq: Once | INTRAMUSCULAR | Status: AC
  Administered 2013-09-06: 20 mg via INTRAVENOUS
  Filled 2013-09-06: qty 2

## 2013-09-06 MED ORDER — CLONAZEPAM 0.5 MG PO TABS
0.5000 mg | ORAL_TABLET | Freq: Every day | ORAL | Status: DC
  Administered 2013-09-06: 0.5 mg via ORAL
  Filled 2013-09-06: qty 1

## 2013-09-06 MED ORDER — SODIUM CHLORIDE 0.9 % IJ SOLN
3.0000 mL | Freq: Two times a day (BID) | INTRAMUSCULAR | Status: DC
  Administered 2013-09-06 – 2013-09-14 (×6): 3 mL via INTRAVENOUS

## 2013-09-06 MED ORDER — ACETAMINOPHEN 650 MG RE SUPP
650.0000 mg | Freq: Four times a day (QID) | RECTAL | Status: DC | PRN
Start: 2013-09-06 — End: 2013-09-06

## 2013-09-06 NOTE — Progress Notes (Signed)
UR Completed Riku Buttery Graves-Bigelow, RN,BSN [phone]  

## 2013-09-06 NOTE — H&P (Addendum)
[facility] History and Physical  [name] XKG:[id number] DOB: 1950/12/19 DOA: 09/05/2013  Referring physician: ER physician. PCP: Angelica Chessman, MD   Chief Complaint: Shortness of breath. Abnormal movements of the extremities.  HPI: [name] is a 63 y.o. female with history of diabetes mellitus, hypertension, chronic kidney disease who was recently admitted to the hospital for lower extremity weakness and had MRI brain and thoracic spine which were unremarkable and patient's symptoms are felt to be secondary to neuropathy. Patient was discharged home on Neurontin and also on Lexapro and Klonopin for anxiety disorder. During the stay patient had 2-D echo which showed EF of 40-45%. Patient states since she got discharged she has been having increasing involuntary movements of her extremities. Over the last 3-4 days patient has been having increasing shortness of breath on exertion. Denies any chest pain productive cough fever chills nausea vomiting abdominal pain or diarrhea. In the ER chest x-ray shows features consistent with CHF. Patient has been admitted for further workup. On my exam patient is resting comfortably with some abnormal involuntary movements of the upper extremities and face. She also has mild twitching of the lower extremities. Patient otherwise is well oriented to time place and person and follows commands and moves extremities but good reflexes.   Review of Systems: As presented in the history of presenting illness, rest negative.  Past Medical History  Diagnosis Date  . Hypertension   . Diabetes mellitus   . Hyperlipidemia   . History of noncompliance with medical treatment   . Shingles 08/18/2013   Past Surgical History  Procedure Laterality Date  . Diabetes    . Fibroids of breast     Social History:  reports that she has never smoked. She has never used smokeless tobacco. She reports that she does not drink alcohol or use illicit  drugs. Where does patient live home. Can patient participate in ADLs? Yes.  Allergies  Allergen Reactions  . Codeine Nausea And Vomiting  . Grapefruit Concentrate Itching  . Morphine And Related Other (See Comments)    Extreme sedation  . Peanut-Containing Drug Products Nausea And Vomiting    Family History:  Family History  Problem Relation Age of Onset  . Diabetes    . Hypertension    . Hypertension Father   . Diabetes Father   . Hypertension Mother   . Diabetes Mother   . Diabetes Sister   . Hypertension Sister   . Diabetes Sister   . Hypertension Sister       Prior to Admission medications   Medication Sig Start Date End Date Taking? Authorizing Provider  amLODipine-olmesartan (AZOR) 10-40 MG per tablet Take 1 tablet by mouth daily. 08/29/13  Yes Angelica Chessman, MD  aspirin EC 81 MG tablet Take 1 tablet (81 mg total) by mouth daily. 08/20/13  Yes Thurnell Lose, MD  atorvastatin (LIPITOR) 10 MG tablet Take 1 tablet (10 mg total) by mouth daily at 6 PM. 08/20/13  Yes Thurnell Lose, MD  carvedilol (COREG) 6.25 MG tablet Take 1 tablet (6.25 mg total) by mouth 2 (two) times daily with a meal. 08/20/13  Yes Thurnell Lose, MD  clonazePAM (KLONOPIN) 0.5 MG tablet Take 1 tablet (0.5 mg total) by mouth at bedtime. 08/20/13  Yes Thurnell Lose, MD  escitalopram (LEXAPRO) 5 MG tablet Take 5 mg by mouth at bedtime.   Yes Historical Provider, MD  gabapentin (NEURONTIN) 300 MG capsule Take 1 capsule (300 mg total) by  mouth 3 (three) times daily. 08/20/13  Yes Thurnell Lose, MD  insulin aspart (NOVOLOG) 100 UNIT/ML injection Inject 0-10 Units into the skin 3 (three) times daily with meals. Before each meal 3 times a day, 140-199 - 2 units, 200-250 - 4 units, 251-299 - 6 units,  300-349 - 8 units,  350 or above 10 units. Insulin PEN if approved, provide syringes and needles if needed. 08/20/13  Yes Thurnell Lose, MD  insulin glargine (LANTUS) 100 UNIT/ML injection Inject 0.2 mLs (20  Units total) into the skin 2 (two) times daily. Take 20 units in the morning and 20 units in the evening 08/20/13  Yes Thurnell Lose, MD  metFORMIN (GLUCOPHAGE) 500 MG tablet Take 1 tablet (500 mg total) by mouth 2 (two) times daily with a meal. 08/29/13  Yes Angelica Chessman, MD  Tetrahydrozoline HCl (VISINE OP) Place 1 drop into both eyes daily as needed (dry eyes).   Yes Historical Provider, MD  glucose monitoring kit (FREESTYLE) monitoring kit 1 each by Does not apply route 4 (four) times daily - after meals and at bedtime. 1 month Diabetic Testing Supplies for QAC-QHS accuchecks.Any brand OK 08/20/13   Thurnell Lose, MD    Physical Exam: Filed Vitals:   09/06/13 0030 09/06/13 0045 09/06/13 0100 09/06/13 0204  BP: 167/63 168/94 160/88 139/92  Pulse: 113 110 103 117  Temp:    99.5 F (37.5 C)  TempSrc:    Oral  Resp: 28 34 18 20  Height:    '5\' 6"'$  (1.676 m)  Weight:    81.285 kg (179 lb 3.2 oz)  SpO2: 93% 94% 100% 96%     General:  Well-developed and nourished.  Eyes: Anicteric no pallor.  ENT: No discharge from the ears eyes nose mouth.  Neck: No mass felt no neck rigidity.  Cardiovascular: S1-S2 heard.  Respiratory: No rhonchi or crepitations.  Abdomen: Soft nontender bowel sounds present no guarding rigidity.  Skin: No rash.  Musculoskeletal: No edema.  Psychiatric: Appears normal.  Neurologic: Alert awake oriented to time place and person. Patient has involuntary movements of the upper and lower extremities more on the upper extremities. Moves all extremities. Has good grip strength. PERRLA positive. No facial asymmetry.  Labs on Admission:  Basic Metabolic Panel:  Recent Labs Lab 09/05/13 1913  NA 140  K 4.4  CL 106  CO2 24  GLUCOSE 98  BUN 18  CREATININE 1.57*  CALCIUM 9.2   Liver Function Tests: No results found for this basename: AST, ALT, ALKPHOS, BILITOT, PROT, ALBUMIN,  in the last 168 hours No results found for this basename: LIPASE,  AMYLASE,  in the last 168 hours No results found for this basename: AMMONIA,  in the last 168 hours CBC:  Recent Labs Lab 09/05/13 1913  WBC 9.2  NEUTROABS 6.2  HGB 9.1*  HCT 28.3*  MCV 87.6  PLT 266   Cardiac Enzymes:  Recent Labs Lab 09/06/13 0035  TROPONINI <0.30    BNP (last 3 results)  Recent Labs  09/06/13 0035  PROBNP 1129.0*   CBG:  Recent Labs Lab 09/05/13 1536 09/05/13 1600 09/05/13 1750 09/05/13 1829  GLUCAP 80 91 43* 101*    Radiological Exams on Admission: Dg Chest 2 View  09/05/2013   CLINICAL DATA:  Shortness of breath.  EXAM: CHEST  2 VIEW  COMPARISON:  08/17/2013.  FINDINGS: The heart is enlarged. Stable tortuosity and ectasia of the thoracic aorta. There is central venous congestion and  moderate pulmonary edema. No definite pleural effusions. Low lung volumes with vascular crowding and bibasilar atelectasis. The bony thorax is intact.  IMPRESSION: CHF.   Electronically Signed   By: Kalman Jewels M.D.   On: 09/05/2013 22:46    EKG: Independently reviewed. Sinus tachycardia with LVH and early repolarization.  Assessment/Plan Principal Problem:   Systolic CHF, acute Active Problems:   HYPERTENSION   Diabetes mellitus type 2, uncontrolled   Involuntary movements   CKD (chronic kidney disease), stage III   Anemia   UTI (lower urinary tract infection)   CHF (congestive heart failure)   1. Decompensated systolic heart failure - last EF measured during this month was 40-45%. Patient had received Lasix 20 mg IV in the ER. I have placed patient on Lasix 40 IV daily. Restrict fluids. Check daily weights intake output and metabolic panel. Patient is on ARB. 2. Involuntary movements - I have discontinued patient's Neurontin and Lexapro for now. We'll continue Klonopin. May consult neurologist in a.m. for further recommendations. 3. UTI - check urine cultures. Patient is on ceftriaxone. 4. Anemia - patient's hemoglobin has further decreased.  Patient has had recent anemia panel which showed ferritin of around 80. Closely follow CBC. 5. Chronic kidney disease with mild worsening - since patient is on Lasix closely follow intake output and metabolic panel. 6. Diabetes mellitus - hold metformin due to renal failure. Continue home medications otherwise. 7. Hypertension - if there is any further worsening of renal function hold ARB.    Code Status: Full code.  Family Communication: None.  Disposition Plan: Admit to inpatient.    Kaevon Cotta N. [facility] Pager [phone].  If 7PM-7AM, please contact night-coverage [email or web address] Password [facility] 09/06/2013, 2:56 AM

## 2013-09-06 NOTE — Care Management Note (Addendum)
    Page 1 of 2   09/13/2013     12:10:10 PM CARE MANAGEMENT NOTE 09/13/2013  Patient:  [name], [name]   Account Number:  [id number]  Date Initiated:  09/06/2013  Documentation initiated by:  GRAVES-BIGELOW,Yaman Grauberger  Subjective/Objective Assessment:   Pt admitted for CHF exacerbation and +UTI. Initiated on IV Lasix and Iv Rocephin.     Action/Plan:   PCP: JEGEDE, OLUGBEMIGA @ the [facility]. CM will continue to monitor for disposition needs.   Anticipated DC Date:  09/08/2013   Anticipated DC Plan:  [place]  CM consult      [facility] Choice  Resumption Of Svcs/PTA Provider   Choice offered to / List presented to:  C-1 Patient        [place] arranged  HH-1 RN  [place]      [place].   Status of service:  Completed, signed off Medicare Important Message given?  NO (If response is "NO", the following Medicare IM given date fields will be blank) Date Medicare IM given:   Medicare IM given by:   Date Additional Medicare IM given:   Additional Medicare IM given by:    Discharge Disposition:  [place]  Per UR Regulation:  Reviewed for med. necessity/level of care/duration of stay  If discussed at [place] of Stay Meetings, dates discussed:   09/13/2013    Comments:  09-13-13 [place], RN, BSN [phone] CM did speak to family in ref to disposition needs- family agreeable to [facility] services with [place], SW and PT services. CM did provide family with Personal Care Provider list and family aware has to pay out of pocket. CM- will call the [facility] and [place] to see if they can assist and they do have coupons available for 25.00 solistar pens for lantus. MD to write Rx at d/c for pens and pt will be able to get medications filled via the [facility]. MD to fax Rx to Clinic.  09-13-13 [address] RN, BSN [phone] CM to monitor DME needs. Pt 02 sats dropped to 86% ra overnight.    09-09-13 [place], [place] [phone] Pt is currently active with [facility] with HHPT services. Pt will need HHRN for Disease and medication management. CM did make referral with [facility] for services. MD please write orders for HHRN/PT once medically stable for d/c. Per pt she has a RW and cane at home. Upon discussion today pt states she has new knee pain. CM did make RN aware and asked for PTconsult. No further needs from CM at this time.

## 2013-09-06 NOTE — Progress Notes (Signed)
Patient admitted earlier today with chief complaints of decompensated combined acute on chronic systolic and diastolic heart failure EF 40-45%, UTI with fevers, likely side effect of Neurontin with some involuntary movements of the extremities.   She is much better this morning no shortness of breath, continue IV Lasix for CHF along with fluid and salt restriction.   Involuntary movements secondary to Neurontin and possibly Lexapro toxicity better both held. Patient feels much better.   UTI. On Rocephin and monitor cultures.   Monitor her chronic medical issues which include CK D. stage III, diabetes mellitus type 2, hypertension and anemia of chronic disease.

## 2013-09-07 DIAGNOSIS — E119 Type 2 diabetes mellitus without complications: Secondary | ICD-10-CM

## 2013-09-07 DIAGNOSIS — F3289 Other specified depressive episodes: Secondary | ICD-10-CM

## 2013-09-07 DIAGNOSIS — E1165 Type 2 diabetes mellitus with hyperglycemia: Secondary | ICD-10-CM

## 2013-09-07 DIAGNOSIS — N183 Chronic kidney disease, stage 3 unspecified: Secondary | ICD-10-CM

## 2013-09-07 DIAGNOSIS — IMO0001 Reserved for inherently not codable concepts without codable children: Secondary | ICD-10-CM

## 2013-09-07 DIAGNOSIS — I10 Essential (primary) hypertension: Secondary | ICD-10-CM

## 2013-09-07 LAB — GLUCOSE, CAPILLARY
GLUCOSE-CAPILLARY: 195 mg/dL — AB (ref 70–99)
GLUCOSE-CAPILLARY: 135 mg/dL — AB (ref 70–99)
GLUCOSE-CAPILLARY: 57 mg/dL — AB (ref 70–99)
Glucose-Capillary: 116 mg/dL — ABNORMAL HIGH (ref 70–99)
Glucose-Capillary: 107 mg/dL — ABNORMAL HIGH (ref 70–99)
Glucose-Capillary: 112 mg/dL — ABNORMAL HIGH (ref 70–99)

## 2013-09-07 MED ORDER — CLONAZEPAM 0.5 MG PO TABS: 0.5000 mg | ORAL_TABLET | Freq: Every day | ORAL | Status: DC

## 2013-09-07 MED ORDER — CLONAZEPAM 0.5 MG PO TABS
0.5000 mg | ORAL_TABLET | ORAL | Status: DC
  Administered 2013-09-07 – 2013-09-10 (×6): 0.5 mg via ORAL
  Filled 2013-09-07 (×6): qty 1

## 2013-09-07 NOTE — Consult Note (Signed)
NEURO HOSPITALIST CONSULT NOTE    Reason for Consult: movement disorder  HPI:                                                                                                                                          [name] is an 63 y.o. female who was last seen in hospital 7 31/15 for LE weakness.  At that time patient had a oral facial twisting like motion that she could not control but no UE or LE movements.  Neurology was consulted for weakness. MRI was obtained showing no abnormality.  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 Patient returns to hospital due to increased SOB.  On readmission it was noted patient had increasing facial movements in addition to bilateral UE and LE movements.  Patient states these movements have been progressively worsening over the last 2 months. She is unable to control her right leg from intermittently extending and both her arms will suddenly flail. She again denies taking any antipsychotic medications in the past and upon review of charts I do not see any listed. She is already on Klonopine 0.5 mg by mouth at bed time. She denies any family history of movement disorders.    Past Medical History  Diagnosis Date  . Hypertension   . Diabetes mellitus   . Hyperlipidemia   . History of noncompliance with medical treatment   . Shingles 08/18/2013    Past Surgical History  Procedure Laterality Date  . Diabetes    . Fibroids of breast      Family History  Problem Relation Age of Onset  . Diabetes    . Hypertension    . Hypertension Father   . Diabetes Father   . Hypertension Mother   . Diabetes Mother   . Diabetes Sister   . Hypertension Sister   . Diabetes Sister   . Hypertension Sister      Social History:   reports that she has never smoked. She has never used smokeless tobacco. She reports that she does not drink alcohol or use illicit drugs.  Allergies  Allergen Reactions  . Codeine Nausea And Vomiting  . Grapefruit Concentrate Itching  . Morphine And Related Other (See Comments)    Extreme sedation  . Peanut-Containing Drug Products Nausea And Vomiting    MEDICATIONS:  Prior to Admission:  Prescriptions prior to admission  Medication Sig Dispense Refill  . amLODipine-olmesartan (AZOR) 10-40 MG per tablet Take 1 tablet by mouth daily.  90 tablet  3  . aspirin EC 81 MG tablet Take 1 tablet (81 mg total) by mouth daily.  30 tablet  0  . atorvastatin (LIPITOR) 10 MG tablet Take 1 tablet (10 mg total) by mouth daily at 6 PM.  30 tablet  0  . carvedilol (COREG) 6.25 MG tablet Take 1 tablet (6.25 mg total) by mouth 2 (two) times daily with a meal.  60 tablet  0  . clonazePAM (KLONOPIN) 0.5 MG tablet Take 1 tablet (0.5 mg total) by mouth at bedtime.  30 tablet  0  . escitalopram (LEXAPRO) 5 MG tablet Take 5 mg by mouth at bedtime.      . gabapentin (NEURONTIN) 300 MG capsule Take 1 capsule (300 mg total) by mouth 3 (three) times daily.  90 capsule  0  . insulin aspart (NOVOLOG) 100 UNIT/ML injection Inject 0-10 Units into the skin 3 (three) times daily with meals. Before each meal 3 times a day, 140-199 - 2 units, 200-250 - 4 units, 251-299 - 6 units,  300-349 - 8 units,  350 or above 10 units. Insulin PEN if approved, provide syringes and needles if needed.  10 mL  11  . insulin glargine (LANTUS) 100 UNIT/ML injection Inject 0.2 mLs (20 Units total) into the skin 2 (two) times daily. Take 20 units in the morning and 20 units in the evening  10 mL  11  . metFORMIN (GLUCOPHAGE) 500 MG tablet Take 1 tablet (500 mg total) by mouth 2 (two) times daily with a meal.  180 tablet  3  .  Tetrahydrozoline HCl (VISINE OP) Place 1 drop into both eyes daily as needed (dry eyes).      Marland Kitchen glucose monitoring kit (FREESTYLE) monitoring kit 1 each by Does not apply route 4 (four) times daily - after meals and at bedtime. 1 month Diabetic Testing Supplies for QAC-QHS accuchecks.Any brand OK  1 each  1   Scheduled: . amLODipine  10 mg Oral Daily   And  . irbesartan  300 mg Oral Daily  . aspirin EC  81 mg Oral Daily  . atorvastatin  10 mg Oral q1800  . carvedilol  6.25 mg Oral BID WC  . cefTRIAXone (ROCEPHIN)  IV  1 g Intravenous Daily  . clonazePAM  0.5 mg Oral QHS  . enoxaparin (LOVENOX) injection  40 mg Subcutaneous Q24H  . furosemide  40 mg Intravenous Daily  . insulin aspart  0-9 Units Subcutaneous TID WC  . insulin glargine  20 Units Subcutaneous BID  . sodium chloride  3 mL Intravenous Q12H  . sodium chloride  3 mL Intravenous Q12H     ROS:  History obtained from the patient  General ROS: negative for - chills, fatigue, fever, night sweats, weight gain or weight loss Psychological ROS: negative for - behavioral disorder, hallucinations, memory difficulties, mood swings or suicidal ideation Ophthalmic ROS: negative for - blurry vision, double vision, eye pain or loss of vision ENT ROS: negative for - epistaxis, nasal discharge, oral lesions, sore throat, tinnitus or vertigo Allergy and Immunology ROS: negative for - hives or itchy/watery eyes Hematological and Lymphatic ROS: negative for - bleeding problems, bruising or swollen lymph nodes Endocrine ROS: negative for - galactorrhea, hair pattern changes, polydipsia/polyuria or temperature intolerance Respiratory ROS: negative for - cough, hemoptysis, shortness of breath or wheezing Cardiovascular ROS: negative for - chest pain, dyspnea on exertion, edema or irregular heartbeat Gastrointestinal  ROS: negative for - abdominal pain, diarrhea, hematemesis, nausea/vomiting or stool incontinence Genito-Urinary ROS: negative for - dysuria, hematuria, incontinence or urinary frequency/urgency Musculoskeletal ROS: negative for - joint swelling or muscular weakness Neurological ROS: as noted in HPI Dermatological ROS: negative for rash and skin lesion changes   Blood pressure 103/62, pulse 91, temperature 98.2 F (36.8 C), temperature source Oral, resp. rate 18, height $RemoveBe'5\' 6"'laUnGtDIF$  (1.676 m), weight 78.2 kg (172 lb 6.4 oz), SpO2 96.00%.   Neurologic Examination:                                                                                                      General: NAD Mental Status: Alert, oriented, thought content appropriate.  Speech fluent without evidence of aphasia.  Able to follow 3 step commands without difficulty. Cranial Nerves: II: Discs flat bilaterally; Visual fields grossly normal, pupils equal, round, reactive to light and accommodation III,IV, VI: ptosis not present, extra-ocular motions intact bilaterally V,VII: smile symmetric with intermittent facial twisting, retracting corners of her mouth,  facial light touch sensation normal bilaterally VIII: hearing normal bilaterally IX,X: gag reflex present XI: bilateral shoulder shrug XII: midline tongue extension, twisting of tongue.   Motor: Right : Upper extremity   5/5    Left:     Upper extremity   5/5  Lower extremity   5/5     Lower extremity   5/5 --Intermittent flailing and twisting of her arms left arm greater than right --Right leg shows intermittent quad extension which is decreased when asked to take part in movement but not fully abated.  Tone and bulk:normal tone throughout; no atrophy noted Sensory: decreased vibratory and temperature sensation in lower extremities.  Deep Tendon Reflexes:  Right: Upper Extremity   Left: Upper extremity   biceps (C-5 to C-6) 2/4   biceps (C-5 to C-6) 2/4 tricep (C7)  2/4    triceps (C7) 2/4 Brachioradialis (C6) 2/4  Brachioradialis (C6) 2/4  Lower Extremity Lower Extremity  quadriceps (L-2 to L-4) 2/4   quadriceps (L-2 to L-4) 2/4 Achilles (S1) 0/4   Achilles (S1) 0/4  Plantars: Right: downgoing   Left: downgoing Cerebellar: normal finger-to-nose,  normal heel-to-shin test Gait: not tested.  CV: pulses palpable throughout    Lab Results: Basic Metabolic Panel:  Recent Labs Lab  09/05/13 1913  NA 140  K 4.4  CL 106  CO2 24  GLUCOSE 98  BUN 18  CREATININE 1.57*  CALCIUM 9.2    Liver Function Tests: No results found for this basename: AST, ALT, ALKPHOS, BILITOT, PROT, ALBUMIN,  in the last 168 hours No results found for this basename: LIPASE, AMYLASE,  in the last 168 hours No results found for this basename: AMMONIA,  in the last 168 hours  CBC:  Recent Labs Lab 09/05/13 1913  WBC 9.2  NEUTROABS 6.2  HGB 9.1*  HCT 28.3*  MCV 87.6  PLT 266    Cardiac Enzymes:  Recent Labs Lab 09/06/13 0035 09/06/13 0619 09/06/13 1155  TROPONINI <0.30 <0.30 <0.30    Lipid Panel: No results found for this basename: CHOL, TRIG, HDL, CHOLHDL, VLDL, LDLCALC,  in the last 168 hours  CBG:  Recent Labs Lab 09/06/13 1448 09/06/13 1700 09/06/13 2030 09/07/13 0725 09/07/13 1303  GLUCAP 208* 190* 148* 116* 195*    Microbiology: Results for orders placed during the hospital encounter of 09/05/13  URINE CULTURE     Status: None   Collection Time    09/05/13  7:55 PM      Result Value Ref Range Status   Specimen Description URINE, RANDOM   Final   Special Requests NONE   Final   Culture  Setup Time     Final   Value: 09/05/2013 23:24     Performed at [facility]     Final   Value: >=100,000 COLONIES/ML     Performed at [facility]   Culture     Final   Value: [place]     Performed at [facility]   Report Status PENDING   Incomplete  CULTURE, BLOOD (ROUTINE X 2)      Status: None   Collection Time    09/06/13  9:10 AM      Result Value Ref Range Status   Specimen Description BLOOD RIGHT ANTECUBITAL   Final   Special Requests BOTTLES DRAWN AEROBIC AND ANAEROBIC 5CC   Final   Culture  Setup Time     Final   Value: 09/06/2013 14:35     Performed at [facility]   Culture     Final   Value:        BLOOD CULTURE RECEIVED NO GROWTH TO DATE CULTURE WILL BE HELD FOR 5 DAYS BEFORE ISSUING A FINAL NEGATIVE REPORT     Performed at [facility]   Report Status PENDING   Incomplete  CULTURE, BLOOD (ROUTINE X 2)     Status: None   Collection Time    09/06/13  9:15 AM      Result Value Ref Range Status   Specimen Description BLOOD RIGHT HAND   Final   Special Requests BOTTLES DRAWN AEROBIC ONLY [place]   Final   Culture  Setup Time     Final   Value: 09/06/2013 14:35     Performed at [facility]   Culture     Final   Value:        BLOOD CULTURE RECEIVED NO GROWTH TO DATE CULTURE WILL BE HELD FOR 5 DAYS BEFORE ISSUING A FINAL NEGATIVE REPORT     Performed at [facility]   Report Status PENDING   Incomplete    Coagulation Studies: No results found for this basename: LABPROT, INR,  in the last 72 hours  Imaging: Dg Chest 2 View  09/05/2013   CLINICAL DATA:  Shortness of breath.  EXAM: CHEST  2 VIEW  COMPARISON:  08/17/2013.  FINDINGS: The heart is enlarged. Stable tortuosity and ectasia of the thoracic aorta. There is central venous congestion and moderate pulmonary edema. No definite pleural effusions. Low lung volumes with vascular crowding and bibasilar atelectasis. The bony thorax is intact.  IMPRESSION: CHF.   Electronically Signed   By: Kalman Jewels M.D.   On: 09/05/2013 22:46    Assessment and plan per attending neurologist  Etta Quill PA-C [facility] [phone]  09/07/2013, 1:47 PM   Assessment/Plan:  63 YO female with worsening hyperkinetic movements of face/UE and LE. Exam finding consistent  with tardive dyskinesia. Patient denies any antipsychotic medications in past.  Patient currently on 0.5 mg Klonopin at night with minimal benefit.    Recommend: 1) Klonopin 0.5 mg BID  2) Out patient follow up with neurology (movement disorder specialist)  I personally participate in this patient's evaluation and management, including formulating the above clinical impression and management recommendations.  Rush Farmer M.D. [facility] [phone]

## 2013-09-07 NOTE — Progress Notes (Signed)
Costella Hatcher, RN Notified - Wrong Time

## 2013-09-07 NOTE — Progress Notes (Signed)
[facility] PROGRESS NOTE  [name] ELF:[id number] DOB: 1950-04-19 DOA: 09/05/2013 PCP: Angelica Chessman, MD  Assessment/Plan: 1. Acutely Decompensated Chronic systolic heart failure - last EF measured during this month was 40-45%. Currently on Lasix 40 IV daily. Restrict fluids. Monitor daily weights intake output and metabolic panel. Currently on ARB. 1. Overnight, pt is down over 3L 2. Wt is near base dry weight of around 78kg 3. Pt remains O2 dependent. Cont diurese and wean O2 as tolerated 2. Involuntary movements - Discontinued patient's Neurontin and Lexapro for now. Continued Klonopin. 1. Pt reports involuntary movements seem worse this AM 2. Also reports blurry vision worse 3. Will formally consult Neurology for any further recs 3. UTI - Patient is on ceftriaxone. 1. >100,000 GNR on urine cx thus far 4. Anemia - patient's hemoglobin has further decreased. Patient has had recent anemia panel which showed ferritin of around 80.  1. - Cont to follow CBC. 5. Chronic kidney disease with mild worsening - since patient is on Lasix closely follow intake output and metabolic panel. 1. Pt has been intentionally markedly decreasing PO fluid intake while on lasix 2. Advised to stay hydrated to prevent ARF 6. Diabetes mellitus - holding metformin due to renal failure. Continue home medications otherwise. 7. Hypertension - Stable presently. Consider holding ARB if renal fx worsens  Code Status: Full Family Communication: Pt and sister in room (indicate person spoken with, relationship, and if by phone, the number) Disposition Plan: Pending   Consultants:  Neurology  Procedures:    Antibiotics:  Rocephin 8/17>>> (indicate start date, and stop date if known)  HPI/Subjective: Reports involuntary movements worsened overnight, occurs when sleeping as well  Objective: Filed Vitals:   09/06/13 1823 09/06/13 2000 09/07/13 0438 09/07/13 0952  BP: 117/69 132/66 106/43  103/62  Pulse: 102 96 80 91  Temp:  99.4 F (37.4 C) 98.2 F (36.8 C)   TempSrc:  Oral Oral   Resp:  20 18   Height:      Weight:   78.2 kg (172 lb 6.4 oz)   SpO2:  94% 96%     Intake/Output Summary (Last 24 hours) at 09/07/13 1234 Last data filed at 09/07/13 0658  Gross per 24 hour  Intake      0 ml  Output   1450 ml  Net  -1450 ml   Filed Weights   09/05/13 1713 09/06/13 0204 09/07/13 0438  Weight: 82.696 kg (182 lb 5 oz) 81.285 kg (179 lb 3.2 oz) 78.2 kg (172 lb 6.4 oz)    Exam:   General:  Awake, in nad  Cardiovascular: regular, s1, s2  Respiratory: normal resp effort, no wheezing  Abdomen: soft,nondistended  Musculoskeletal: perfused, no clubbing, involuntary B UE and BLE movements in room, sparing face   Data Reviewed: Basic Metabolic Panel:  Recent Labs Lab 09/05/13 1913  NA 140  K 4.4  CL 106  CO2 24  GLUCOSE 98  BUN 18  CREATININE 1.57*  CALCIUM 9.2   Liver Function Tests: No results found for this basename: AST, ALT, ALKPHOS, BILITOT, PROT, ALBUMIN,  in the last 168 hours No results found for this basename: LIPASE, AMYLASE,  in the last 168 hours No results found for this basename: AMMONIA,  in the last 168 hours CBC:  Recent Labs Lab 09/05/13 1913  WBC 9.2  NEUTROABS 6.2  HGB 9.1*  HCT 28.3*  MCV 87.6  PLT 266   Cardiac Enzymes:  Recent Labs Lab 09/06/13 0035 09/06/13  0109 09/06/13 1155  TROPONINI <0.30 <0.30 <0.30   BNP (last 3 results)  Recent Labs  09/06/13 0035  PROBNP 1129.0*   CBG:  Recent Labs Lab 09/06/13 1147 09/06/13 1448 09/06/13 1700 09/06/13 2030 09/07/13 0725  GLUCAP 172* 208* 190* 148* 116*    Recent Results (from the past 240 hour(s))  URINE CULTURE     Status: None   Collection Time    09/05/13  7:55 PM      Result Value Ref Range Status   Specimen Description URINE, RANDOM   Final   Special Requests NONE   Final   Culture  Setup Time     Final   Value: 09/05/2013 23:24     Performed  at [facility]     Final   Value: >=100,000 COLONIES/ML     Performed at [facility]   Culture     Final   Value: [place]     Performed at [facility]   Report Status PENDING   Incomplete  CULTURE, BLOOD (ROUTINE X 2)     Status: None   Collection Time    09/06/13  9:10 AM      Result Value Ref Range Status   Specimen Description BLOOD RIGHT ANTECUBITAL   Final   Special Requests BOTTLES DRAWN AEROBIC AND ANAEROBIC 5CC   Final   Culture  Setup Time     Final   Value: 09/06/2013 14:35     Performed at [facility]   Culture     Final   Value:        BLOOD CULTURE RECEIVED NO GROWTH TO DATE CULTURE WILL BE HELD FOR 5 DAYS BEFORE ISSUING A FINAL NEGATIVE REPORT     Performed at [facility]   Report Status PENDING   Incomplete  CULTURE, BLOOD (ROUTINE X 2)     Status: None   Collection Time    09/06/13  9:15 AM      Result Value Ref Range Status   Specimen Description BLOOD RIGHT HAND   Final   Special Requests BOTTLES DRAWN AEROBIC ONLY [place]   Final   Culture  Setup Time     Final   Value: 09/06/2013 14:35     Performed at [facility]   Culture     Final   Value:        BLOOD CULTURE RECEIVED NO GROWTH TO DATE CULTURE WILL BE HELD FOR 5 DAYS BEFORE ISSUING A FINAL NEGATIVE REPORT     Performed at [facility]   Report Status PENDING   Incomplete     Studies: Dg Chest 2 View  09/05/2013   CLINICAL DATA:  Shortness of breath.  EXAM: CHEST  2 VIEW  COMPARISON:  08/17/2013.  FINDINGS: The heart is enlarged. Stable tortuosity and ectasia of the thoracic aorta. There is central venous congestion and moderate pulmonary edema. No definite pleural effusions. Low lung volumes with vascular crowding and bibasilar atelectasis. The bony thorax is intact.  IMPRESSION: CHF.   Electronically Signed   By: Kalman Jewels M.D.   On: 09/05/2013 22:46    Scheduled Meds: . amLODipine  10 mg Oral Daily   And  .  irbesartan  300 mg Oral Daily  . aspirin EC  81 mg Oral Daily  . atorvastatin  10 mg Oral q1800  . carvedilol  6.25 mg Oral BID WC  . cefTRIAXone (ROCEPHIN)  IV  1 g Intravenous Daily  . clonazePAM  0.5 mg Oral QHS  . enoxaparin (LOVENOX) injection  40 mg Subcutaneous Q24H  . furosemide  40 mg Intravenous Daily  . insulin aspart  0-9 Units Subcutaneous TID WC  . insulin glargine  20 Units Subcutaneous BID  . sodium chloride  3 mL Intravenous Q12H  . sodium chloride  3 mL Intravenous Q12H   Continuous Infusions:   Principal Problem:   Systolic CHF, acute Active Problems:   HYPERTENSION   Diabetes mellitus type 2, uncontrolled   Involuntary movements   CKD (chronic kidney disease), stage III   Anemia   UTI (lower urinary tract infection)   CHF (congestive heart failure)  Time spent: 59min  [facility], [facility] Pager [phone]. If 7PM-7AM, please contact night-coverage at [email or web address], password [facility] 09/07/2013, 12:34 PM  LOS: 2 days

## 2013-09-07 NOTE — Progress Notes (Signed)
Upon entering room for shift report pt c/o that her vision has become increasingly blurry over the last day.  She denies and headache,double vision. She moves all extremities without difficulty.  Pt states he "sugar might be low" and when questioned if this happens at home when her sugar is low she stated "sometimes".  Cesar RN aware and will continue to monitor. Will check CBG and follow up accordingly. Jessie Foot, RN

## 2013-09-07 NOTE — Clinical Documentation Improvement (Signed)
  H&P and Note give both Acute Systolic CHF and Acute on Chronic Diastolic CHF. Please clarify in chart the diagnosis to avoid conflicting documentation. Thank you.  Thank You, Ezekiel Ina ,RN Clinical Documentation Specialist:  [phone]  [facility] [phone]

## 2013-09-08 ENCOUNTER — Other Ambulatory Visit: Payer: Self-pay | Admitting: Internal Medicine

## 2013-09-08 DIAGNOSIS — E1165 Type 2 diabetes mellitus with hyperglycemia: Secondary | ICD-10-CM

## 2013-09-08 DIAGNOSIS — E1110 Type 2 diabetes mellitus with ketoacidosis without coma: Secondary | ICD-10-CM

## 2013-09-08 DIAGNOSIS — E1169 Type 2 diabetes mellitus with other specified complication: Secondary | ICD-10-CM

## 2013-09-08 DIAGNOSIS — IMO0002 Reserved for concepts with insufficient information to code with codable children: Secondary | ICD-10-CM

## 2013-09-08 DIAGNOSIS — I10 Essential (primary) hypertension: Secondary | ICD-10-CM

## 2013-09-08 LAB — BASIC METABOLIC PANEL
Anion gap: 11 (ref 5–15)
BUN: 24 mg/dL — AB (ref 6–23)
CO2: 26 mEq/L (ref 19–32)
Calcium: 9.1 mg/dL (ref 8.4–10.5)
Chloride: 100 mEq/L (ref 96–112)
Creatinine, Ser: 1.94 mg/dL — ABNORMAL HIGH (ref 0.50–1.10)
GFR, EST AFRICAN AMERICAN: 30 mL/min — AB (ref >90)
GFR, EST NON AFRICAN AMERICAN: 26 mL/min — AB (ref >90)
GLUCOSE: 126 mg/dL — AB (ref 70–99)
Potassium: 3.9 mEq/L (ref 3.7–5.3)
Sodium: 137 mEq/L (ref 137–147)

## 2013-09-08 LAB — URINE CULTURE

## 2013-09-08 LAB — GLUCOSE, CAPILLARY
GLUCOSE-CAPILLARY: 82 mg/dL (ref 70–99)
Glucose-Capillary: 131 mg/dL — ABNORMAL HIGH (ref 70–99)
Glucose-Capillary: 91 mg/dL (ref 70–99)
Glucose-Capillary: 68 mg/dL — ABNORMAL LOW (ref 70–99)
Glucose-Capillary: 112 mg/dL — ABNORMAL HIGH (ref 70–99)

## 2013-09-08 MED ORDER — LEVOFLOXACIN 750 MG PO TABS
750.0000 mg | ORAL_TABLET | ORAL | Status: DC
  Administered 2013-09-08 – 2013-09-10 (×2): 750 mg via ORAL
  Filled 2013-09-08 (×2): qty 1

## 2013-09-08 MED ORDER — SODIUM CHLORIDE 0.9 % IV SOLN
INTRAVENOUS | Status: AC
  Administered 2013-09-08: 08:00:00 via INTRAVENOUS

## 2013-09-08 NOTE — Progress Notes (Signed)
Subjective: Patient continues to have hyperkinetic movements of face/UE and LE. Initially she was refusing the Klonopin this AM but after discussion about how the Klonopin helps at night she has decided to take the Klonopin this AM.   Objective: Current vital signs: BP 100/70  Pulse 95  Temp(Src) 98.9 F (37.2 C) (Oral)  Resp 18  Ht 5\' 6"  (1.676 m)  Wt 76.613 kg (168 lb 14.4 oz)  BMI 27.27 kg/m2  SpO2 100% Vital signs in last 24 hours: Temp:  [98.9 F (37.2 C)-100.1 F (37.8 C)] 98.9 F (37.2 C) (08/20 0500) Pulse Rate:  [91-96] 95 (08/20 0500) BP: (100-126)/(62-70) 100/70 mmHg (08/20 0500) SpO2:  [97 %-100 %] 100 % (08/20 0500) Weight:  [76.613 kg (168 lb 14.4 oz)] 76.613 kg (168 lb 14.4 oz) (08/20 0500)  Intake/Output from previous day: 08/19 0701 - 08/20 0700 In: 39 [P.O.:580] Out: 700 [Urine:700] Intake/Output this shift:   Nutritional status:    Neurologic Exam: General: NAD  Mental Status:  Alert, oriented, thought content appropriate. Speech fluent without evidence of aphasia. Able to follow 3 step commands without difficulty.  Cranial Nerves:  II: Discs flat bilaterally; Visual fields grossly normal, pupils equal, round, reactive to light and accommodation  III,IV, VI: ptosis not present, extra-ocular motions intact bilaterally  V,VII: smile symmetric with intermittent facial twisting, retracting corners of her mouth, facial light touch sensation normal bilaterally  VIII: hearing normal bilaterally  IX,X: gag reflex present  XI: bilateral shoulder shrug  XII: midline tongue extension, twisting of tongue.  Motor:  Right :  Upper extremity 5/5  Left:  Upper extremity 5/5   Lower extremity 5/5   Lower extremity 5/5  --Intermittent flailing and twisting of her arms left arm greater than right  --Right leg shows intermittent quad extension which is decreased when asked to take part in movement but not fully abated.  Tone and bulk:normal tone throughout; no atrophy  noted  Sensory: decreased vibratory and temperature sensation in lower extremities.  Deep Tendon Reflexes:  Right: Upper Extremity   Left:  Upper extremity   biceps (C-5 to C-6) 2/4   biceps (C-5 to C-6) 2/4   tricep (C7) 2/4    triceps (C7) 2/4   Brachioradialis (C6) 2/4   Brachioradialis (C6) 2/4   Lower Extremity    Lower Extremity   quadriceps (L-2 to L-4) 2/4   quadriceps (L-2 to L-4) 2/4   Achilles (S1) 0/4    Achilles (S1) 0/4  Plantars:  Right: downgoing   Left: downgoing  Cerebellar:  normal finger-to-nose, normal heel-to-shin test  Gait: not tested.  CV: pulses palpable throughout    Lab Results: Basic Metabolic Panel:  Recent Labs Lab 09/05/13 1913 09/08/13 0508  NA 140 137  K 4.4 3.9  CL 106 100  CO2 24 26  GLUCOSE 98 126*  BUN 18 24*  CREATININE 1.57* 1.94*  CALCIUM 9.2 9.1    Liver Function Tests: No results found for this basename: AST, ALT, ALKPHOS, BILITOT, PROT, ALBUMIN,  in the last 168 hours No results found for this basename: LIPASE, AMYLASE,  in the last 168 hours No results found for this basename: AMMONIA,  in the last 168 hours  CBC:  Recent Labs Lab 09/05/13 1913  WBC 9.2  NEUTROABS 6.2  HGB 9.1*  HCT 28.3*  MCV 87.6  PLT 266    Cardiac Enzymes:  Recent Labs Lab 09/06/13 0035 09/06/13 0619 09/06/13 1155  TROPONINI <0.30 <0.30 <0.30  Lipid Panel: No results found for this basename: CHOL, TRIG, HDL, CHOLHDL, VLDL, LDLCALC,  in the last 168 hours  CBG:  Recent Labs Lab 09/07/13 1431 09/07/13 1641 09/07/13 1724 09/07/13 2109 09/08/13 0722  GLUCAP 135* 57* 107* 112* 131*    Microbiology: Results for orders placed during the hospital encounter of 09/05/13  URINE CULTURE     Status: None   Collection Time    09/05/13  7:55 PM      Result Value Ref Range Status   Specimen Description URINE, RANDOM   Final   Special Requests NONE   Final   Culture  Setup Time     Final   Value: 09/05/2013 23:24     Performed at  [place]     Final   Value: >=100,000 COLONIES/ML     Performed at [facility]   Culture     Final   Value: KLEBSIELLA PNEUMONIAE     Performed at [facility]   Report Status 09/08/2013 FINAL   Final   Organism ID, Bacteria KLEBSIELLA PNEUMONIAE   Final  CULTURE, BLOOD (ROUTINE X 2)     Status: None   Collection Time    09/06/13  9:10 AM      Result Value Ref Range Status   Specimen Description BLOOD RIGHT ANTECUBITAL   Final   Special Requests BOTTLES DRAWN AEROBIC AND ANAEROBIC 5CC   Final   Culture  Setup Time     Final   Value: 09/06/2013 14:35     Performed at [facility]   Culture     Final   Value:        BLOOD CULTURE RECEIVED NO GROWTH TO DATE CULTURE WILL BE HELD FOR 5 DAYS BEFORE ISSUING A FINAL NEGATIVE REPORT     Performed at [facility]   Report Status PENDING   Incomplete  CULTURE, BLOOD (ROUTINE X 2)     Status: None   Collection Time    09/06/13  9:15 AM      Result Value Ref Range Status   Specimen Description BLOOD RIGHT HAND   Final   Special Requests BOTTLES DRAWN AEROBIC ONLY [place]   Final   Culture  Setup Time     Final   Value: 09/06/2013 14:35     Performed at [facility]   Culture     Final   Value:        BLOOD CULTURE RECEIVED NO GROWTH TO DATE CULTURE WILL BE HELD FOR 5 DAYS BEFORE ISSUING A FINAL NEGATIVE REPORT     Performed at [facility]   Report Status PENDING   Incomplete    Coagulation Studies: No results found for this basename: LABPROT, INR,  in the last 72 hours  Imaging: No results found.  Medications:  Scheduled: . amLODipine  10 mg Oral Daily  . aspirin EC  81 mg Oral Daily  . atorvastatin  10 mg Oral q1800  . carvedilol  6.25 mg Oral BID WC  . cefTRIAXone (ROCEPHIN)  IV  1 g Intravenous Daily  . clonazePAM  0.5 mg Oral 2 times per day  . enoxaparin (LOVENOX) injection  40 mg Subcutaneous Q24H  . insulin aspart  0-9 Units Subcutaneous TID WC  .  insulin glargine  20 Units Subcutaneous BID  . sodium chloride  3 mL Intravenous Q12H  . sodium chloride  3 mL Intravenous Q12H    Assessment/Plan:  63 YO female with worsening hyperkinetic movements of face/UE and LE. Exam finding consistent with tardive dyskinesia. Patient denies any antipsychotic medications in past. Patient currently on 0.5 mg Klonopin at night and today states it is of benefit.    Recommend:  1) Klonopin 0.5 mg BID  2) Out patient follow up with neurology (movement disorder specialist)    No further neurologic intervention is recommended at this time.  If further questions arise, please call or page at that time.  Thank you for allowing neurology to participate in the care of this patient.  Etta Quill PA-C [facility] [phone]  09/08/2013, 9:55 AM

## 2013-09-08 NOTE — Progress Notes (Signed)
[facility]  Patient Status: Active (receiving services up to time of hospitalization)  [facility] is providing the following services: PT  If patient discharges after hours, please call [phone].   Janae Sauce 09/08/2013, 10:38 AM

## 2013-09-08 NOTE — Progress Notes (Signed)
[facility] PROGRESS NOTE  [name] PIR:[id number] DOB: 1950/12/16 DOA: 09/05/2013 PCP: Angelica Chessman, MD  Assessment/Plan: 1. Acutely Decompensated Chronic systolic heart failure - last EF measured during this month was 40-45%.. 1. Thus far, pt is down over 35L 2. Wt is 76kg, dry weight of around 78kg 3. Pt remains O2 dependent. Wean O2 as tolerated 4. Lasix now on hold and pt on gentle IVF as renal fx has worsened (see below) 5. ARB on hold 2. Involuntary movements - Discontinued patient's Neurontin and Lexapro for now. Continued Klonopin. 1. Pt reports involuntary movements slightly better 2. Also reports blurry vision worse 3. Appreciate Neuro input 4. Pt had refused AM dose of clonazepam, but now agreeable 3. UTI - Patient was initially on ceftriaxone. 1. >100,000 pan-sensitive Klebsiella on urine cx 2. Will transition pt to levaquin based on urine cx 4. Anemia - patient's hemoglobin has further decreased. Patient has had recent anemia panel which showed ferritin of around 80.  1. - Cont to follow CBC. 5. Chronic kidney disease with mild worsening 1. Pt had been intentionally decreasing PO fluid intake while on lasix 2. Cr now up to 1.9 from 1.5 on admit 3. Gentle IVF for now and hold ARB 6. Diabetes mellitus - holding metformin due to renal failure. Continue home medications otherwise. 7. Hypertension - Stable presently. Now holding ARB per above  Code Status: Full Family Communication: Pt in room Disposition Plan: Pending   Consultants:  Neurology  Procedures:    Antibiotics:  Rocephin 8/17>>> (indicate start date, and stop date if known)  HPI/Subjective: Reports involuntary movements slightly improved.  Objective: Filed Vitals:   09/07/13 2130 09/08/13 0500 09/08/13 0946 09/08/13 1425  BP: 112/63 100/70 105/67 92/57  Pulse: 95 95 89 94  Temp: 100.1 F (37.8 C) 98.9 F (37.2 C)  99.1 F (37.3 C)  TempSrc: Oral Oral  Oral  Resp:    17   Height:      Weight:  76.613 kg (168 lb 14.4 oz)    SpO2: 97% 100%  100%    Intake/Output Summary (Last 24 hours) at 09/08/13 1528 Last data filed at 09/08/13 0238  Gross per 24 hour  Intake    240 ml  Output    700 ml  Net   -460 ml   Filed Weights   09/06/13 0204 09/07/13 0438 09/08/13 0500  Weight: 81.285 kg (179 lb 3.2 oz) 78.2 kg (172 lb 6.4 oz) 76.613 kg (168 lb 14.4 oz)    Exam:   General:  Awake, in nad  Cardiovascular: regular, s1, s2  Respiratory: normal resp effort, no wheezing  Abdomen: soft,nondistended  Musculoskeletal: perfused, no clubbing, involuntary B UE and BLE movements in room, sparing face   Data Reviewed: Basic Metabolic Panel:  Recent Labs Lab 09/05/13 1913 09/08/13 0508  NA 140 137  K 4.4 3.9  CL 106 100  CO2 24 26  GLUCOSE 98 126*  BUN 18 24*  CREATININE 1.57* 1.94*  CALCIUM 9.2 9.1   Liver Function Tests: No results found for this basename: AST, ALT, ALKPHOS, BILITOT, PROT, ALBUMIN,  in the last 168 hours No results found for this basename: LIPASE, AMYLASE,  in the last 168 hours No results found for this basename: AMMONIA,  in the last 168 hours CBC:  Recent Labs Lab 09/05/13 1913  WBC 9.2  NEUTROABS 6.2  HGB 9.1*  HCT 28.3*  MCV 87.6  PLT 266   Cardiac Enzymes:  Recent Labs Lab 09/06/13  8502 09/06/13 0619 09/06/13 1155  TROPONINI <0.30 <0.30 <0.30   BNP (last 3 results)  Recent Labs  09/06/13 0035  PROBNP 1129.0*   CBG:  Recent Labs Lab 09/07/13 1641 09/07/13 1724 09/07/13 2109 09/08/13 0722 09/08/13 1139  GLUCAP 57* 107* 112* 131* 91    Recent Results (from the past 240 hour(s))  URINE CULTURE     Status: None   Collection Time    09/05/13  7:55 PM      Result Value Ref Range Status   Specimen Description URINE, RANDOM   Final   Special Requests NONE   Final   Culture  Setup Time     Final   Value: 09/05/2013 23:24     Performed at [place]     Final    Value: >=100,000 COLONIES/ML     Performed at [facility]   Culture     Final   Value: KLEBSIELLA PNEUMONIAE     Performed at [facility]   Report Status 09/08/2013 FINAL   Final   Organism ID, Bacteria KLEBSIELLA PNEUMONIAE   Final  CULTURE, BLOOD (ROUTINE X 2)     Status: None   Collection Time    09/06/13  9:10 AM      Result Value Ref Range Status   Specimen Description BLOOD RIGHT ANTECUBITAL   Final   Special Requests BOTTLES DRAWN AEROBIC AND ANAEROBIC 5CC   Final   Culture  Setup Time     Final   Value: 09/06/2013 14:35     Performed at [facility]   Culture     Final   Value:        BLOOD CULTURE RECEIVED NO GROWTH TO DATE CULTURE WILL BE HELD FOR 5 DAYS BEFORE ISSUING A FINAL NEGATIVE REPORT     Performed at [facility]   Report Status PENDING   Incomplete  CULTURE, BLOOD (ROUTINE X 2)     Status: None   Collection Time    09/06/13  9:15 AM      Result Value Ref Range Status   Specimen Description BLOOD RIGHT HAND   Final   Special Requests BOTTLES DRAWN AEROBIC ONLY [place]   Final   Culture  Setup Time     Final   Value: 09/06/2013 14:35     Performed at [facility]   Culture     Final   Value:        BLOOD CULTURE RECEIVED NO GROWTH TO DATE CULTURE WILL BE HELD FOR 5 DAYS BEFORE ISSUING A FINAL NEGATIVE REPORT     Performed at [facility]   Report Status PENDING   Incomplete     Studies: No results found.  Scheduled Meds: . amLODipine  10 mg Oral Daily  . aspirin EC  81 mg Oral Daily  . atorvastatin  10 mg Oral q1800  . carvedilol  6.25 mg Oral BID WC  . clonazePAM  0.5 mg Oral 2 times per day  . enoxaparin (LOVENOX) injection  40 mg Subcutaneous Q24H  . insulin aspart  0-9 Units Subcutaneous TID WC  . insulin glargine  20 Units Subcutaneous BID  . levofloxacin  750 mg Oral Q48H  . sodium chloride  3 mL Intravenous Q12H  . sodium chloride  3 mL Intravenous Q12H   Continuous Infusions: . sodium chloride  50 mL/hr at 09/08/13 7741    Principal Problem:   Systolic CHF, acute  Active Problems:   HYPERTENSION   Diabetes mellitus type 2, uncontrolled   Involuntary movements   CKD (chronic kidney disease), stage III   Anemia   UTI (lower urinary tract infection)   CHF (congestive heart failure)  Time spent: 29min  [facility], [facility] [facility] Pager [phone]. If 7PM-7AM, please contact night-coverage at [email or web address], password [facility] 09/08/2013, 3:28 PM  LOS: 3 days

## 2013-09-08 NOTE — Progress Notes (Signed)
ANTIBIOTIC CONSULT NOTE - INITIAL  Pharmacy Consult for levofloxacin Indication: UTI  Allergies  Allergen Reactions  . Codeine Nausea And Vomiting  . Grapefruit Concentrate Itching  . Morphine And Related Other (See Comments)    Extreme sedation  . Peanut-Containing Drug Products Nausea And Vomiting    Patient Measurements: Height: 5\' 6"  (167.6 cm) Weight: 168 lb 14.4 oz (76.613 kg) IBW/kg (Calculated) : 59.3   Vital Signs: Temp: 98.9 F (37.2 C) (08/20 0500) Temp src: Oral (08/20 0500) BP: 105/67 mmHg (08/20 0946) Pulse Rate: 89 (08/20 0946) Intake/Output from previous day: 08/19 0701 - 08/20 0700 In: 580 [P.O.:580] Out: 700 [Urine:700] Intake/Output from this shift:    Labs:  Recent Labs  09/05/13 1913 09/08/13 0508  WBC 9.2  --   HGB 9.1*  --   PLT 266  --   CREATININE 1.57* 1.94*   Estimated Creatinine Clearance: 30.6 ml/min (by C-G formula based on Cr of 1.94). No results found for this basename: VANCOTROUGH, Corlis Leak, VANCORANDOM, GENTTROUGH, GENTPEAK, GENTRANDOM, TOBRATROUGH, TOBRAPEAK, TOBRARND, AMIKACINPEAK, AMIKACINTROU, AMIKACIN,  in the last 72 hours   Microbiology: Recent Results (from the past 720 hour(s))  MRSA PCR SCREENING     Status: None   Collection Time    08/17/13  7:30 PM      Result Value Ref Range Status   MRSA by PCR NEGATIVE  NEGATIVE Final   Comment:            The GeneXpert MRSA Assay (FDA     approved for NASAL specimens     only), is one component of a     comprehensive MRSA colonization     surveillance program. It is not     intended to diagnose MRSA     infection nor to guide or     monitor treatment for     MRSA infections.  URINE CULTURE     Status: None   Collection Time    09/05/13  7:55 PM      Result Value Ref Range Status   Specimen Description URINE, RANDOM   Final   Special Requests NONE   Final   Culture  Setup Time     Final   Value: 09/05/2013 23:24     Performed at [place]     Final   Value: >=100,000 COLONIES/ML     Performed at [facility]   Culture     Final   Value: KLEBSIELLA PNEUMONIAE     Performed at [facility]   Report Status 09/08/2013 FINAL   Final   Organism ID, Bacteria KLEBSIELLA PNEUMONIAE   Final  CULTURE, BLOOD (ROUTINE X 2)     Status: None   Collection Time    09/06/13  9:10 AM      Result Value Ref Range Status   Specimen Description BLOOD RIGHT ANTECUBITAL   Final   Special Requests BOTTLES DRAWN AEROBIC AND ANAEROBIC 5CC   Final   Culture  Setup Time     Final   Value: 09/06/2013 14:35     Performed at [facility]   Culture     Final   Value:        BLOOD CULTURE RECEIVED NO GROWTH TO DATE CULTURE WILL BE HELD FOR 5 DAYS BEFORE ISSUING A FINAL NEGATIVE REPORT     Performed at [facility]   Report Status PENDING   Incomplete  CULTURE, BLOOD (ROUTINE X 2)  Status: None   Collection Time    09/06/13  9:15 AM      Result Value Ref Range Status   Specimen Description BLOOD RIGHT HAND   Final   Special Requests BOTTLES DRAWN AEROBIC ONLY [facility]   Final   Culture  Setup Time     Final   Value: 09/06/2013 14:35     Performed at [facility]   Culture     Final   Value:        BLOOD CULTURE RECEIVED NO GROWTH TO DATE CULTURE WILL BE HELD FOR 5 DAYS BEFORE ISSUING A FINAL NEGATIVE REPORT     Performed at [facility]   Report Status PENDING   Incomplete    Assessment: 46 YOF with pan sensitive klebsielle pneumonia UTI being transitioned to levofloxacin from ceftriaxone. She received 2 doses of ceftriaxone. SCr 1.9 with est CrCl ~43mL/min. WBC nml, Tmax/24h 100.1.  Goal of Therapy:  Eradication of infection  Plan:  1. Levofloxacin 750mg  PO q48h 2. Follow clinical progression, fever curve, renal function and LOT  Sanda Dejoy D. Jaydence Vanyo, PharmD, BCPS Clinical Pharmacist Pager: [phone] 09/08/2013 10:11 AM

## 2013-09-08 NOTE — Progress Notes (Signed)
Hypoglycemic Event  CBG: 68  Treatment: 15 GM carbohydrate snack  Symptoms: None  Follow-up CBG: Time:1803 CBG Result:112  Possible Reasons for Event: Unknown  Comments/MD notified:    Lenna Sciara  Remember to initiate Hypoglycemia Order Set & complete

## 2013-09-09 LAB — GLUCOSE, CAPILLARY
GLUCOSE-CAPILLARY: 95 mg/dL (ref 70–99)
GLUCOSE-CAPILLARY: 107 mg/dL — AB (ref 70–99)
Glucose-Capillary: 59 mg/dL — ABNORMAL LOW (ref 70–99)
Glucose-Capillary: 51 mg/dL — ABNORMAL LOW (ref 70–99)
Glucose-Capillary: 61 mg/dL — ABNORMAL LOW (ref 70–99)
Glucose-Capillary: 154 mg/dL — ABNORMAL HIGH (ref 70–99)
Glucose-Capillary: 84 mg/dL (ref 70–99)

## 2013-09-09 LAB — BASIC METABOLIC PANEL
Anion gap: 10 (ref 5–15)
BUN: 23 mg/dL (ref 6–23)
CO2: 27 meq/L (ref 19–32)
Calcium: 9.3 mg/dL (ref 8.4–10.5)
Chloride: 105 mEq/L (ref 96–112)
Creatinine, Ser: 1.92 mg/dL — ABNORMAL HIGH (ref 0.50–1.10)
GFR calc Af Amer: 31 mL/min — ABNORMAL LOW (ref >90)
GFR calc non Af Amer: 26 mL/min — ABNORMAL LOW (ref >90)
GLUCOSE: 86 mg/dL (ref 70–99)
POTASSIUM: 4 meq/L (ref 3.7–5.3)
Sodium: 142 mEq/L (ref 137–147)

## 2013-09-09 MED ORDER — GLUCOSE-VITAMIN C 4-6 GM-MG PO CHEW
CHEWABLE_TABLET | ORAL | Status: AC
  Filled 2013-09-09: qty 3

## 2013-09-09 MED ORDER — INSULIN GLARGINE 100 UNIT/ML ~~LOC~~ SOLN
10.0000 [IU] | Freq: Two times a day (BID) | SUBCUTANEOUS | Status: DC
  Administered 2013-09-10 – 2013-09-13 (×6): 10 [IU] via SUBCUTANEOUS
  Filled 2013-09-09 (×9): qty 0.1

## 2013-09-09 MED ORDER — GLUCOSE-VITAMIN C 4-6 GM-MG PO CHEW: 4.0000 | CHEWABLE_TABLET | ORAL | Status: DC | PRN

## 2013-09-09 MED ORDER — SODIUM CHLORIDE 0.9 % IV SOLN
INTRAVENOUS | Status: AC
  Administered 2013-09-09: 11:00:00 via INTRAVENOUS

## 2013-09-09 MED ORDER — INSULIN GLARGINE 100 UNIT/ML ~~LOC~~ SOLN
15.0000 [IU] | Freq: Two times a day (BID) | SUBCUTANEOUS | Status: DC
  Administered 2013-09-09: 15 [IU] via SUBCUTANEOUS
  Filled 2013-09-09 (×2): qty 0.15

## 2013-09-09 MED ORDER — NAPHAZOLINE HCL 0.1 % OP SOLN
2.0000 [drp] | Freq: Four times a day (QID) | OPHTHALMIC | Status: DC | PRN
  Administered 2013-09-09: 2 [drp] via OPHTHALMIC
  Filled 2013-09-09: qty 15

## 2013-09-09 NOTE — Progress Notes (Addendum)
Pt's cbg at bedtime was 84. New orders per Md Woods to hold night time dose of Lantus 10 units. Will continue to monitor the pt.Hoover Brunette, RN

## 2013-09-09 NOTE — Progress Notes (Signed)
Hypoglycemic Event  CBG: 59 Treatment: 15 GM carbohydrate snack  Symptoms: None  Follow-up CBG: Time: 0828 CBG Result:95  Possible Reasons for Event: unknown  Comments/MD notified:Dr. Wyline Copas notified. No new orders received.     Diona Browner C  Remember to initiate Hypoglycemia Order Set & complete

## 2013-09-09 NOTE — Progress Notes (Signed)
[facility]  Patient Status: Active (receiving services up to time of hospitalization)  [facility] is providing the following services: PT  If patient discharges after hours, please call [phone].   [name] 09/09/2013, 10:00 AM

## 2013-09-09 NOTE — Progress Notes (Addendum)
Inpatient Diabetes Program Recommendations  AACE/ADA: New Consensus Statement on Inpatient Glycemic Control (2013)  Target Ranges:  Prepandial:   less than 140 mg/dL      Peak postprandial:   less than 180 mg/dL (1-2 hours)      Critically ill patients:  140 - 180 mg/dL     Results for [name], [name] (MRN [id number]) as of 09/09/2013 07:07  Ref. Range 09/07/2013 07:25 09/07/2013 13:03 09/07/2013 14:31 09/07/2013 16:41 09/07/2013 17:24 09/07/2013 21:09  Glucose-Capillary Latest Range: 70-99 mg/dL 116 (H) 195 (H) 135 (H) 57 (L) 107 (H) 112 (H)    Results for [name], [name] (MRN [id number]) as of 09/09/2013 07:07  Ref. Range 09/08/2013 07:22 09/08/2013 11:39 09/08/2013 16:56 09/08/2013 18:03 09/08/2013 20:51  Glucose-Capillary Latest Range: 70-99 mg/dL 131 (H) 91 68 (L) 112 (H) 82     **Patient having occasional mild hypoglycemia in the afternoon for the past two days.    MD- Please consider reducing patient's Lantus to 18 units bid (current order is for Lantus 20 units bid)    Addendum 1255pm: Spoke with Hassan Rowan from Care management.  Hassan Rowan relayed to me that patient was given a Lantus Rx savings card during her last admission at the end of July.  Per the notes, one of the DM Coordinators assisted Ms. Schwarz in activating the savings card so that the patient could get her Lantus insulin for $25 at each Rx fill for one year.  Per Hassan Rowan, patient stated she tried to use the savings card at [facility], however, Suzie Portela would not accept the savings card and the Lantus insulin was going to cost her $200.    Called patient to investigate.  Patient confirmed what Hassan Rowan told me about [facility] not accepting the savings card.  I asked and encouraged patient to call the 1-800# on the Lantus savings card and tell them about her issue with the savings card.  I am not sure if [facility] accepts the savings card or not.  Asked patient to call the insulin company and inquire about the card further.  If patient is  not able to use the Lantus savings card, may want to switch patient to a more affordable insulin.  Patient can purchase 70/30 insulin at [facility] with a physician's Rx (without her insurance) for $25 per vial.  Could convert to 70/30 insulin- 22 units bid with meals (breakfast and supper)    Will follow Wyn Quaker RN, MSN, CDE Diabetes Coordinator Inpatient Diabetes Program Team Pager: [phone] (8a-10p)

## 2013-09-09 NOTE — Progress Notes (Signed)
[facility] PROGRESS NOTE  [name] YDX:[id number] DOB: 1950-12-31 DOA: 09/05/2013 PCP: Angelica Chessman, MD  Assessment/Plan: 1. Acutely Decompensated Chronic systolic heart failure - last EF measured during this month was 40-45%.. 1. Thus far, pt is down over 35L 2. Wt is 76kg, dry weight of around 78kg 3. Pt now off o2 4. Lasix now on hold and pt on gentle IVF as renal fx has worsened (see below) 5. ARB on hold 2. Involuntary movements - Discontinued patient's Neurontin and Lexapro for now. Continued Klonopin. 1. Pt reports involuntary movements slightly better 2. Appreciated Neuro input 3. Recs for clonazepam and outpt f/u with movement clinic 4. Discussed with pt's sister at bedside. Pt has been undergoing much stress in her life over the past several years that had "messed her up" and that pt had noted improvement with psychiatry in the past 5. Consider Psych consult 3. UTI - Patient was initially on ceftriaxone. 1. >100,000 pan-sensitive Klebsiella on urine cx 2. Now on levaquin based on urine cx 4. Chronic Anemia - patient's hemoglobin has further decreased. Patient has had recent anemia panel which showed ferritin of around 80.  1. - Cont to follow CBC. 5. Chronic kidney disease with mild worsening 1. Pt had recently been intentionally decreasing PO fluid intake while on lasix 2. Cr remains at 1.9 from 1.5 on admit despite 50cc/hr x 12hrs NS 3. Cont with gentle IVF for now and hold ARB 6. Diabetes mellitus - holding metformin due to renal failure. Continue home medications otherwise. 7. Hypertension - Stable presently. Now holding ARB per above  Code Status: Full Family Communication: Pt in room Disposition Plan: Pending  Consultants:  Neurology  Procedures:    Antibiotics:  Rocephin 8/17>>> (indicate start date, and stop date if known)  Levaquin 8/20>>>  HPI/Subjective: Still complains of involuntary movement, family reports movements are  absent when pt is asleep and is overall better.  Objective: Filed Vitals:   09/08/13 1622 09/08/13 2100 09/09/13 0500 09/09/13 1006  BP: 120/61 108/67 100/73 111/85  Pulse:  103 100 96  Temp:  100.7 F (38.2 C) 98.2 F (36.8 C)   TempSrc:  Oral Oral   Resp:  18 18   Height:      Weight:   76.3 kg (168 lb 3.4 oz)   SpO2:  98% 98%     Intake/Output Summary (Last 24 hours) at 09/09/13 1206 Last data filed at 09/09/13 0900  Gross per 24 hour  Intake   1080 ml  Output   1926 ml  Net   -846 ml   Filed Weights   09/07/13 0438 09/08/13 0500 09/09/13 0500  Weight: 78.2 kg (172 lb 6.4 oz) 76.613 kg (168 lb 14.4 oz) 76.3 kg (168 lb 3.4 oz)    Exam:   General:  Awake, in nad  Cardiovascular: regular, s1, s2  Respiratory: normal resp effort, no wheezing  Abdomen: soft,nondistended  Musculoskeletal: perfused, no clubbing, involuntary B UE and BLE movements in room, sparing face   Data Reviewed: Basic Metabolic Panel:  Recent Labs Lab 09/05/13 1913 09/08/13 0508 09/09/13 0417  NA 140 137 142  K 4.4 3.9 4.0  CL 106 100 105  CO2 24 26 27   GLUCOSE 98 126* 86  BUN 18 24* 23  CREATININE 1.57* 1.94* 1.92*  CALCIUM 9.2 9.1 9.3   Liver Function Tests: No results found for this basename: AST, ALT, ALKPHOS, BILITOT, PROT, ALBUMIN,  in the last 168 hours No results found for this  basename: LIPASE, AMYLASE,  in the last 168 hours No results found for this basename: AMMONIA,  in the last 168 hours CBC:  Recent Labs Lab 09/05/13 1913  WBC 9.2  NEUTROABS 6.2  HGB 9.1*  HCT 28.3*  MCV 87.6  PLT 266   Cardiac Enzymes:  Recent Labs Lab 09/06/13 0035 09/06/13 0619 09/06/13 1155  TROPONINI <0.30 <0.30 <0.30   BNP (last 3 results)  Recent Labs  09/06/13 0035  PROBNP 1129.0*   CBG:  Recent Labs Lab 09/08/13 1803 09/08/13 2051 09/09/13 0747 09/09/13 0828 09/09/13 1149  GLUCAP 112* 82 59* 95 107*    Recent Results (from the past 240 hour(s))  URINE  CULTURE     Status: None   Collection Time    09/05/13  7:55 PM      Result Value Ref Range Status   Specimen Description URINE, RANDOM   Final   Special Requests NONE   Final   Culture  Setup Time     Final   Value: 09/05/2013 23:24     Performed at [facility]     Final   Value: >=100,000 COLONIES/ML     Performed at [facility]   Culture     Final   Value: KLEBSIELLA PNEUMONIAE     Performed at [facility]   Report Status 09/08/2013 FINAL   Final   Organism ID, Bacteria KLEBSIELLA PNEUMONIAE   Final  CULTURE, BLOOD (ROUTINE X 2)     Status: None   Collection Time    09/06/13  9:10 AM      Result Value Ref Range Status   Specimen Description BLOOD RIGHT ANTECUBITAL   Final   Special Requests BOTTLES DRAWN AEROBIC AND ANAEROBIC 5CC   Final   Culture  Setup Time     Final   Value: 09/06/2013 14:35     Performed at [facility]   Culture     Final   Value:        BLOOD CULTURE RECEIVED NO GROWTH TO DATE CULTURE WILL BE HELD FOR 5 DAYS BEFORE ISSUING A FINAL NEGATIVE REPORT     Performed at [facility]   Report Status PENDING   Incomplete  CULTURE, BLOOD (ROUTINE X 2)     Status: None   Collection Time    09/06/13  9:15 AM      Result Value Ref Range Status   Specimen Description BLOOD RIGHT HAND   Final   Special Requests BOTTLES DRAWN AEROBIC ONLY [place]   Final   Culture  Setup Time     Final   Value: 09/06/2013 14:35     Performed at [facility]   Culture     Final   Value:        BLOOD CULTURE RECEIVED NO GROWTH TO DATE CULTURE WILL BE HELD FOR 5 DAYS BEFORE ISSUING A FINAL NEGATIVE REPORT     Performed at [facility]   Report Status PENDING   Incomplete     Studies: No results found.  Scheduled Meds: . aspirin EC  81 mg Oral Daily  . atorvastatin  10 mg Oral q1800  . carvedilol  6.25 mg Oral BID WC  . clonazePAM  0.5 mg Oral 2 times per day  . enoxaparin (LOVENOX) injection  40 mg  Subcutaneous Q24H  . insulin aspart  0-9 Units Subcutaneous TID WC  . insulin glargine  15 Units Subcutaneous BID  .  levofloxacin  750 mg Oral Q48H  . sodium chloride  3 mL Intravenous Q12H  . sodium chloride  3 mL Intravenous Q12H   Continuous Infusions: . sodium chloride 75 mL/hr at 09/09/13 1056    Principal Problem:   Systolic CHF, acute Active Problems:   HYPERTENSION   Diabetes mellitus type 2, uncontrolled   Involuntary movements   CKD (chronic kidney disease), stage III   Anemia   UTI (lower urinary tract infection)   CHF (congestive heart failure)  Time spent: 93min  Frenchie Dangerfield, [facility] Pager [phone]. If 7PM-7AM, please contact night-coverage at [email or web address], password [email or web address] 09/09/2013, 12:06 PM  LOS: 4 days

## 2013-09-09 NOTE — Progress Notes (Signed)
Hypoglycemic Event  CBG: 51  Treatment: 15 GM carbohydrate snack  Symptoms: None  Follow-up CBG: Time:1724 CBG Result:61  Possible Reasons for Event: Medication regimen: lantus decreased per MD  Comments/MD notified:Dr. Anne Fu, Helane Gunther  Remember to initiate Hypoglycemia Order Set & complete

## 2013-09-09 NOTE — Progress Notes (Signed)
Hypoglycemic Event  CBG: 61  Treatment: 15 GM carbohydrate snack  Symptoms: None  Follow-up CBG: Time:1808 CBG Result:154  Possible Reasons for Event: Medication Regimen Comments/MD notified:Dr. Sydell Axon C  Remember to initiate Hypoglycemia Order Set & complete

## 2013-09-10 LAB — BASIC METABOLIC PANEL
ANION GAP: 11 (ref 5–15)
BUN: 21 mg/dL (ref 6–23)
CALCIUM: 9.2 mg/dL (ref 8.4–10.5)
CO2: 26 mEq/L (ref 19–32)
CREATININE: 1.74 mg/dL — AB (ref 0.50–1.10)
Chloride: 103 mEq/L (ref 96–112)
GFR calc Af Amer: 35 mL/min — ABNORMAL LOW (ref >90)
GFR, EST NON AFRICAN AMERICAN: 30 mL/min — AB (ref >90)
Glucose, Bld: 97 mg/dL (ref 70–99)
Potassium: 4.2 mEq/L (ref 3.7–5.3)
Sodium: 140 mEq/L (ref 137–147)

## 2013-09-10 LAB — GLUCOSE, CAPILLARY
GLUCOSE-CAPILLARY: 132 mg/dL — AB (ref 70–99)
GLUCOSE-CAPILLARY: 111 mg/dL — AB (ref 70–99)
GLUCOSE-CAPILLARY: 115 mg/dL — AB (ref 70–99)
Glucose-Capillary: 96 mg/dL (ref 70–99)

## 2013-09-10 MED ORDER — AMITRIPTYLINE HCL 50 MG PO TABS
50.0000 mg | ORAL_TABLET | Freq: Every day | ORAL | Status: DC
  Administered 2013-09-10 – 2013-09-11 (×2): 50 mg via ORAL
  Filled 2013-09-10 (×3): qty 1

## 2013-09-10 MED ORDER — SODIUM CHLORIDE 0.9 % IV SOLN
INTRAVENOUS | Status: DC
  Administered 2013-09-10: 13:00:00 via INTRAVENOUS
  Administered 2013-09-11: 1000 mL via INTRAVENOUS
  Administered 2013-09-11: 02:00:00 via INTRAVENOUS

## 2013-09-10 MED ORDER — CLONAZEPAM 1 MG PO TABS
1.0000 mg | ORAL_TABLET | Freq: Three times a day (TID) | ORAL | Status: DC
  Administered 2013-09-10 – 2013-09-11 (×5): 1 mg via ORAL
  Filled 2013-09-10 (×6): qty 1

## 2013-09-10 MED ORDER — SODIUM CHLORIDE 0.9 % IV SOLN: INTRAVENOUS | Status: DC

## 2013-09-10 NOTE — Consult Note (Signed)
[facility] Face-to-Face Psychiatry Consult   Reason for Consult:  Anxiety, involuntary movements Referring Physician:  Dr. [name] is an 63 y.o. female. Total Time spent with patient: 45 minutes  Assessment: AXIS I:  Anxiety Disorder NOS AXIS II:  Deferred AXIS III:   Past Medical History  Diagnosis Date  . Hypertension   . Diabetes mellitus   . Hyperlipidemia   . History of noncompliance with medical treatment   . Shingles 08/18/2013   AXIS IV:  other psychosocial or environmental problems AXIS V:  41-50 serious symptoms  Plan:  No evidence of imminent risk to self or others at present.   Patient does not meet criteria for psychiatric inpatient admission. Supportive therapy provided about ongoing stressors.  Subjective:   [name] is a 63 y.o. female patient admitted with CHF. She was just in the hospital last month with problems with ambulation. Workup has been negative including brain MRI. For the last week she has been developing involuntary ,movements in her face arms and legs. She has no history of antipsychotic use. Husband states she has no prior neurological history. 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 Pt has history of mild depression and was on Lexapro and Amitryptiline in the past. She states amitryptiline helped her sleep. She has been under stress lately as two grandchildren aged 52 and 66 moved back into her home. She physically helped moved them and got overwhelmed.The movements have been present for one week and she also claims that she "cannot see." She denies suicidal ideation, homicidal ideation, no history of substance use.  HPI:  See above HPI Elements:   Location:  global. Quality:  generalized. Severity:  severe. Timing:  one week. Duration:  several months. Context:  grandchildren moving back .  Past Psychiatric History: Past Medical History  Diagnosis Date  . Hypertension   . Diabetes mellitus   . Hyperlipidemia    . History of noncompliance with medical treatment   . Shingles 08/18/2013    reports that she has never smoked. She has never used smokeless tobacco. She reports that she does not drink alcohol or use illicit drugs. Family History  Problem Relation Age of Onset  . Diabetes    . Hypertension    . Hypertension Father   . Diabetes Father   . Hypertension Mother   . Diabetes Mother   . Diabetes Sister   . Hypertension Sister   . Diabetes Sister   . Hypertension Sister      Living Arrangements: Spouse/significant other;Other relatives   Abuse/Neglect [facility]) Physical Abuse: Denies Verbal Abuse: Denies Sexual Abuse: Denies Allergies:   Allergies  Allergen Reactions  . Codeine Nausea And Vomiting  . Grapefruit Concentrate Itching  . Morphine And Related Other (See Comments)    Extreme sedation  . Peanut-Containing Drug Products Nausea And Vomiting     Objective: Blood pressure 124/68, pulse 100, temperature 99.1 F (37.3 C), temperature source Oral, resp. rate 18, height _0  (1.676 m), weight 168 lb 3.2 oz (76.295 kg), SpO2 94.00%.Body mass index is 27.16 kg/(m^2). Results for orders placed during the hospital encounter of 09/05/13 (from the past 72 hour(s))  GLUCOSE, CAPILLARY     Status: Abnormal   Collection Time    09/07/13  2:31 PM      Result Value Ref Range   Glucose-Capillary 135 (*) 70 - 99 mg/dL  GLUCOSE, CAPILLARY     Status: Abnormal   Collection Time  09/07/13  4:41 PM      Result Value Ref Range   Glucose-Capillary 57 (*) 70 - 99 mg/dL  GLUCOSE, CAPILLARY     Status: Abnormal   Collection Time    09/07/13  5:24 PM      Result Value Ref Range   Glucose-Capillary 107 (*) 70 - 99 mg/dL  GLUCOSE, CAPILLARY     Status: Abnormal   Collection Time    09/07/13  9:09 PM      Result Value Ref Range   Glucose-Capillary 112 (*) 70 - 99 mg/dL  BASIC METABOLIC PANEL     Status: Abnormal   Collection Time    09/08/13  5:08 AM      Result Value Ref Range    Sodium 137  137 - 147 mEq/L   Potassium 3.9  3.7 - 5.3 mEq/L   Chloride 100  96 - 112 mEq/L   CO2 26  19 - 32 mEq/L   Glucose, Bld 126 (*) 70 - 99 mg/dL   BUN 24 (*) 6 - 23 mg/dL   Creatinine, Ser 1.94 (*) 0.50 - 1.10 mg/dL   Calcium 9.1  8.4 - 10.5 mg/dL   GFR calc non Af Amer 26 (*) >90 mL/min   GFR calc Af Amer 30 (*) >90 mL/min   Comment: (NOTE)     The eGFR has been calculated using the CKD EPI equation.     This calculation has not been validated in all clinical situations.     eGFR's persistently <90 mL/min signify possible Chronic Kidney     Disease.   Anion gap 11  5 - 15  GLUCOSE, CAPILLARY     Status: Abnormal   Collection Time    09/08/13  7:22 AM      Result Value Ref Range   Glucose-Capillary 131 (*) 70 - 99 mg/dL  GLUCOSE, CAPILLARY     Status: None   Collection Time    09/08/13 11:39 AM      Result Value Ref Range   Glucose-Capillary 91  70 - 99 mg/dL  GLUCOSE, CAPILLARY     Status: Abnormal   Collection Time    09/08/13  4:56 PM      Result Value Ref Range   Glucose-Capillary 68 (*) 70 - 99 mg/dL  GLUCOSE, CAPILLARY     Status: Abnormal   Collection Time    09/08/13  6:03 PM      Result Value Ref Range   Glucose-Capillary 112 (*) 70 - 99 mg/dL  GLUCOSE, CAPILLARY     Status: None   Collection Time    09/08/13  8:51 PM      Result Value Ref Range   Glucose-Capillary 82  70 - 99 mg/dL  BASIC METABOLIC PANEL     Status: Abnormal   Collection Time    09/09/13  4:17 AM      Result Value Ref Range   Sodium 142  137 - 147 mEq/L   Potassium 4.0  3.7 - 5.3 mEq/L   Chloride 105  96 - 112 mEq/L   CO2 27  19 - 32 mEq/L   Glucose, Bld 86  70 - 99 mg/dL   BUN 23  6 - 23 mg/dL   Creatinine, Ser 1.92 (*) 0.50 - 1.10 mg/dL   Calcium 9.3  8.4 - 10.5 mg/dL   GFR calc non Af Amer 26 (*) >90 mL/min   GFR calc Af Amer 31 (*) >90 mL/min   Comment: (NOTE)  The eGFR has been calculated using the CKD EPI equation.     This calculation has not been validated in all  clinical situations.     eGFR's persistently <90 mL/min signify possible Chronic Kidney     Disease.   Anion gap 10  5 - 15  GLUCOSE, CAPILLARY     Status: Abnormal   Collection Time    09/09/13  7:47 AM      Result Value Ref Range   Glucose-Capillary 59 (*) 70 - 99 mg/dL   Comment 1 Notify RN    GLUCOSE, CAPILLARY     Status: None   Collection Time    09/09/13  8:28 AM      Result Value Ref Range   Glucose-Capillary 95  70 - 99 mg/dL  GLUCOSE, CAPILLARY     Status: Abnormal   Collection Time    09/09/13 11:49 AM      Result Value Ref Range   Glucose-Capillary 107 (*) 70 - 99 mg/dL   Comment 1 Notify RN    GLUCOSE, CAPILLARY     Status: Abnormal   Collection Time    09/09/13  4:53 PM      Result Value Ref Range   Glucose-Capillary 51 (*) 70 - 99 mg/dL   Comment 1 Notify RN    GLUCOSE, CAPILLARY     Status: Abnormal   Collection Time    09/09/13  5:24 PM      Result Value Ref Range   Glucose-Capillary 61 (*) 70 - 99 mg/dL   Comment 1 Notify RN    GLUCOSE, CAPILLARY     Status: Abnormal   Collection Time    09/09/13  6:08 PM      Result Value Ref Range   Glucose-Capillary 154 (*) 70 - 99 mg/dL   Comment 1 Notify RN    GLUCOSE, CAPILLARY     Status: None   Collection Time    09/09/13  9:38 PM      Result Value Ref Range   Glucose-Capillary 84  70 - 99 mg/dL  GLUCOSE, CAPILLARY     Status: None   Collection Time    09/10/13  7:49 AM      Result Value Ref Range   Glucose-Capillary 96  70 - 99 mg/dL  BASIC METABOLIC PANEL     Status: Abnormal   Collection Time    09/10/13  7:50 AM      Result Value Ref Range   Sodium 140  137 - 147 mEq/L   Potassium 4.2  3.7 - 5.3 mEq/L   Chloride 103  96 - 112 mEq/L   CO2 26  19 - 32 mEq/L   Glucose, Bld 97  70 - 99 mg/dL   BUN 21  6 - 23 mg/dL   Creatinine, Ser 1.74 (*) 0.50 - 1.10 mg/dL   Calcium 9.2  8.4 - 10.5 mg/dL   GFR calc non Af Amer 30 (*) >90 mL/min   GFR calc Af Amer 35 (*) >90 mL/min   Comment: (NOTE)     The  eGFR has been calculated using the CKD EPI equation.     This calculation has not been validated in all clinical situations.     eGFR's persistently <90 mL/min signify possible Chronic Kidney     Disease.   Anion gap 11  5 - 15  GLUCOSE, CAPILLARY     Status: Abnormal   Collection Time    09/10/13 11:44 AM  Result Value Ref Range   Glucose-Capillary 132 (*) 70 - 99 mg/dL   Comment 1 Documented in Chart     Comment 2 Notify RN     Labs are reviewed and are pertinent for resolving renal failure  Current Facility-Administered Medications  Medication Dose Route Frequency Provider Last Rate Last Dose  . 0.9 %  sodium chloride infusion   Intravenous Continuous Donne Hazel, MD 75 mL/hr at 09/10/13 1245    . acetaminophen (TYLENOL) tablet 650 mg  650 mg Oral Q6H PRN Rise Patience, MD   650 mg at 09/09/13 6659  . aspirin EC tablet 81 mg  81 mg Oral Daily Rise Patience, MD   81 mg at 09/10/13 9357  . atorvastatin (LIPITOR) tablet 10 mg  10 mg Oral q1800 Rise Patience, MD   10 mg at 09/09/13 1735  . carvedilol (COREG) tablet 6.25 mg  6.25 mg Oral BID WC Rise Patience, MD   6.25 mg at 09/10/13 0177  . clonazePAM (KLONOPIN) tablet 0.5 mg  0.5 mg Oral 2 times per day Thurnell Lose, MD   0.5 mg at 09/10/13 0918  . enoxaparin (LOVENOX) injection 40 mg  40 mg Subcutaneous Q24H Rise Patience, MD   40 mg at 09/10/13 0919  . glucose-Vitamin C 4-0.006 GM per chewable tablet 4 tablet  4 tablet Oral PRN Donne Hazel, MD      . insulin aspart (novoLOG) injection 0-9 Units  0-9 Units Subcutaneous TID WC Rise Patience, MD   1 Units at 09/08/13 0800  . insulin glargine (LANTUS) injection 10 Units  10 Units Subcutaneous BID Donne Hazel, MD      . levofloxacin [facility]) tablet 750 mg  750 mg Oral Q48H Lauren Bajbus, RPH   750 mg at 09/10/13 9390  . methocarbamol (ROBAXIN) tablet 500 mg  500 mg Oral Q8H PRN Thurnell Lose, MD   500 mg at 09/06/13 1614  .  naphazoline (NAPHCON) 0.1 % ophthalmic solution 2 drop  2 drop Both Eyes QID PRN Donne Hazel, MD   2 drop at 09/09/13 1736  . ondansetron (ZOFRAN) injection 4 mg  4 mg Intravenous Q6H PRN Rise Patience, MD      . sodium chloride 0.9 % injection 3 mL  3 mL Intravenous Q12H Rise Patience, MD   3 mL at 09/10/13 0919  . sodium chloride 0.9 % injection 3 mL  3 mL Intravenous Q12H Rise Patience, MD   3 mL at 09/10/13 1000    Psychiatric Specialty Exam:     Blood pressure 124/68, pulse 100, temperature 99.1 F (37.3 C), temperature source Oral, resp. rate 18, height _0  (1.676 m), weight 168 lb 3.2 oz (76.295 kg), SpO2 94.00%.Body mass index is 27.16 kg/(m^2).  General Appearance: Bizarre and Disheveled constantly moving in the bed  Eye Contact::  Fair  Speech:  Clear and Coherent  Volume:  Normal  Mood:  Anxious  Affect:  Full Range  Thought Process:  Disorganized  Orientation:  Full (Time, Place, and Person)  Thought Content:  Rumination  Suicidal Thoughts:  No  Homicidal Thoughts:  No  Memory:  Immediate;   Fair Recent;   Fair Remote;   Fair  Judgement:  Poor  Insight:  Lacking  Psychomotor Activity:  Increased and constant choreioform movements in arms and legs, closing and opening eyes  Concentration:  Poor  Recall:  [place]  Language:  Fair  Akathisia:  No  Handed:  Right  AIMS (if indicated):     Assets:  Communication Skills Desire for Improvement Social Support  Sleep:   poor   Musculoskeletal: Strength & Muscle Tone: within normal limits Gait & Station: unsteady Patient leans: N/A  Treatment Plan Summary: Daily contact with patient to assess and evaluate symptoms and progress in treatment Medication management I think this presentation is consistent with conversion disorder. She has no reason for tardive dyskinesia as the meds she was on(gabapentin and Lexapro) do not cause this.She has no history onf antipsychotic  use. Will increase clonazepam and restart Amitryptiline to help with sleep  Carole Doner, [facility] 09/10/2013 1:54 PM

## 2013-09-10 NOTE — Evaluation (Signed)
Physical Therapy Evaluation Patient Details Name: [name] MRN: [id number] DOB: 1950/05/19 Today's Date: 09/10/2013   History of Present Illness  admitted with involuntary movements of all extremities and decompensated CHF.  Neuro w/u negative.  Psych w/u feels involuntary movements are compatible with conversion disorder  Clinical Impression  Patient did well with mobility today.  Involuntary movements did not appear to affect balance or mobility.  Feel patient close to baseline with mobility and feel she can benefit from continued PT to progress mobility for and once discharged.  Recommend HHPT at discharge for progressive mobility and strengthening exercises.      Follow Up Recommendations Home health PT;Supervision for mobility/OOB    Equipment Recommendations  None recommended by PT    Recommendations for Other Services       Precautions / Restrictions Precautions Precautions: Fall      Mobility  Bed Mobility Overal bed mobility: Independent                Transfers Overall transfer level: Needs assistance Equipment used: Rolling walker (2 wheeled) Transfers: Sit to/from Stand Sit to Stand: Min guard         General transfer comment: due to involuntary movements - concerned for balance  Ambulation/Gait Ambulation/Gait assistance: Min guard Ambulation Distance (Feet): 100 Feet Assistive device: Rolling walker (2 wheeled) Gait Pattern/deviations: Step-through pattern     General Gait Details: involuntary movements continued during gait, did not appear to affect balance  Stairs            Wheelchair Mobility    Modified Rankin (Stroke Patients Only)       Balance Overall balance assessment: No apparent balance deficits (not formally assessed)                                           Pertinent Vitals/Pain Pain Assessment: No/denies pain    Home Living Family/patient expects to be discharged to:: Private  residence Living Arrangements: Spouse/significant other;Other relatives Available Help at Discharge: Family;Available PRN/intermittently Type of Home: House       Home Layout: Two level;Bed/bath upstairs Home Equipment: Cane - single point;Shower seat;Walker - 4 wheels      Prior Function Level of Independence: Independent with assistive device(s)         Comments: patient reports she stays upstairs all day and does not get out of bed when alone     Hand Dominance   Dominant Hand: Right    Extremity/Trunk Assessment   Upper Extremity Assessment: Generalized weakness           Lower Extremity Assessment: Generalized weakness      Cervical / Trunk Assessment: Normal  Communication   Communication: No difficulties  Cognition Arousal/Alertness: Awake/alert Behavior During Therapy: WFL for tasks assessed/performed Overall Cognitive Status: Within Functional Limits for tasks assessed                      General Comments      Exercises General Exercises - Upper Extremity Shoulder Flexion: AROM;Strengthening;Both;10 reps;Seated Elbow Flexion: AROM;Strengthening;Both;10 reps;Seated General Exercises - Lower Extremity Ankle Circles/Pumps: AROM;Strengthening;Both;10 reps;Seated Gluteal Sets: AROM;Strengthening;Both;10 reps;Seated Long Arc Quad: AROM;Strengthening;Both;10 reps;Seated Hip Flexion/Marching: AROM;Strengthening;Both;10 reps;Seated      Assessment/Plan    PT Assessment All further PT needs can be met in the next venue of care  PT Diagnosis Difficulty walking   PT  Problem List Decreased activity tolerance;Decreased mobility;Impaired sensation;Impaired tone  PT Treatment Interventions Neuromuscular re-education;Functional mobility training;Therapeutic activities;Therapeutic exercise;Gait training   PT Goals (Current goals can be found in the Care Plan section) Acute Rehab PT Goals Patient Stated Goal: go home tomorrow PT Goal Formulation:  With patient Time For Goal Achievement: 09/17/13 Potential to Achieve Goals: Good    Frequency Min 3X/week   Barriers to discharge Decreased caregiver support      Co-evaluation               End of Session Equipment Utilized During Treatment: Gait belt Activity Tolerance: Patient tolerated treatment well Patient left: in bed;with call bell/phone within reach           Time: 1555-1614 PT Time Calculation (min): 19 min   Charges:   PT Evaluation $Initial PT Evaluation Tier I: 1 Procedure PT Treatments $Therapeutic Exercise: 8-22 mins   PT G Codes:          Shanna Cisco 09/10/2013, 4:24 PM

## 2013-09-10 NOTE — Progress Notes (Signed)
[facility] PROGRESS NOTE  [name] QQV:[id number] DOB: 01-06-1951 DOA: 09/05/2013 PCP: Angelica Chessman, MD  Assessment/Plan: 1. Acutely Decompensated Chronic systolic heart failure - last EF measured during this month was 40-45%.. 1. Thus far, pt is down over 35L 2. Wt is 76.3kg, dry weight of around 78kg 3. Pt now off o2 4. Lasix currently on hold and pt on gentle IVF as renal fx has worsened (see below) 5. ARB remains on hold 2. Involuntary movements - Discontinued patient's Neurontin and Lexapro for now. Continued Klonopin. 1. Involuntary movements remain 2. Appreciated Neuro input 3. Recs for clonazepam and outpt f/u with movement clinic 4. Recently discussed with pt's sister at bedside. Pt has been undergoing much stress in her life over the past several years that had "messed her up" and that pt had noted improvement with psychiatry in the past 5. Psych has been consulted 3. UTI - Patient was initially on ceftriaxone. 1. >100,000 pan-sensitive Klebsiella on urine cx 2. Now on levaquin based on urine cx 4. Chronic Anemia - patient's hemoglobin has further decreased. Patient has had recent anemia panel which showed ferritin of around 80.  1. - Cont to follow CBC. 5. Chronic kidney disease with mild worsening 1. Pt had been intentionally decreasing PO fluid intake while on lasix after admit to minimize voiding 2. Cr improving with gentle IVF with a peak Cr of 1.9 3. Cont with gentle IVF for now and hold ARB 6. Diabetes mellitus - holding metformin due to renal failure. Continue home medications otherwise.  7. Hypertension - Stable presently. Now holding ARB per above  Code Status: Full Family Communication: Pt in room Disposition Plan: Pending  Consultants:  Neurology  Procedures:    Antibiotics:  Rocephin 8/17>>> (indicate start date, and stop date if known)  Levaquin 8/20>>>  HPI/Subjective: Still complains of involuntary movement. No acute  events noted overnight  Objective: Filed Vitals:   09/09/13 1336 09/09/13 2100 09/10/13 0500 09/10/13 0555  BP: 116/55 114/63 123/26 124/68  Pulse: 91 107 100   Temp: 98.8 F (37.1 C) 99.1 F (37.3 C) 99.1 F (37.3 C)   TempSrc: Oral     Resp: 20 18 18    Height:      Weight:   76.295 kg (168 lb 3.2 oz)   SpO2: 94% 95% 94%     Intake/Output Summary (Last 24 hours) at 09/10/13 1236 Last data filed at 09/10/13 0800  Gross per 24 hour  Intake   1445 ml  Output   1525 ml  Net    -80 ml   Filed Weights   09/08/13 0500 09/09/13 0500 09/10/13 0500  Weight: 76.613 kg (168 lb 14.4 oz) 76.3 kg (168 lb 3.4 oz) 76.295 kg (168 lb 3.2 oz)    Exam:   General:  Awake, in nad  Cardiovascular: regular, s1, s2  Respiratory: normal resp effort, no wheezing  Abdomen: soft,nondistended  Musculoskeletal: perfused, no clubbing, involuntary B UE and BLE movements in room, sparing face   Data Reviewed: Basic Metabolic Panel:  Recent Labs Lab 09/05/13 1913 09/08/13 0508 09/09/13 0417 09/10/13 0750  NA 140 137 142 140  K 4.4 3.9 4.0 4.2  CL 106 100 105 103  CO2 24 26 27 26   GLUCOSE 98 126* 86 97  BUN 18 24* 23 21  CREATININE 1.57* 1.94* 1.92* 1.74*  CALCIUM 9.2 9.1 9.3 9.2   Liver Function Tests: No results found for this basename: AST, ALT, ALKPHOS, BILITOT, PROT, ALBUMIN,  in  the last 168 hours No results found for this basename: LIPASE, AMYLASE,  in the last 168 hours No results found for this basename: AMMONIA,  in the last 168 hours CBC:  Recent Labs Lab 09/05/13 1913  WBC 9.2  NEUTROABS 6.2  HGB 9.1*  HCT 28.3*  MCV 87.6  PLT 266   Cardiac Enzymes:  Recent Labs Lab 09/06/13 0035 09/06/13 0619 09/06/13 1155  TROPONINI <0.30 <0.30 <0.30   BNP (last 3 results)  Recent Labs  09/06/13 0035  PROBNP 1129.0*   CBG:  Recent Labs Lab 09/09/13 1724 09/09/13 1808 09/09/13 2138 09/10/13 0749 09/10/13 1144  GLUCAP 61* 154* 84 96 132*    Recent  Results (from the past 240 hour(s))  URINE CULTURE     Status: None   Collection Time    09/05/13  7:55 PM      Result Value Ref Range Status   Specimen Description URINE, RANDOM   Final   Special Requests NONE   Final   Culture  Setup Time     Final   Value: 09/05/2013 23:24     Performed at [facility]     Final   Value: >=100,000 COLONIES/ML     Performed at [facility]   Culture     Final   Value: KLEBSIELLA PNEUMONIAE     Performed at [facility]   Report Status 09/08/2013 FINAL   Final   Organism ID, Bacteria KLEBSIELLA PNEUMONIAE   Final  CULTURE, BLOOD (ROUTINE X 2)     Status: None   Collection Time    09/06/13  9:10 AM      Result Value Ref Range Status   Specimen Description BLOOD RIGHT ANTECUBITAL   Final   Special Requests BOTTLES DRAWN AEROBIC AND ANAEROBIC 5CC   Final   Culture  Setup Time     Final   Value: 09/06/2013 14:35     Performed at [facility]   Culture     Final   Value:        BLOOD CULTURE RECEIVED NO GROWTH TO DATE CULTURE WILL BE HELD FOR 5 DAYS BEFORE ISSUING A FINAL NEGATIVE REPORT     Performed at [facility]   Report Status PENDING   Incomplete  CULTURE, BLOOD (ROUTINE X 2)     Status: None   Collection Time    09/06/13  9:15 AM      Result Value Ref Range Status   Specimen Description BLOOD RIGHT HAND   Final   Special Requests BOTTLES DRAWN AEROBIC ONLY [place]   Final   Culture  Setup Time     Final   Value: 09/06/2013 14:35     Performed at [facility]   Culture     Final   Value:        BLOOD CULTURE RECEIVED NO GROWTH TO DATE CULTURE WILL BE HELD FOR 5 DAYS BEFORE ISSUING A FINAL NEGATIVE REPORT     Performed at [facility]   Report Status PENDING   Incomplete     Studies: No results found.  Scheduled Meds: . aspirin EC  81 mg Oral Daily  . atorvastatin  10 mg Oral q1800  . carvedilol  6.25 mg Oral BID WC  . clonazePAM  0.5 mg Oral 2 times per day  .  enoxaparin (LOVENOX) injection  40 mg Subcutaneous Q24H  . insulin aspart  0-9 Units Subcutaneous TID WC  .  insulin glargine  10 Units Subcutaneous BID  . levofloxacin  750 mg Oral Q48H  . sodium chloride  3 mL Intravenous Q12H  . sodium chloride  3 mL Intravenous Q12H   Continuous Infusions: . sodium chloride    . sodium chloride      Principal Problem:   Systolic CHF, acute Active Problems:   HYPERTENSION   Diabetes mellitus type 2, uncontrolled   Involuntary movements   CKD (chronic kidney disease), stage III   Anemia   UTI (lower urinary tract infection)   CHF (congestive heart failure)  Time spent: 84min  [facility], [facility] Pager [phone]. If 7PM-7AM, please contact night-coverage at [email or web address], password [facility] 09/10/2013, 12:36 PM  LOS: 5 days

## 2013-09-11 DIAGNOSIS — R29898 Other symptoms and signs involving the musculoskeletal system: Secondary | ICD-10-CM

## 2013-09-11 LAB — GLUCOSE, CAPILLARY
GLUCOSE-CAPILLARY: 107 mg/dL — AB (ref 70–99)
GLUCOSE-CAPILLARY: 111 mg/dL — AB (ref 70–99)
GLUCOSE-CAPILLARY: 178 mg/dL — AB (ref 70–99)
Glucose-Capillary: 177 mg/dL — ABNORMAL HIGH (ref 70–99)

## 2013-09-11 LAB — BASIC METABOLIC PANEL
ANION GAP: 13 (ref 5–15)
BUN: 22 mg/dL (ref 6–23)
CO2: 21 mEq/L (ref 19–32)
Calcium: 8.8 mg/dL (ref 8.4–10.5)
Chloride: 104 mEq/L (ref 96–112)
Creatinine, Ser: 1.8 mg/dL — ABNORMAL HIGH (ref 0.50–1.10)
GFR calc Af Amer: 33 mL/min — ABNORMAL LOW (ref >90)
GFR calc non Af Amer: 29 mL/min — ABNORMAL LOW (ref >90)
Glucose, Bld: 115 mg/dL — ABNORMAL HIGH (ref 70–99)
Potassium: 4.1 mEq/L (ref 3.7–5.3)
SODIUM: 138 meq/L (ref 137–147)

## 2013-09-11 MED ORDER — CIPROFLOXACIN HCL 750 MG PO TABS
750.0000 mg | ORAL_TABLET | Freq: Every day | ORAL | Status: AC
  Administered 2013-09-12 – 2013-09-14 (×3): 750 mg via ORAL
  Filled 2013-09-11 (×4): qty 1

## 2013-09-11 NOTE — Evaluation (Signed)
Occupational Therapy Evaluation Patient Details Name: CASS EDINGER MRN: [id number] DOB: December 19, 1950 Today's Date: 09/11/2013    History of Present Illness  63 y.o. admitted with involuntary movements of all extremities and decompensated CHF. Neuro w/u negative. Psych w/u feels involuntary movements are compatible with conversion     Clinical Impression   Pt admitted with above. Feel pt will benefit from acute OT to increase independence and strength prior to d/c.     Follow Up Recommendations  No OT follow up;Supervision - Intermittent (for mobility/OOB)    Equipment Recommendations  None recommended by OT    Recommendations for Other Services       Precautions / Restrictions Precautions Precautions: Fall Restrictions Weight Bearing Restrictions: No      Mobility Bed Mobility Overal bed mobility: Modified Independent                Transfers Overall transfer level: Needs assistance Equipment used: Rolling walker (2 wheeled) Transfers: Sit to/from Stand Sit to Stand: Min guard         General transfer comment: Min guard for safety.    Balance                                            ADL Overall ADL's : Needs assistance/impaired     Grooming: Oral care;Applying deodorant;Standing;Min guard   Upper Body Bathing: Min guard;Standing   Lower Body Bathing: Min guard;Sit to/from stand           Toilet Transfer: Min guard;Ambulation;RW;Regular Toilet;Grab bars   Toileting- Clothing Manipulation and Hygiene: Min guard;Sit to/from stand   Tub/ Shower Transfer: Minimal assistance;Ambulation;Rolling walker (assist to position walker)   Functional mobility during ADLs: Min guard;Rolling walker General ADL Comments: Educated on safety tips for home (safe shoewear, sitting for LB ADLs). Practiced tub transfer and told pt to not grab her shower rod but to use wall instead for stability. Recommended family be with her for tub transfer  and told family this.  Educated on use of bag on walker.     Vision                     Perception     Praxis      Pertinent Vitals/Pain Pain Assessment: 0-10 Pain Score: 9  Pain Location: legs Pain Descriptors / Indicators: Sore     Hand Dominance Right   Extremity/Trunk Assessment Upper Extremity Assessment Upper Extremity Assessment: Generalized weakness   Lower Extremity Assessment Lower Extremity Assessment: Defer to PT evaluation       Communication Communication Communication: No difficulties   Cognition Arousal/Alertness: Awake/alert Behavior During Therapy: WFL for tasks assessed/performed Overall Cognitive Status: Within Functional Limits for tasks assessed                     General Comments       Exercises Exercises: Other exercises Other Exercises Other Exercises: performed approximately 10 reps each of shoulder flexion with theraband.   Shoulder Instructions      Home Living Family/patient expects to be discharged to:: Private residence Living Arrangements: Spouse/significant other;Other relatives Available Help at Discharge: Family;Available PRN/intermittently Type of Home: House       Home Layout: Two level;Bed/bath upstairs Alternate Level Stairs-Number of Steps: 12- pt reports she crawls up the steps at home   Bathroom Shower/Tub: Tub/shower unit;Curtain  Bathroom Toilet: Standard (sink close)     Home Equipment: Cane - single point;Shower seat;Walker - 4 wheels          Prior Functioning/Environment Level of Independence: Independent with assistive device(s)             OT Diagnosis: Acute pain;Generalized weakness   OT Problem List: Decreased strength;Decreased knowledge of use of DME or AE;Decreased knowledge of precautions;Pain   OT Treatment/Interventions: Self-care/ADL training;Therapeutic exercise;DME and/or AE instruction;Therapeutic activities;Patient/family education;Balance training    OT  Goals(Current goals can be found in the care plan section) Acute Rehab OT Goals Patient Stated Goal: not stated OT Goal Formulation: With patient Time For Goal Achievement: 09/18/13 Potential to Achieve Goals: Good ADL Goals Pt Will Perform Lower Body Dressing: with modified independence;sit to/from stand (including gathering supplies) Pt Will Transfer to Toilet: with modified independence;ambulating;regular height toilet;grab bars Additional ADL Goal #1: Pt will independently perform HEP to increase strength in UE's.  OT Frequency: Min 2X/week   Barriers to D/C:            Co-evaluation              End of Session Equipment Utilized During Treatment: Gait belt;Rolling walker  Activity Tolerance: Patient tolerated treatment well Patient left:     Time: 9450-3888 OT Time Calculation (min): 20 min Charges:  OT General Charges $OT Visit: 1 Procedure OT Evaluation $Initial OT Evaluation Tier I: 1 Procedure OT Treatments $Self Care/Home Management : 8-22 mins G-CodesBenito Mccreedy OTR/L [phone] 09/11/2013, 5:52 PM

## 2013-09-11 NOTE — Progress Notes (Addendum)
ANTIBIOTIC CONSULT NOTE - FOLLOW UP  Pharmacy Consult for Levaquin Indication: UTI  Allergies  Allergen Reactions  . Codeine Nausea And Vomiting  . Grapefruit Concentrate Itching  . Morphine And Related Other (See Comments)    Extreme sedation  . Peanut-Containing Drug Products Nausea And Vomiting   Patient Measurements: Height: 5\' 6"  (167.6 cm) Weight: 168 lb 3.2 oz (76.295 kg) IBW/kg (Calculated) : 59.3  Vital Signs: Temp: 100.2 F (37.9 C) (08/23 0500) BP: 151/82 mmHg (08/23 1049) Pulse Rate: 109 (08/23 1049) Intake/Output from previous day: 08/22 0701 - 08/23 0700 In: 120 [P.O.:120] Out: 2400 [Urine:2400] Intake/Output from this shift: Total I/O In: 3 [I.V.:3] Out: -   Labs:  Recent Labs  09/09/13 0417 09/10/13 0750 09/11/13 0456  CREATININE 1.92* 1.74* 1.80*   Estimated Creatinine Clearance: 32.9 ml/min (by C-G formula based on Cr of 1.8). No results found for this basename: VANCOTROUGH, Corlis Leak, VANCORANDOM, GENTTROUGH, GENTPEAK, GENTRANDOM, TOBRATROUGH, TOBRAPEAK, TOBRARND, AMIKACINPEAK, AMIKACINTROU, AMIKACIN,  in the last 72 hours   Microbiology: Recent Results (from the past 720 hour(s))  MRSA PCR SCREENING     Status: None   Collection Time    08/17/13  7:30 PM      Result Value Ref Range Status   MRSA by PCR NEGATIVE  NEGATIVE Final   Comment:            The GeneXpert MRSA Assay (FDA     approved for NASAL specimens     only), is one component of a     comprehensive MRSA colonization     surveillance program. It is not     intended to diagnose MRSA     infection nor to guide or     monitor treatment for     MRSA infections.  URINE CULTURE     Status: None   Collection Time    09/05/13  7:55 PM      Result Value Ref Range Status   Specimen Description URINE, RANDOM   Final   Special Requests NONE   Final   Culture  Setup Time     Final   Value: 09/05/2013 23:24     Performed at [place]     Final   Value:  >=100,000 COLONIES/ML     Performed at [facility]   Culture     Final   Value: KLEBSIELLA PNEUMONIAE     Performed at [facility]   Report Status 09/08/2013 FINAL   Final   Organism ID, Bacteria KLEBSIELLA PNEUMONIAE   Final  CULTURE, BLOOD (ROUTINE X 2)     Status: None   Collection Time    09/06/13  9:10 AM      Result Value Ref Range Status   Specimen Description BLOOD RIGHT ANTECUBITAL   Final   Special Requests BOTTLES DRAWN AEROBIC AND ANAEROBIC 5CC   Final   Culture  Setup Time     Final   Value: 09/06/2013 14:35     Performed at [facility]   Culture     Final   Value:        BLOOD CULTURE RECEIVED NO GROWTH TO DATE CULTURE WILL BE HELD FOR 5 DAYS BEFORE ISSUING A FINAL NEGATIVE REPORT     Performed at [facility]   Report Status PENDING   Incomplete  CULTURE, BLOOD (ROUTINE X 2)     Status: None   Collection Time    09/06/13  9:15 AM      Result Value Ref Range Status   Specimen Description BLOOD RIGHT HAND   Final   Special Requests BOTTLES DRAWN AEROBIC ONLY [place]   Final   Culture  Setup Time     Final   Value: 09/06/2013 14:35     Performed at [facility]   Culture     Final   Value:        BLOOD CULTURE RECEIVED NO GROWTH TO DATE CULTURE WILL BE HELD FOR 5 DAYS BEFORE ISSUING A FINAL NEGATIVE REPORT     Performed at [facility]   Report Status PENDING   Incomplete    Anti-infectives   Start     Dose/Rate Route Frequency Ordered Stop   09/08/13 1100  levofloxacin (LEVAQUIN) tablet 750 mg     750 mg Oral Every 48 hours 09/08/13 1012     09/06/13 0600  cefTRIAXone (ROCEPHIN) 1 g in dextrose 5 % 50 mL IVPB  Status:  Discontinued     1 g 100 mL/hr over 30 Minutes Intravenous Daily 09/06/13 0256 09/08/13 0954   09/06/13 0045  ciprofloxacin (CIPRO) IVPB 400 mg     400 mg 200 mL/hr over 60 Minutes Intravenous  Once 09/06/13 0031 09/06/13 0221      Assessment: 63 YOF with pan sensitive klebsielle pneumonia UTI  being transitioned to levofloxacin from ceftriaxone. She received 2 doses of ceftriaxone. SCr 1.8 with est CrCl ~73mL/min. WBC wnl, Tmax 100.7 on 8/20. Per Dr. Wyline Copas, would like to continue therapy as pt still showing some symptoms of infection. May consider a different abx if no resolution of symptoms soon.  Goal of Therapy:  Eradication of infection  Plan:  Continue Levofloxacin 750mg  PO Q48 Follow clinical progression, VS, renal fucntion, LOT  Tresten Pantoja J 09/11/2013,12:17 PM  D/C Levaquin, start Cipro 750mg  daily x 3 days.

## 2013-09-11 NOTE — Progress Notes (Signed)
[facility] PROGRESS NOTE  [name] WLN:[id number] DOB: 1950-09-18 DOA: 09/05/2013 PCP: Angelica Chessman, MD  Assessment/Plan: 1. Acutely Decompensated Chronic systolic heart failure - last EF measured during this month was 40-45%.. 1. Thus far, pt is down over -6.7L 2. Wt is 76.3kg, dry weight of around 78kg 3. Pt now off o2 4. Lasix currently on hold and pt on gentle IVF as renal fx has worsened (see below) 5. ARB remains on hold 2. Involuntary movements - Discontinued patient's Neurontin and Lexapro for now. Continued Klonopin. 1. Involuntary movements remain 2. Appreciated Neuro input 3. Recs for clonazepam and outpt f/u with movement clinic 4. Recently discussed with pt's sister at bedside. Pt has been undergoing much stress in her life over the past several years that had "messed her up" and that pt had noted improvement with psychiatry in the past 5. Psych input was appreciated. Susected conversion disorder per psychiatry 3. UTI - Patient was initially on ceftriaxone. 1. >100,000 pan-sensitive Klebsiella on urine cx 2. Currently on levaquin based on urine cx but pt still with fevers 3. Consider transition to cipro 4. Chronic Anemia - patient's hemoglobin has further decreased. Patient has had recent anemia panel which showed ferritin of around 80.  1. - Cont to follow CBC. 5. Chronic kidney disease with mild worsening 1. Pt had been intentionally decreasing PO fluid intake while on lasix after admit to minimize voiding 2. Cr overall improving with gentle IVF with a peak Cr of 1.9 3. Cont with gentle IVF for now and hold ARB 6. Diabetes mellitus - holding metformin due to renal failure. Continue home medications otherwise.  7. Hypertension - Stable presently. Now holding ARB per above  Code Status: Full Family Communication: Pt in room Disposition Plan: Pending  Consultants:  Neurology  Procedures:    Antibiotics:  Rocephin 8/17>>> (indicate start  date, and stop date if known)  Levaquin 8/20>>>8/23  Ciprofloxacin 8/23>>>  HPI/Subjective: No acute events noted overnight  Objective: Filed Vitals:   09/10/13 1430 09/10/13 2100 09/11/13 0500 09/11/13 1049  BP: 129/64 119/93 108/63 151/82  Pulse: 105 107 71 109  Temp: 98.4 F (36.9 C) 99.1 F (37.3 C) 100.2 F (37.9 C)   TempSrc: Oral     Resp: 18 18 16    Height:      Weight:      SpO2: 99% 96% 96%     Intake/Output Summary (Last 24 hours) at 09/11/13 1230 Last data filed at 09/11/13 1052  Gross per 24 hour  Intake    123 ml  Output   2000 ml  Net  -1877 ml   Filed Weights   09/08/13 0500 09/09/13 0500 09/10/13 0500  Weight: 76.613 kg (168 lb 14.4 oz) 76.3 kg (168 lb 3.4 oz) 76.295 kg (168 lb 3.2 oz)    Exam:   General:  Awake, in nad  Cardiovascular: regular, s1, s2  Respiratory: normal resp effort, no wheezing  Abdomen: soft,nondistended  Musculoskeletal: perfused, no clubbing, involuntary B UE and BLE movements in room, sparing face   Data Reviewed: Basic Metabolic Panel:  Recent Labs Lab 09/05/13 1913 09/08/13 0508 09/09/13 0417 09/10/13 0750 09/11/13 0456  NA 140 137 142 140 138  K 4.4 3.9 4.0 4.2 4.1  CL 106 100 105 103 104  CO2 24 26 27 26 21   GLUCOSE 98 126* 86 97 115*  BUN 18 24* 23 21 22   CREATININE 1.57* 1.94* 1.92* 1.74* 1.80*  CALCIUM 9.2 9.1 9.3 9.2 8.8  Liver Function Tests: No results found for this basename: AST, ALT, ALKPHOS, BILITOT, PROT, ALBUMIN,  in the last 168 hours No results found for this basename: LIPASE, AMYLASE,  in the last 168 hours No results found for this basename: AMMONIA,  in the last 168 hours CBC:  Recent Labs Lab 09/05/13 1913  WBC 9.2  NEUTROABS 6.2  HGB 9.1*  HCT 28.3*  MCV 87.6  PLT 266   Cardiac Enzymes:  Recent Labs Lab 09/06/13 0035 09/06/13 0619 09/06/13 1155  TROPONINI <0.30 <0.30 <0.30   BNP (last 3 results)  Recent Labs  09/06/13 0035  PROBNP 1129.0*    CBG:  Recent Labs Lab 09/10/13 1144 09/10/13 1633 09/10/13 2016 09/11/13 0726 09/11/13 1139  GLUCAP 132* 111* 115* 107* 177*    Recent Results (from the past 240 hour(s))  URINE CULTURE     Status: None   Collection Time    09/05/13  7:55 PM      Result Value Ref Range Status   Specimen Description URINE, RANDOM   Final   Special Requests NONE   Final   Culture  Setup Time     Final   Value: 09/05/2013 23:24     Performed at [place]     Final   Value: >=100,000 COLONIES/ML     Performed at [facility]   Culture     Final   Value: KLEBSIELLA PNEUMONIAE     Performed at [facility]   Report Status 09/08/2013 FINAL   Final   Organism ID, Bacteria KLEBSIELLA PNEUMONIAE   Final  CULTURE, BLOOD (ROUTINE X 2)     Status: None   Collection Time    09/06/13  9:10 AM      Result Value Ref Range Status   Specimen Description BLOOD RIGHT ANTECUBITAL   Final   Special Requests BOTTLES DRAWN AEROBIC AND ANAEROBIC 5CC   Final   Culture  Setup Time     Final   Value: 09/06/2013 14:35     Performed at [facility]   Culture     Final   Value:        BLOOD CULTURE RECEIVED NO GROWTH TO DATE CULTURE WILL BE HELD FOR 5 DAYS BEFORE ISSUING A FINAL NEGATIVE REPORT     Performed at [facility]   Report Status PENDING   Incomplete  CULTURE, BLOOD (ROUTINE X 2)     Status: None   Collection Time    09/06/13  9:15 AM      Result Value Ref Range Status   Specimen Description BLOOD RIGHT HAND   Final   Special Requests BOTTLES DRAWN AEROBIC ONLY [place]   Final   Culture  Setup Time     Final   Value: 09/06/2013 14:35     Performed at [facility]   Culture     Final   Value:        BLOOD CULTURE RECEIVED NO GROWTH TO DATE CULTURE WILL BE HELD FOR 5 DAYS BEFORE ISSUING A FINAL NEGATIVE REPORT     Performed at [facility]   Report Status PENDING   Incomplete     Studies: No results found.  Scheduled  Meds: . amitriptyline  50 mg Oral QHS  . aspirin EC  81 mg Oral Daily  . atorvastatin  10 mg Oral q1800  . carvedilol  6.25 mg Oral BID WC  . clonazePAM  1 mg  Oral TID  . enoxaparin (LOVENOX) injection  40 mg Subcutaneous Q24H  . insulin aspart  0-9 Units Subcutaneous TID WC  . insulin glargine  10 Units Subcutaneous BID  . levofloxacin  750 mg Oral Q48H  . sodium chloride  3 mL Intravenous Q12H  . sodium chloride  3 mL Intravenous Q12H   Continuous Infusions: . sodium chloride 75 mL/hr at 09/11/13 0223    Principal Problem:   Systolic CHF, acute Active Problems:   HYPERTENSION   Diabetes mellitus type 2, uncontrolled   Involuntary movements   CKD (chronic kidney disease), stage III   Anemia   UTI (lower urinary tract infection)   CHF (congestive heart failure)  Time spent: 68min  Teriah Muela, [place] [facility] Pager [phone]. If 7PM-7AM, please contact night-coverage at [email or web address], password [facility] 09/11/2013, 12:30 PM  LOS: 6 days

## 2013-09-11 NOTE — Consult Note (Signed)
[facility] Face-to-Face Psychiatry Consult   Reason for Consult:  Anxiety, involuntary movements Referring Physician:  Dr. [name] is an 63 y.o. female. Total Time spent with patient: 20 minutes  Assessment: AXIS I:  Anxiety Disorder NOS AXIS II:  Deferred AXIS III:   Past Medical History  Diagnosis Date  . Hypertension   . Diabetes mellitus   . Hyperlipidemia   . History of noncompliance with medical treatment   . Shingles 08/18/2013   AXIS IV:  other psychosocial or environmental problems AXIS V:  41-50 serious symptoms  Plan:  No evidence of imminent risk to self or others at present.   Patient does not meet criteria for psychiatric inpatient admission. Supportive therapy provided about ongoing stressors.  Subjective:   [name] is a 63 y.o. female patient admitted with CHF. She was just in the hospital last month with problems with ambulation. Workup has been negative including brain MRI. For the last week she has been developing involuntary ,movements in her face arms and legs. She has no history of antipsychotic use. Husband states she has no prior neurological history. 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 Pt has history of mild depression and was on Lexapro and Amitryptiline in the past. She states amitryptiline helped her sleep. She has been under stress lately as two grandchildren aged 64 and 52 moved back into her home. She physically helped moved them and got overwhelmed.The movements have been present for one week and she also claims that she "cannot see." She denies suicidal ideation, homicidal ideation, no history of substance use.  Pt seen today Hernandez. She is still exhibiting abnormal movements but much less than yesterday. States that her mood is good.She slept well last night on amitryptiline. No psychotic symptoms  HPI:  See above HPI Elements:   Location:  global. Quality:  generalized. Severity:  severe. Timing:  one  week. Duration:  several months. Context:  grandchildren moving back .  Past Psychiatric History: Past Medical History  Diagnosis Date  . Hypertension   . Diabetes mellitus   . Hyperlipidemia   . History of noncompliance with medical treatment   . Shingles 08/18/2013    reports that she has never smoked. She has never used smokeless tobacco. She reports that she does not drink alcohol or use illicit drugs. Family History  Problem Relation Age of Onset  . Diabetes    . Hypertension    . Hypertension Father   . Diabetes Father   . Hypertension Mother   . Diabetes Mother   . Diabetes Sister   . Hypertension Sister   . Diabetes Sister   . Hypertension Sister      Living Arrangements: Spouse/significant other;Other relatives   Abuse/Neglect [facility]) Physical Abuse: Denies Verbal Abuse: Denies Sexual Abuse: Denies Allergies:   Allergies  Allergen Reactions  . Codeine Nausea And Vomiting  . Grapefruit Concentrate Itching  . Morphine And Related Other (See Comments)    Extreme sedation  . Peanut-Containing Drug Products Nausea And Vomiting     Objective: Blood pressure 151/82, pulse 109, temperature 100.2 F (37.9 C), temperature source Oral, resp. rate 16, height $RemoveBe'5\' 6"'eEoPMaqoJ$  (1.676 m), weight 168 lb 3.2 oz (76.295 kg), SpO2 96.00%.Body mass index is 27.16 kg/(m^2). Results for orders placed during the hospital encounter of 09/05/13 (from the past 72 hour(s))  GLUCOSE, CAPILLARY     Status: Abnormal   Collection Time    09/08/13  4:56 PM  Result Value Ref Range   Glucose-Capillary 68 (*) 70 - 99 mg/dL  GLUCOSE, CAPILLARY     Status: Abnormal   Collection Time    09/08/13  6:03 PM      Result Value Ref Range   Glucose-Capillary 112 (*) 70 - 99 mg/dL  GLUCOSE, CAPILLARY     Status: None   Collection Time    09/08/13  8:51 PM      Result Value Ref Range   Glucose-Capillary 82  70 - 99 mg/dL  BASIC METABOLIC PANEL     Status: Abnormal   Collection Time    09/09/13   4:17 AM      Result Value Ref Range   Sodium 142  137 - 147 mEq/L   Potassium 4.0  3.7 - 5.3 mEq/L   Chloride 105  96 - 112 mEq/L   CO2 27  19 - 32 mEq/L   Glucose, Bld 86  70 - 99 mg/dL   BUN 23  6 - 23 mg/dL   Creatinine, Ser 1.92 (*) 0.50 - 1.10 mg/dL   Calcium 9.3  8.4 - 10.5 mg/dL   GFR calc non Af Amer 26 (*) >90 mL/min   GFR calc Af Amer 31 (*) >90 mL/min   Comment: (NOTE)     The eGFR has been calculated using the CKD EPI equation.     This calculation has not been validated in all clinical situations.     eGFR's persistently <90 mL/min signify possible Chronic Kidney     Disease.   Anion gap 10  5 - 15  GLUCOSE, CAPILLARY     Status: Abnormal   Collection Time    09/09/13  7:47 AM      Result Value Ref Range   Glucose-Capillary 59 (*) 70 - 99 mg/dL   Comment 1 Notify RN    GLUCOSE, CAPILLARY     Status: None   Collection Time    09/09/13  8:28 AM      Result Value Ref Range   Glucose-Capillary 95  70 - 99 mg/dL  GLUCOSE, CAPILLARY     Status: Abnormal   Collection Time    09/09/13 11:49 AM      Result Value Ref Range   Glucose-Capillary 107 (*) 70 - 99 mg/dL   Comment 1 Notify RN    GLUCOSE, CAPILLARY     Status: Abnormal   Collection Time    09/09/13  4:53 PM      Result Value Ref Range   Glucose-Capillary 51 (*) 70 - 99 mg/dL   Comment 1 Notify RN    GLUCOSE, CAPILLARY     Status: Abnormal   Collection Time    09/09/13  5:24 PM      Result Value Ref Range   Glucose-Capillary 61 (*) 70 - 99 mg/dL   Comment 1 Notify RN    GLUCOSE, CAPILLARY     Status: Abnormal   Collection Time    09/09/13  6:08 PM      Result Value Ref Range   Glucose-Capillary 154 (*) 70 - 99 mg/dL   Comment 1 Notify RN    GLUCOSE, CAPILLARY     Status: None   Collection Time    09/09/13  9:38 PM      Result Value Ref Range   Glucose-Capillary 84  70 - 99 mg/dL  GLUCOSE, CAPILLARY     Status: None   Collection Time    09/10/13  7:49 AM  Result Value Ref Range    Glucose-Capillary 96  70 - 99 mg/dL  BASIC METABOLIC PANEL     Status: Abnormal   Collection Time    09/10/13  7:50 AM      Result Value Ref Range   Sodium 140  137 - 147 mEq/L   Potassium 4.2  3.7 - 5.3 mEq/L   Chloride 103  96 - 112 mEq/L   CO2 26  19 - 32 mEq/L   Glucose, Bld 97  70 - 99 mg/dL   BUN 21  6 - 23 mg/dL   Creatinine, Ser 1.74 (*) 0.50 - 1.10 mg/dL   Calcium 9.2  8.4 - 10.5 mg/dL   GFR calc non Af Amer 30 (*) >90 mL/min   GFR calc Af Amer 35 (*) >90 mL/min   Comment: (NOTE)     The eGFR has been calculated using the CKD EPI equation.     This calculation has not been validated in all clinical situations.     eGFR's persistently <90 mL/min signify possible Chronic Kidney     Disease.   Anion gap 11  5 - 15  GLUCOSE, CAPILLARY     Status: Abnormal   Collection Time    09/10/13 11:44 AM      Result Value Ref Range   Glucose-Capillary 132 (*) 70 - 99 mg/dL   Comment 1 Documented in Chart     Comment 2 Notify RN    GLUCOSE, CAPILLARY     Status: Abnormal   Collection Time    09/10/13  4:33 PM      Result Value Ref Range   Glucose-Capillary 111 (*) 70 - 99 mg/dL  GLUCOSE, CAPILLARY     Status: Abnormal   Collection Time    09/10/13  8:16 PM      Result Value Ref Range   Glucose-Capillary 115 (*) 70 - 99 mg/dL  BASIC METABOLIC PANEL     Status: Abnormal   Collection Time    09/11/13  4:56 AM      Result Value Ref Range   Sodium 138  137 - 147 mEq/L   Potassium 4.1  3.7 - 5.3 mEq/L   Chloride 104  96 - 112 mEq/L   CO2 21  19 - 32 mEq/L   Glucose, Bld 115 (*) 70 - 99 mg/dL   BUN 22  6 - 23 mg/dL   Creatinine, Ser 1.80 (*) 0.50 - 1.10 mg/dL   Calcium 8.8  8.4 - 10.5 mg/dL   GFR calc non Af Amer 29 (*) >90 mL/min   GFR calc Af Amer 33 (*) >90 mL/min   Comment: (NOTE)     The eGFR has been calculated using the CKD EPI equation.     This calculation has not been validated in all clinical situations.     eGFR's persistently <90 mL/min signify possible Chronic  Kidney     Disease.   Anion gap 13  5 - 15  GLUCOSE, CAPILLARY     Status: Abnormal   Collection Time    09/11/13  7:26 AM      Result Value Ref Range   Glucose-Capillary 107 (*) 70 - 99 mg/dL   Comment 1 Documented in Chart     Comment 2 Notify RN    GLUCOSE, CAPILLARY     Status: Abnormal   Collection Time    09/11/13 11:39 AM      Result Value Ref Range   Glucose-Capillary 177 (*) 70 -  99 mg/dL   Comment 1 Documented in Chart     Comment 2 Notify RN     Labs are reviewed and are pertinent for resolving renal failure  Current Facility-Administered Medications  Medication Dose Route Frequency Provider Last Rate Last Dose  . 0.9 %  sodium chloride infusion   Intravenous Continuous Donne Hazel, MD 100 mL/hr at 09/11/13 1307 1,000 mL at 09/11/13 1307  . acetaminophen (TYLENOL) tablet 650 mg  650 mg Oral Q6H PRN Rise Patience, MD   650 mg at 09/09/13 3546  . amitriptyline (ELAVIL) tablet 50 mg  50 mg Oral QHS Levonne Spiller, MD   50 mg at 09/10/13 2125  . aspirin EC tablet 81 mg  81 mg Oral Daily Rise Patience, MD   81 mg at 09/11/13 1049  . atorvastatin (LIPITOR) tablet 10 mg  10 mg Oral q1800 Rise Patience, MD   10 mg at 09/09/13 1735  . carvedilol (COREG) tablet 6.25 mg  6.25 mg Oral BID WC Rise Patience, MD   6.25 mg at 09/11/13 1049  . [START ON 09/12/2013] ciprofloxacin (CIPRO) tablet 750 mg  750 mg Oral Daily Donne Hazel, MD      . clonazePAM Bobbye Charleston) tablet 1 mg  1 mg Oral TID Levonne Spiller, MD   1 mg at 09/11/13 1052  . enoxaparin (LOVENOX) injection 40 mg  40 mg Subcutaneous Q24H Rise Patience, MD   40 mg at 09/11/13 1051  . glucose-Vitamin C 4-0.006 GM per chewable tablet 4 tablet  4 tablet Oral PRN Donne Hazel, MD      . insulin aspart (novoLOG) injection 0-9 Units  0-9 Units Subcutaneous TID WC Rise Patience, MD   2 Units at 09/11/13 1306  . insulin glargine (LANTUS) injection 10 Units  10 Units Subcutaneous BID Donne Hazel, MD    10 Units at 09/11/13 1051  . methocarbamol (ROBAXIN) tablet 500 mg  500 mg Oral Q8H PRN Thurnell Lose, MD   500 mg at 09/06/13 1614  . naphazoline (NAPHCON) 0.1 % ophthalmic solution 2 drop  2 drop Both Eyes QID PRN Donne Hazel, MD   2 drop at 09/09/13 1736  . ondansetron (ZOFRAN) injection 4 mg  4 mg Intravenous Q6H PRN Rise Patience, MD      . sodium chloride 0.9 % injection 3 mL  3 mL Intravenous Q12H Rise Patience, MD   3 mL at 09/10/13 0919  . sodium chloride 0.9 % injection 3 mL  3 mL Intravenous Q12H Rise Patience, MD   3 mL at 09/11/13 1052    Psychiatric Specialty Exam:     Blood pressure 151/82, pulse 109, temperature 100.2 F (37.9 C), temperature source Oral, resp. rate 16, height $RemoveBe'5\' 6"'HEGirojCs$  (1.676 m), weight 168 lb 3.2 oz (76.295 kg), SpO2 96.00%.Body mass index is 27.16 kg/(m^2).  General Appearance: Bizarre and Disheveled  Still moving but less so  Eye Contact::  Fair  Speech:  Clear and Coherent  Volume:  Normal  Mood:  Anxious  Affect:  Full Range  Thought Process: improved  Orientation:  Full (Time, Place, and Person)  Thought Content:  Rumination  Suicidal Thoughts:  No  Homicidal Thoughts:  No  Memory:  Immediate;   Fair Recent;   Fair Remote;   Fair  Judgement:  Poor  Insight:  Lacking  Psychomotor Activity: still some choreiaform movements but much decreased  Concentration:  Poor  Recall:  [place]: Fair  Akathisia:  No  Handed:  Right  AIMS (if indicated):     Assets:  Communication Skills Desire for Improvement Social Support  Sleep:   poor   Musculoskeletal: Strength & Muscle Tone: within normal limits Gait & Station: unsteady Patient leans: N/A  Treatment Plan Summary: Daily contact with patient to assess and evaluate symptoms and progress in treatment Medication management I think this presentation is consistent with conversion disorder. She has no reason for tardive dyskinesia as the meds  she was on(gabapentin and Lexapro) do not cause this.She has no history of antipsychotic use. Recommend discharge when medically clear, continue on Clonazepam and Amitriptiline at home Follow up with psychiatry at home if possible  [employer], [facility] 09/11/2013 2:07 PM

## 2013-09-12 ENCOUNTER — Inpatient Hospital Stay (HOSPITAL_COMMUNITY): Payer: BC Managed Care – PPO

## 2013-09-12 DIAGNOSIS — F411 Generalized anxiety disorder: Secondary | ICD-10-CM

## 2013-09-12 LAB — CBC WITH DIFFERENTIAL/PLATELET
Basophils Absolute: 0 10*3/uL (ref 0.0–0.1)
Basophils Relative: 0 % (ref 0–1)
Eosinophils Absolute: 0.1 10*3/uL (ref 0.0–0.7)
Eosinophils Relative: 1 % (ref 0–5)
HCT: 25 % — ABNORMAL LOW (ref 36.0–46.0)
Hemoglobin: 8 g/dL — ABNORMAL LOW (ref 12.0–15.0)
Lymphocytes Relative: 22 % (ref 12–46)
Lymphs Abs: 1.9 10*3/uL (ref 0.7–4.0)
MCH: 28.3 pg (ref 26.0–34.0)
MCHC: 32 g/dL (ref 30.0–36.0)
MCV: 88.3 fL (ref 78.0–100.0)
Monocytes Absolute: 0.8 10*3/uL (ref 0.1–1.0)
Monocytes Relative: 9 % (ref 3–12)
Neutro Abs: 5.8 10*3/uL (ref 1.7–7.7)
Neutrophils Relative %: 68 % (ref 43–77)
Platelets: 322 10*3/uL (ref 150–400)
RBC: 2.83 MIL/uL — ABNORMAL LOW (ref 3.87–5.11)
RDW: 13.8 % (ref 11.5–15.5)
WBC: 8.6 10*3/uL (ref 4.0–10.5)

## 2013-09-12 LAB — BASIC METABOLIC PANEL
Anion gap: 13 (ref 5–15)
BUN: 20 mg/dL (ref 6–23)
CO2: 20 mEq/L (ref 19–32)
Calcium: 9 mg/dL (ref 8.4–10.5)
Chloride: 106 mEq/L (ref 96–112)
Creatinine, Ser: 1.66 mg/dL — ABNORMAL HIGH (ref 0.50–1.10)
GFR calc Af Amer: 37 mL/min — ABNORMAL LOW (ref >90)
GFR calc non Af Amer: 32 mL/min — ABNORMAL LOW (ref >90)
Glucose, Bld: 148 mg/dL — ABNORMAL HIGH (ref 70–99)
Potassium: 4.3 mEq/L (ref 3.7–5.3)
Sodium: 139 mEq/L (ref 137–147)

## 2013-09-12 LAB — GLUCOSE, CAPILLARY
GLUCOSE-CAPILLARY: 68 mg/dL — AB (ref 70–99)
GLUCOSE-CAPILLARY: 135 mg/dL — AB (ref 70–99)
Glucose-Capillary: 135 mg/dL — ABNORMAL HIGH (ref 70–99)
Glucose-Capillary: 168 mg/dL — ABNORMAL HIGH (ref 70–99)
Glucose-Capillary: 100 mg/dL — ABNORMAL HIGH (ref 70–99)

## 2013-09-12 LAB — CULTURE, BLOOD (ROUTINE X 2)
CULTURE: NO GROWTH
Culture: NO GROWTH

## 2013-09-12 LAB — HEMOGLOBIN AND HEMATOCRIT, BLOOD
HCT: 28.3 % — ABNORMAL LOW (ref 36.0–46.0)
Hemoglobin: 8.8 g/dL — ABNORMAL LOW (ref 12.0–15.0)

## 2013-09-12 LAB — OCCULT BLOOD X 1 CARD TO LAB, STOOL: Fecal Occult Bld: NEGATIVE

## 2013-09-12 MED ORDER — GABAPENTIN 300 MG PO CAPS
300.0000 mg | ORAL_CAPSULE | Freq: Two times a day (BID) | ORAL | Status: DC
  Administered 2013-09-12: 300 mg via ORAL
  Filled 2013-09-12 (×6): qty 1

## 2013-09-12 MED ORDER — ESCITALOPRAM OXALATE 10 MG PO TABS
10.0000 mg | ORAL_TABLET | Freq: Every day | ORAL | Status: DC
  Administered 2013-09-12 – 2013-09-14 (×3): 10 mg via ORAL
  Filled 2013-09-12 (×3): qty 1

## 2013-09-12 MED ORDER — ESCITALOPRAM OXALATE 10 MG PO TABS: 10.0000 mg | ORAL_TABLET | Freq: Every day | ORAL | Status: DC

## 2013-09-12 MED ORDER — CLONAZEPAM 1 MG PO TABS: 1.0000 mg | ORAL_TABLET | Freq: Two times a day (BID) | ORAL | Status: AC

## 2013-09-12 MED ORDER — GABAPENTIN 600 MG PO TABS
300.0000 mg | ORAL_TABLET | Freq: Two times a day (BID) | ORAL | Status: DC
  Filled 2013-09-12 (×2): qty 0.5

## 2013-09-12 MED ORDER — FUROSEMIDE 10 MG/ML IJ SOLN
20.0000 mg | Freq: Once | INTRAMUSCULAR | Status: AC
  Administered 2013-09-12: 20 mg via INTRAVENOUS
  Filled 2013-09-12: qty 2

## 2013-09-12 MED ORDER — DOCUSATE SODIUM 100 MG PO CAPS
200.0000 mg | ORAL_CAPSULE | Freq: Every day | ORAL | Status: DC | PRN
  Administered 2013-09-12: 200 mg via ORAL
  Filled 2013-09-12: qty 2

## 2013-09-12 MED ORDER — CLONAZEPAM 1 MG PO TABS
1.0000 mg | ORAL_TABLET | Freq: Two times a day (BID) | ORAL | Status: DC
  Administered 2013-09-12 – 2013-09-13 (×2): 1 mg via ORAL
  Filled 2013-09-12 (×3): qty 1

## 2013-09-12 NOTE — Progress Notes (Signed)
[facility] PROGRESS NOTE  [name] QMV:[id number] DOB: 1950/11/22 DOA: 09/05/2013 PCP: Angelica Chessman, MD  Assessment/Plan: 1. Acutely Decompensated Chronic systolic heart failure - last EF measured during this month was 40-45%..  2. Thus far, pt is down over -6.7L 3. Wt is 78kg, dry weight of around 78kg 4. Pt now off o2 5. Pt was initially given lasix, but then was placed on hold and pt on gentle IVF as renal fx had worsened (see below) 6. ARB was placed on hold 2. Involuntary movements - Discontinued patient's Neurontin and Lexapro for now. Continued Klonopin.  1. Involuntary movements remained 2. Appreciated Neuro input 3. Recs for clonazepam and outpt f/u with movement clinic 4. Recently discussed with pt's sister at bedside. Pt has been undergoing much stress in her life over the past several years that had "messed her up" and that pt had noted improvement with psychiatry in the past 5. Psych input was appreciated. Susected conversion disorder per psychiatry 6. Pt may benefit from outpatient Psych referral 3. UTI - Patient was initially on ceftriaxone.  1. >100,000 pan-sensitive Klebsiella on urine cx 2. Initially on levaquin based on urine cx but pt still with fevers 3. Subsequently transitioned to cipro and pt had remained afebrile overnight 4. Chronic Anemia - patient's hemoglobin has further decreased. Patient has had recent anemia panel which showed ferritin of around 80.  1. Cont to follow CBC. 2. Hgb had been slowly trending down. Repeat hgb improved nearly one gm after one dose of lasix, suggesting possible hemodilution 3. Hemodynamically stable 4. Stools heme neg 5. Chronic kidney disease with mild worsening  1. Pt had initially been intentionally decreasing PO fluid intake while on lasix after admit to minimize voiding 2. Cr overall improving with gentle IVF with a peak Cr of 1.9 3. Continued with gentle IVF for now and hold ARB 6. Diabetes mellitus -  holding metformin due to renal failure. Pt noted to have bouts of hypoglycemia with her home dose of lantus. I strongly suspect medical/dietary noncompliance as pt's glucose became better controlled with just 10 units Lantus daily 7. Hypertension - Remained stable. Now holding ARB per above  Code Status: Full Family Communication: Pt in room Disposition Plan: Pending  Consultants:  Neurology  Procedures:    Antibiotics:  Rocephin 8/17>>> (indicate start date, and stop date if known)  Levaquin 8/20>>>8/23  Ciprofloxacin 8/23>>>  HPI/Subjective: No acute events noted  Objective: Filed Vitals:   09/11/13 1440 09/11/13 1902 09/11/13 2059 09/12/13 0521  BP: 164/145 118/90 128/50 127/70  Pulse: 63 114 106 104  Temp: 98.7 F (37.1 C)  98 F (36.7 C) 98.8 F (37.1 C)  TempSrc: Oral  Oral Oral  Resp: 18  18 18   Height:      Weight:    78.109 kg (172 lb 3.2 oz)  SpO2: 92%  91% 96%    Intake/Output Summary (Last 24 hours) at 09/12/13 1737 Last data filed at 09/12/13 1600  Gross per 24 hour  Intake    780 ml  Output   4350 ml  Net  -3570 ml   Filed Weights   09/09/13 0500 09/10/13 0500 09/12/13 0521  Weight: 76.3 kg (168 lb 3.4 oz) 76.295 kg (168 lb 3.2 oz) 78.109 kg (172 lb 3.2 oz)    Exam:   General:  Awake, in nad  Cardiovascular: regular, s1, s2  Respiratory: normal resp effort, no wheezing  Abdomen: soft,nondistended  Musculoskeletal: perfused, no clubbing, involuntary B UE and BLE  movements in room, sparing face   Data Reviewed: Basic Metabolic Panel:  Recent Labs Lab 09/08/13 0508 09/09/13 0417 09/10/13 0750 09/11/13 0456 09/12/13 0325  NA 137 142 140 138 139  K 3.9 4.0 4.2 4.1 4.3  CL 100 105 103 104 106  CO2 26 27 26 21 20   GLUCOSE 126* 86 97 115* 148*  BUN 24* 23 21 22 20   CREATININE 1.94* 1.92* 1.74* 1.80* 1.66*  CALCIUM 9.1 9.3 9.2 8.8 9.0   Liver Function Tests: No results found for this basename: AST, ALT, ALKPHOS, BILITOT,  PROT, ALBUMIN,  in the last 168 hours No results found for this basename: LIPASE, AMYLASE,  in the last 168 hours No results found for this basename: AMMONIA,  in the last 168 hours CBC:  Recent Labs Lab 09/05/13 1913 09/12/13 0325 09/12/13 1300  WBC 9.2 8.6  --   NEUTROABS 6.2 5.8  --   HGB 9.1* 8.0* 8.8*  HCT 28.3* 25.0* 28.3*  MCV 87.6 88.3  --   PLT 266 322  --    Cardiac Enzymes:  Recent Labs Lab 09/06/13 0035 09/06/13 0619 09/06/13 1155  TROPONINI <0.30 <0.30 <0.30   BNP (last 3 results)  Recent Labs  09/06/13 0035  PROBNP 1129.0*   CBG:  Recent Labs Lab 09/11/13 1625 09/11/13 2101 09/12/13 0738 09/12/13 1158 09/12/13 1639  GLUCAP 111* 178* 135* 168* 68*    Recent Results (from the past 240 hour(s))  URINE CULTURE     Status: None   Collection Time    09/05/13  7:55 PM      Result Value Ref Range Status   Specimen Description URINE, RANDOM   Final   Special Requests NONE   Final   Culture  Setup Time     Final   Value: 09/05/2013 23:24     Performed at [place]     Final   Value: >=100,000 COLONIES/ML     Performed at [facility]   Culture     Final   Value: KLEBSIELLA PNEUMONIAE     Performed at [facility]   Report Status 09/08/2013 FINAL   Final   Organism ID, Bacteria KLEBSIELLA PNEUMONIAE   Final  CULTURE, BLOOD (ROUTINE X 2)     Status: None   Collection Time    09/06/13  9:10 AM      Result Value Ref Range Status   Specimen Description BLOOD RIGHT ANTECUBITAL   Final   Special Requests BOTTLES DRAWN AEROBIC AND ANAEROBIC 5CC   Final   Culture  Setup Time     Final   Value: 09/06/2013 14:35     Performed at [facility]   Culture     Final   Value: NO GROWTH 5 DAYS     Performed at [facility]   Report Status 09/12/2013 FINAL   Final  CULTURE, BLOOD (ROUTINE X 2)     Status: None   Collection Time    09/06/13  9:15 AM      Result Value Ref Range Status   Specimen  Description BLOOD RIGHT HAND   Final   Special Requests BOTTLES DRAWN AEROBIC ONLY [facility]   Final   Culture  Setup Time     Final   Value: 09/06/2013 14:35     Performed at [facility]   Culture     Final   Value: NO GROWTH 5 DAYS  Performed at [facility]   Report Status 09/12/2013 FINAL   Final     Studies: Dg Chest Port 1 View  09/12/2013   CLINICAL DATA:  Shortness of breath  EXAM: PORTABLE CHEST - 1 VIEW  COMPARISON:  Chest radiograph 09/05/2013 and 08/17/2013  FINDINGS: Portable semi-erect view of the chest with the patient in apical lordotic position. Stable cardiomegaly. Pulmonary vascular congestion appears slightly decreased compared 09/05/2013. There is diffuse bilateral interstitial prominence. No visible pleural effusions. Soft tissue contours adjacent to the trachea bilaterally cause no mass effect on the trachea and were not present on recent chest radiograph of 09/05/2013. These contours likely reflect vascular prominence accentuated by lordotic technique.  IMPRESSION: Mild CHF pattern. Overall, aeration of the lungs appears slightly improved compared 09/05/2013.   Electronically Signed   By: Curlene Dolphin M.D.   On: 09/12/2013 16:36    Scheduled Meds: . aspirin EC  81 mg Oral Daily  . atorvastatin  10 mg Oral q1800  . carvedilol  6.25 mg Oral BID WC  . ciprofloxacin  750 mg Oral Daily  . clonazePAM  1 mg Oral BID  . enoxaparin (LOVENOX) injection  40 mg Subcutaneous Q24H  . escitalopram  10 mg Oral Daily  . gabapentin  300 mg Oral BID  . insulin aspart  0-9 Units Subcutaneous TID WC  . insulin glargine  10 Units Subcutaneous BID  . sodium chloride  3 mL Intravenous Q12H  . sodium chloride  3 mL Intravenous Q12H   Continuous Infusions:    Principal Problem:   Systolic CHF, acute Active Problems:   HYPERTENSION   Diabetes mellitus type 2, uncontrolled   Involuntary movements   CKD (chronic kidney disease), stage III   Anemia   UTI (lower  urinary tract infection)   CHF (congestive heart failure)  Time spent: 22min  Zebediah Beezley, [facility] Pager [phone]. If 7PM-7AM, please contact night-coverage at [email or web address], password [facility] 09/12/2013, 5:37 PM  LOS: 7 days

## 2013-09-12 NOTE — Consult Note (Signed)
[facility] Face-to-Face Psychiatry Consult   Reason for Consult:  Anxiety, involuntary movements Referring Physician:  Dr. [name] is an 63 y.o. female. Total Time spent with patient: 20 minutes  Assessment: AXIS I:  Anxiety Disorder NOS AXIS II:  Deferred AXIS III:   Past Medical History  Diagnosis Date  . Hypertension   . Diabetes mellitus   . Hyperlipidemia   . History of noncompliance with medical treatment   . Shingles 08/18/2013   AXIS IV:  other psychosocial or environmental problems AXIS V:  41-50 serious symptoms  Plan: Case discussed with Donne Hazel, MD Discontinue Amitriptyline - possible potential EPS Start neurontin 300 mg PO BID for neuropathic leg pain Change Klonopin 1 mg PO BID/anxiety Increase Lexapro 10 mg PO QD for depression No evidence of imminent risk to self or others at present.   Patient does not meet criteria for psychiatric inpatient admission. Supportive therapy provided about ongoing stressors. Appreciate psychiatric consultation and followup as clinically required. Please contact [phone] if needs further assistance  Subjective:   [name] is a 63 y.o. female patient admitted with CHF. She was just in the hospital last month with problems with ambulation. Workup has been negative including brain MRI. For the last week she has been developing involuntary ,movements in her face arms and legs. She has no history of antipsychotic use. Husband states she has no prior neurological history. 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 Pt has history of mild depression and was on Lexapro and Amitryptiline in the past. She states amitryptiline helped her sleep. She has been under stress lately as two grandchildren aged 38 and 74 moved back into her home. She physically helped moved them and got overwhelmed.The movements have been present for one week and she also claims that she "cannot see." She denies suicidal ideation, homicidal  ideation, no history of substance use. Pt seen today 09/11/13. She is still exhibiting abnormal movements but much less than yesterday. States that her mood is good.She slept well last night on amitryptiline. No psychotic symptoms  Interval history: Patient is seen for psychiatric consultation followup. Patient continued to have abnormal jerky nonrhythmic movements all over her extremities which is increased when focussed and talking about them, which does not appear to be extrapyramidal symptoms or seizure activity. Patient reported he understood this and stress secondary to her grandchildren moving into her home but does not listen to her. She does not want to put them out of the home because it is not good for them. Reportedly patient has medication changes made by the outpatient doctors. Patient requested restart her Neurontin for diabetic neuropathic pain and also requested to change her Klonopin 2 times a day instead of 3 times a day. Patient is agreed to continue her Lexapro which might needed high dose for better control of symptoms of anxiety and depression. Patient continued to and os nor suicidal, homicidal ideation, intention or plans. Patient has no evidence of psychosis. Recommend to discontinue amitriptyline which may be cause of abnormal movements.  HPI Elements:   Location:  global. Quality:  generalized. Severity:  severe. Timing:  one week. Duration:  several months. Context:  grandchildren moving back .  Past Psychiatric History: Past Medical History  Diagnosis Date  . Hypertension   . Diabetes mellitus   . Hyperlipidemia   . History of noncompliance with medical treatment   . Shingles 08/18/2013    reports that she has never smoked.  She has never used smokeless tobacco. She reports that she does not drink alcohol or use illicit drugs. Family History  Problem Relation Age of Onset  . Diabetes    . Hypertension    . Hypertension Father   . Diabetes Father   . Hypertension  Mother   . Diabetes Mother   . Diabetes Sister   . Hypertension Sister   . Diabetes Sister   . Hypertension Sister      Living Arrangements: Spouse/significant other;Other relatives   Abuse/Neglect [facility]) Physical Abuse: Denies Verbal Abuse: Denies Sexual Abuse: Denies Allergies:   Allergies  Allergen Reactions  . Codeine Nausea And Vomiting  . Grapefruit Concentrate Itching  . Morphine And Related Other (See Comments)    Extreme sedation  . Peanut-Containing Drug Products Nausea And Vomiting     Objective: Blood pressure 127/70, pulse 104, temperature 98.8 F (37.1 C), temperature source Oral, resp. rate 18, height $RemoveBe'5\' 6"'ZisjZPraI$  (1.676 m), weight 78.109 kg (172 lb 3.2 oz), SpO2 96.00%.Body mass index is 27.81 kg/(m^2). Results for orders placed during the hospital encounter of 09/05/13 (from the past 72 hour(s))  GLUCOSE, CAPILLARY     Status: Abnormal   Collection Time    09/09/13  4:53 PM      Result Value Ref Range   Glucose-Capillary 51 (*) 70 - 99 mg/dL   Comment 1 Notify RN    GLUCOSE, CAPILLARY     Status: Abnormal   Collection Time    09/09/13  5:24 PM      Result Value Ref Range   Glucose-Capillary 61 (*) 70 - 99 mg/dL   Comment 1 Notify RN    GLUCOSE, CAPILLARY     Status: Abnormal   Collection Time    09/09/13  6:08 PM      Result Value Ref Range   Glucose-Capillary 154 (*) 70 - 99 mg/dL   Comment 1 Notify RN    GLUCOSE, CAPILLARY     Status: None   Collection Time    09/09/13  9:38 PM      Result Value Ref Range   Glucose-Capillary 84  70 - 99 mg/dL  GLUCOSE, CAPILLARY     Status: None   Collection Time    09/10/13  7:49 AM      Result Value Ref Range   Glucose-Capillary 96  70 - 99 mg/dL  BASIC METABOLIC PANEL     Status: Abnormal   Collection Time    09/10/13  7:50 AM      Result Value Ref Range   Sodium 140  137 - 147 mEq/L   Potassium 4.2  3.7 - 5.3 mEq/L   Chloride 103  96 - 112 mEq/L   CO2 26  19 - 32 mEq/L   Glucose, Bld 97  70 - 99 mg/dL    BUN 21  6 - 23 mg/dL   Creatinine, Ser 1.74 (*) 0.50 - 1.10 mg/dL   Calcium 9.2  8.4 - 10.5 mg/dL   GFR calc non Af Amer 30 (*) >90 mL/min   GFR calc Af Amer 35 (*) >90 mL/min   Comment: (NOTE)     The eGFR has been calculated using the CKD EPI equation.     This calculation has not been validated in all clinical situations.     eGFR's persistently <90 mL/min signify possible Chronic Kidney     Disease.   Anion gap 11  5 - 15  GLUCOSE, CAPILLARY     Status: Abnormal  Collection Time    09/10/13 11:44 AM      Result Value Ref Range   Glucose-Capillary 132 (*) 70 - 99 mg/dL   Comment 1 Documented in Chart     Comment 2 Notify RN    GLUCOSE, CAPILLARY     Status: Abnormal   Collection Time    09/10/13  4:33 PM      Result Value Ref Range   Glucose-Capillary 111 (*) 70 - 99 mg/dL  GLUCOSE, CAPILLARY     Status: Abnormal   Collection Time    09/10/13  8:16 PM      Result Value Ref Range   Glucose-Capillary 115 (*) 70 - 99 mg/dL  BASIC METABOLIC PANEL     Status: Abnormal   Collection Time    09/11/13  4:56 AM      Result Value Ref Range   Sodium 138  137 - 147 mEq/L   Potassium 4.1  3.7 - 5.3 mEq/L   Chloride 104  96 - 112 mEq/L   CO2 21  19 - 32 mEq/L   Glucose, Bld 115 (*) 70 - 99 mg/dL   BUN 22  6 - 23 mg/dL   Creatinine, Ser 1.80 (*) 0.50 - 1.10 mg/dL   Calcium 8.8  8.4 - 10.5 mg/dL   GFR calc non Af Amer 29 (*) >90 mL/min   GFR calc Af Amer 33 (*) >90 mL/min   Comment: (NOTE)     The eGFR has been calculated using the CKD EPI equation.     This calculation has not been validated in all clinical situations.     eGFR's persistently <90 mL/min signify possible Chronic Kidney     Disease.   Anion gap 13  5 - 15  GLUCOSE, CAPILLARY     Status: Abnormal   Collection Time    09/11/13  7:26 AM      Result Value Ref Range   Glucose-Capillary 107 (*) 70 - 99 mg/dL   Comment 1 Documented in Chart     Comment 2 Notify RN    GLUCOSE, CAPILLARY     Status: Abnormal    Collection Time    09/11/13 11:39 AM      Result Value Ref Range   Glucose-Capillary 177 (*) 70 - 99 mg/dL   Comment 1 Documented in Chart     Comment 2 Notify RN    GLUCOSE, CAPILLARY     Status: Abnormal   Collection Time    09/11/13  4:25 PM      Result Value Ref Range   Glucose-Capillary 111 (*) 70 - 99 mg/dL   Comment 1 Documented in Chart     Comment 2 Notify RN    GLUCOSE, CAPILLARY     Status: Abnormal   Collection Time    09/11/13  9:01 PM      Result Value Ref Range   Glucose-Capillary 178 (*) 70 - 99 mg/dL   Comment 1 Notify RN    BASIC METABOLIC PANEL     Status: Abnormal   Collection Time    09/12/13  3:25 AM      Result Value Ref Range   Sodium 139  137 - 147 mEq/L   Potassium 4.3  3.7 - 5.3 mEq/L   Chloride 106  96 - 112 mEq/L   CO2 20  19 - 32 mEq/L   Glucose, Bld 148 (*) 70 - 99 mg/dL   BUN 20  6 - 23 mg/dL  Creatinine, Ser 1.66 (*) 0.50 - 1.10 mg/dL   Calcium 9.0  8.4 - 10.5 mg/dL   GFR calc non Af Amer 32 (*) >90 mL/min   GFR calc Af Amer 37 (*) >90 mL/min   Comment: (NOTE)     The eGFR has been calculated using the CKD EPI equation.     This calculation has not been validated in all clinical situations.     eGFR's persistently <90 mL/min signify possible Chronic Kidney     Disease.   Anion gap 13  5 - 15  CBC WITH DIFFERENTIAL     Status: Abnormal   Collection Time    09/12/13  3:25 AM      Result Value Ref Range   WBC 8.6  4.0 - 10.5 K/uL   Comment: REPEATED TO VERIFY     WHITE COUNT CONFIRMED ON SMEAR   RBC 2.83 (*) 3.87 - 5.11 MIL/uL   Hemoglobin 8.0 (*) 12.0 - 15.0 g/dL   HCT 25.0 (*) 36.0 - 46.0 %   MCV 88.3  78.0 - 100.0 fL   MCH 28.3  26.0 - 34.0 pg   MCHC 32.0  30.0 - 36.0 g/dL   RDW 13.8  11.5 - 15.5 %   Platelets 322  150 - 400 K/uL   Neutrophils Relative % 68  43 - 77 %   Lymphocytes Relative 22  12 - 46 %   Monocytes Relative 9  3 - 12 %   Eosinophils Relative 1  0 - 5 %   Basophils Relative 0  0 - 1 %   Neutro Abs 5.8  1.7 -  7.7 K/uL   Lymphs Abs 1.9  0.7 - 4.0 K/uL   Monocytes Absolute 0.8  0.1 - 1.0 K/uL   Eosinophils Absolute 0.1  0.0 - 0.7 K/uL   Basophils Absolute 0.0  0.0 - 0.1 K/uL  GLUCOSE, CAPILLARY     Status: Abnormal   Collection Time    09/12/13  7:38 AM      Result Value Ref Range   Glucose-Capillary 135 (*) 70 - 99 mg/dL   Comment 1 Notify RN    GLUCOSE, CAPILLARY     Status: Abnormal   Collection Time    09/12/13 11:58 AM      Result Value Ref Range   Glucose-Capillary 168 (*) 70 - 99 mg/dL   Labs are reviewed.  Current Facility-Administered Medications  Medication Dose Route Frequency Provider Last Rate Last Dose  . acetaminophen (TYLENOL) tablet 650 mg  650 mg Oral Q6H PRN Rise Patience, MD   650 mg at 09/09/13 0272  . amitriptyline (ELAVIL) tablet 50 mg  50 mg Oral QHS Levonne Spiller, MD   50 mg at 09/11/13 2141  . aspirin EC tablet 81 mg  81 mg Oral Daily Rise Patience, MD   81 mg at 09/12/13 1023  . atorvastatin (LIPITOR) tablet 10 mg  10 mg Oral q1800 Rise Patience, MD   10 mg at 09/11/13 1901  . carvedilol (COREG) tablet 6.25 mg  6.25 mg Oral BID WC Rise Patience, MD   6.25 mg at 09/12/13 0820  . ciprofloxacin (CIPRO) tablet 750 mg  750 mg Oral Daily Donne Hazel, MD   750 mg at 09/12/13 1023  . clonazePAM (KLONOPIN) tablet 1 mg  1 mg Oral TID Levonne Spiller, MD   1 mg at 09/11/13 2141  . enoxaparin (LOVENOX) injection 40 mg  40 mg Subcutaneous Q24H Arshad  Aida Puffer, MD   40 mg at 09/11/13 1051  . glucose-Vitamin C 4-0.006 GM per chewable tablet 4 tablet  4 tablet Oral PRN Donne Hazel, MD      . insulin aspart (novoLOG) injection 0-9 Units  0-9 Units Subcutaneous TID WC Rise Patience, MD   1 Units at 09/12/13 0820  . insulin glargine (LANTUS) injection 10 Units  10 Units Subcutaneous BID Donne Hazel, MD   10 Units at 09/12/13 1024  . methocarbamol (ROBAXIN) tablet 500 mg  500 mg Oral Q8H PRN Thurnell Lose, MD   500 mg at 09/06/13 1614  .  naphazoline (NAPHCON) 0.1 % ophthalmic solution 2 drop  2 drop Both Eyes QID PRN Donne Hazel, MD   2 drop at 09/09/13 1736  . ondansetron (ZOFRAN) injection 4 mg  4 mg Intravenous Q6H PRN Rise Patience, MD      . sodium chloride 0.9 % injection 3 mL  3 mL Intravenous Q12H Rise Patience, MD   3 mL at 09/11/13 2141  . sodium chloride 0.9 % injection 3 mL  3 mL Intravenous Q12H Rise Patience, MD   3 mL at 09/11/13 1052    Psychiatric Specialty Exam:     Blood pressure 127/70, pulse 104, temperature 98.8 F (37.1 C), temperature source Oral, resp. rate 18, height $RemoveBe'5\' 6"'ByzpwZdHf$  (1.676 m), weight 78.109 kg (172 lb 3.2 oz), SpO2 96.00%.Body mass index is 27.81 kg/(m^2).  General Appearance: Bizarre and Disheveled  Still moving but less so  Eye Contact::  Fair  Speech:  Clear and Coherent  Volume:  Normal  Mood:  Anxious  Affect:  Full Range  Thought Process: improved  Orientation:  Full (Time, Place, and Person)  Thought Content:  Rumination  Suicidal Thoughts:  No  Homicidal Thoughts:  No  Memory:  Immediate;   Fair Recent;   Fair Remote;   Fair  Judgement:  Poor  Insight:  Lacking  Psychomotor Activity: still some choreiaform movements but much decreased  Concentration:  Poor  Recall:  [employer] of Knowledge:Fair  Language: Fair  Akathisia:  No  Handed:  Right  AIMS (if indicated):     Assets:  Communication Skills Desire for Improvement Social Support  Sleep:   poor   Musculoskeletal: Strength & Muscle Tone: within normal limits Gait & Station: unsteady Patient leans: N/A  Treatment Plan Summary: Daily contact with patient to assess and evaluate symptoms and progress in treatment Medication management Discontinue Amitriptyline - possible potential EPS Start neurontin 300 mg PO BID for neuropathic leg pain Change Klonopin 1 mg PO BID/anxiety Increase Lexapro 10 mg PO QD for depression Patient will be referred to the outpatient psychiatric services when she  is medically stable.  Psychiatric consultation follow up with the patient has clinically required.  [name],JANARDHAHA R. 09/12/2013 12:21 PM

## 2013-09-13 LAB — BASIC METABOLIC PANEL
ANION GAP: 10 (ref 5–15)
BUN: 21 mg/dL (ref 6–23)
CALCIUM: 9.2 mg/dL (ref 8.4–10.5)
CHLORIDE: 106 meq/L (ref 96–112)
CO2: 24 meq/L (ref 19–32)
CREATININE: 1.71 mg/dL — AB (ref 0.50–1.10)
GFR calc Af Amer: 35 mL/min — ABNORMAL LOW (ref >90)
GFR calc non Af Amer: 30 mL/min — ABNORMAL LOW (ref >90)
GLUCOSE: 138 mg/dL — AB (ref 70–99)
Potassium: 4.3 mEq/L (ref 3.7–5.3)
Sodium: 140 mEq/L (ref 137–147)

## 2013-09-13 LAB — GLUCOSE, CAPILLARY
Glucose-Capillary: 125 mg/dL — ABNORMAL HIGH (ref 70–99)
Glucose-Capillary: 120 mg/dL — ABNORMAL HIGH (ref 70–99)
Glucose-Capillary: 189 mg/dL — ABNORMAL HIGH (ref 70–99)
Glucose-Capillary: 101 mg/dL — ABNORMAL HIGH (ref 70–99)

## 2013-09-13 MED ORDER — BENZTROPINE MESYLATE 0.5 MG PO TABS
0.5000 mg | ORAL_TABLET | Freq: Two times a day (BID) | ORAL | Status: DC
  Administered 2013-09-13 – 2013-09-14 (×2): 0.5 mg via ORAL
  Filled 2013-09-13 (×3): qty 1

## 2013-09-13 MED ORDER — FUROSEMIDE 10 MG/ML IJ SOLN
40.0000 mg | Freq: Every day | INTRAMUSCULAR | Status: DC
  Administered 2013-09-13 – 2013-09-14 (×2): 40 mg via INTRAVENOUS
  Filled 2013-09-13 (×2): qty 4

## 2013-09-13 MED ORDER — INSULIN GLARGINE 100 UNIT/ML ~~LOC~~ SOLN
8.0000 [IU] | Freq: Two times a day (BID) | SUBCUTANEOUS | Status: DC
  Administered 2013-09-13 – 2013-09-14 (×2): 8 [IU] via SUBCUTANEOUS
  Filled 2013-09-13 (×3): qty 0.08

## 2013-09-13 MED ORDER — POLYETHYLENE GLYCOL 3350 17 G PO PACK
17.0000 g | PACK | Freq: Every day | ORAL | Status: DC
  Administered 2013-09-13: 17 g via ORAL
  Filled 2013-09-13 (×2): qty 1

## 2013-09-13 MED ORDER — BISACODYL 10 MG RE SUPP: 10.0000 mg | Freq: Every day | RECTAL | Status: DC | PRN

## 2013-09-13 NOTE — Clinical Documentation Improvement (Signed)
  Patient with CKD stage IV. Per H&P "Chronic kidney disease with mild worsening" and "hold metformin due to renal failure". On 8/1 Cr 1.29, GFR 43/50, 8/17 1.57 and 34/39, 8/23 1.80 and 29/33. Please address in Notes.  Possible Clinical Conditions?  - AKI - Acute Renal Failure - Acute on Chronic renal failure  - Chronic Renal Failure  Thank You, Ezekiel Ina ,RN Clinical Documentation Specialist:  [phone]  [facility] [place] Information Management

## 2013-09-13 NOTE — Progress Notes (Signed)
UR Completed Graceyn Fodor Graves-Bigelow, RN,BSN [phone]  

## 2013-09-13 NOTE — Progress Notes (Addendum)
[facility] PROGRESS NOTE  DALIYAH SRAMEK XBD:[id number] DOB: 12-25-1950 DOA: 09/05/2013 PCP: Angelica Chessman, MD Off Service Summary: [id number] with hx of chronic systolic chf who presents with sob and volume overload. She was diuresed and weaned off of O2. Unfortunately, the patient admitted to not drinking while being diuresed, resulting in acute renal failure. She was gently hydrated with some improvement in renal function. A repeat cxr continues to demonstrate CHF. Lasix has been resumed. Anticipate discharge in 1-2 days  Pt also found to have pan sensitive Klebsiella initially treated with levaquin based on sensitivities. She continued to have intermittent fevers, thus was transitioned to cipro. Thus far, pt seems to be tolerating abx with anticipated stop date through 8/29..  Of note, the patient also presented with involuntary movements that have now been determined to be secondary to conversion disorder. Neurology recommends follow up with movement clinic and scheduled clonazepam.   Assessment/Plan: 1. Acutely Decompensated Chronic systolic heart failure - last EF measured during this month was 40-45%..  2. Wt is 78kg, dry weight of around 78kg 3. Pt now off o2 4. Pt was initially given lasix, but then was placed on hold and pt on gentle IVF as renal fx had worsened (see below) 5. ARB was placed on hold 6. Pt briefly required O2 earlier this AM, but currently on RA 7. Repeat CXR yesterday with CHF, albeit improved 8. Resume lasix 2. Involuntary movements - Discontinued patient's Neurontin and Lexapro for now. Continued Klonopin.  1. Involuntary movements remained 2. Appreciated Neuro input 3. Recs for clonazepam and outpt f/u with movement clinic 4. Recently discussed with pt's sister at bedside. Pt has been undergoing much stress in her life over the past several years that had "messed her up" and that pt had noted improvement with psychiatry in the past 5. Psych input was  appreciated. Susected conversion disorder per psychiatry 6. Pt may benefit from outpatient Psych referral 3. UTI - Patient was initially on ceftriaxone.  1. >100,000 pan-sensitive Klebsiella on urine cx 2. Initially on levaquin based on urine cx but pt still with fevers 3. Subsequently transitioned to cipro and pt had remained afebrile overnight 4. Chronic Anemia - patient's hemoglobin has further decreased. Patient has had recent anemia panel which showed ferritin of around 80.  1. Cont to follow CBC. 2. Hgb had been slowly trending down. Repeat hgb improved nearly one gm after one dose of lasix, suggesting possible hemodilution 3. Hemodynamically stable 4. Stools heme neg 5. Chronic kidney disease with mild worsening  1. Pt had initially been intentionally decreasing PO fluid intake while on lasix after admit to minimize voiding 2. Cr overall improving with gentle IVF with a peak Cr of 1.9 3. Continued with gentle IVF for now and hold ARB 6. Diabetes mellitus - holding metformin due to renal failure. Pt noted to have bouts of hypoglycemia with her home dose of lantus. I strongly suspect medical/dietary noncompliance as pt's glucose became better controlled with just 10 units Lantus daily-still with bouts of hypoglycemia, so decrease to 8 units 7. Hypertension - Remained stable. Now holding ARB per above  Code Status: Full Family Communication: Pt in room Disposition Plan: Pending  Consultants:  Neurology  Procedures:    Antibiotics:  Rocephin 8/17>>> (indicate start date, and stop date if known)  Levaquin 8/20>>>8/23  Ciprofloxacin 8/23>>>Anticipate through 8/29  HPI/Subjective: Pt eager to go home.  Objective: Filed Vitals:   09/12/13 9622 09/12/13 2144 09/12/13 2145 09/13/13 0607  BP: 127/70  140/66 124/75  Pulse: 104  134 117  Temp: 98.8 F (37.1 C)  100.1 F (37.8 C) 98.7 F (37.1 C)  TempSrc: Oral  Oral Oral  Resp: 18  18 18   Height:      Weight: 78.109 kg  (172 lb 3.2 oz)   76.93 kg (169 lb 9.6 oz)  SpO2: 96% 86% 91% 100%    Intake/Output Summary (Last 24 hours) at 09/13/13 1140 Last data filed at 09/13/13 9326  Gross per 24 hour  Intake    390 ml  Output   4100 ml  Net  -3710 ml   Filed Weights   09/10/13 0500 09/12/13 0521 09/13/13 0607  Weight: 76.295 kg (168 lb 3.2 oz) 78.109 kg (172 lb 3.2 oz) 76.93 kg (169 lb 9.6 oz)    Exam:   General:  Awake, in nad  Cardiovascular: regular, s1, s2  Respiratory: normal resp effort, no wheezing  Abdomen: soft,nondistended  Musculoskeletal: perfused, no clubbing, involuntary B UE and BLE movements in room, sparing face   Data Reviewed: Basic Metabolic Panel:  Recent Labs Lab 09/09/13 0417 09/10/13 0750 09/11/13 0456 09/12/13 0325 09/13/13 0530  NA 142 140 138 139 140  K 4.0 4.2 4.1 4.3 4.3  CL 105 103 104 106 106  CO2 27 26 21 20 24   GLUCOSE 86 97 115* 148* 138*  BUN 23 21 22 20 21   CREATININE 1.92* 1.74* 1.80* 1.66* 1.71*  CALCIUM 9.3 9.2 8.8 9.0 9.2   Liver Function Tests: No results found for this basename: AST, ALT, ALKPHOS, BILITOT, PROT, ALBUMIN,  in the last 168 hours No results found for this basename: LIPASE, AMYLASE,  in the last 168 hours No results found for this basename: AMMONIA,  in the last 168 hours CBC:  Recent Labs Lab 09/12/13 0325 09/12/13 1300  WBC 8.6  --   NEUTROABS 5.8  --   HGB 8.0* 8.8*  HCT 25.0* 28.3*  MCV 88.3  --   PLT 322  --    Cardiac Enzymes:  Recent Labs Lab 09/06/13 1155  TROPONINI <0.30   BNP (last 3 results)  Recent Labs  09/06/13 0035  PROBNP 1129.0*   CBG:  Recent Labs Lab 09/12/13 1639 09/12/13 1727 09/12/13 2148 09/13/13 0733 09/13/13 1112  GLUCAP 68* 100* 135* 125* 120*    Recent Results (from the past 240 hour(s))  URINE CULTURE     Status: None   Collection Time    09/05/13  7:55 PM      Result Value Ref Range Status   Specimen Description URINE, RANDOM   Final   Special Requests NONE    Final   Culture  Setup Time     Final   Value: 09/05/2013 23:24     Performed at [place]     Final   Value: >=100,000 COLONIES/ML     Performed at [facility]   Culture     Final   Value: KLEBSIELLA PNEUMONIAE     Performed at [facility]   Report Status 09/08/2013 FINAL   Final   Organism ID, Bacteria KLEBSIELLA PNEUMONIAE   Final  CULTURE, BLOOD (ROUTINE X 2)     Status: None   Collection Time    09/06/13  9:10 AM      Result Value Ref Range Status   Specimen Description BLOOD RIGHT ANTECUBITAL   Final   Special Requests BOTTLES DRAWN AEROBIC AND ANAEROBIC 5CC   Final  Culture  Setup Time     Final   Value: 09/06/2013 14:35     Performed at [facility]   Culture     Final   Value: NO GROWTH 5 DAYS     Performed at [facility]   Report Status 09/12/2013 FINAL   Final  CULTURE, BLOOD (ROUTINE X 2)     Status: None   Collection Time    09/06/13  9:15 AM      Result Value Ref Range Status   Specimen Description BLOOD RIGHT HAND   Final   Special Requests BOTTLES DRAWN AEROBIC ONLY [facility]   Final   Culture  Setup Time     Final   Value: 09/06/2013 14:35     Performed at [facility]   Culture     Final   Value: NO GROWTH 5 DAYS     Performed at [facility]   Report Status 09/12/2013 FINAL   Final     Studies: Dg Chest Port 1 View  09/12/2013   CLINICAL DATA:  Shortness of breath  EXAM: PORTABLE CHEST - 1 VIEW  COMPARISON:  Chest radiograph 09/05/2013 and 08/17/2013  FINDINGS: Portable semi-erect view of the chest with the patient in apical lordotic position. Stable cardiomegaly. Pulmonary vascular congestion appears slightly decreased compared 09/05/2013. There is diffuse bilateral interstitial prominence. No visible pleural effusions. Soft tissue contours adjacent to the trachea bilaterally cause no mass effect on the trachea and were not present on recent chest radiograph of 09/05/2013. These  contours likely reflect vascular prominence accentuated by lordotic technique.  IMPRESSION: Mild CHF pattern. Overall, aeration of the lungs appears slightly improved compared 09/05/2013.   Electronically Signed   By: Curlene Dolphin M.D.   On: 09/12/2013 16:36    Scheduled Meds: . aspirin EC  81 mg Oral Daily  . atorvastatin  10 mg Oral q1800  . carvedilol  6.25 mg Oral BID WC  . ciprofloxacin  750 mg Oral Daily  . clonazePAM  1 mg Oral BID  . enoxaparin (LOVENOX) injection  40 mg Subcutaneous Q24H  . escitalopram  10 mg Oral Daily  . furosemide  40 mg Intravenous Daily  . gabapentin  300 mg Oral BID  . insulin aspart  0-9 Units Subcutaneous TID WC  . insulin glargine  8 Units Subcutaneous BID  . sodium chloride  3 mL Intravenous Q12H  . sodium chloride  3 mL Intravenous Q12H   Continuous Infusions:    Principal Problem:   Systolic CHF, acute Active Problems:   HYPERTENSION   Diabetes mellitus type 2, uncontrolled   Involuntary movements   CKD (chronic kidney disease), stage III   Anemia   UTI (lower urinary tract infection)   CHF (congestive heart failure)  Time spent: 66min  [facility], [facility] Pager [phone]. If 7PM-7AM, please contact night-coverage at [email or web address], password [facility] 09/13/2013, 11:40 AM  LOS: 8 days

## 2013-09-13 NOTE — Progress Notes (Addendum)
Physical Therapy Treatment Patient Details Name: [name] MRN: [id number] DOB: [birth year] Today's Date: 09/13/2013    History of Present Illness [name] is a 63 y.o. female with history of hypertension and diabetes mellitus who has not been very compliant with her medications presents with complaints of weakness of the lower extremity. CT and MRI (-), UTI    PT Comments    Pt was up bedside with odd posture today, flexed over to floor and then up.  Pt was insistent she crawls up stairs with hands and feet, not knees.  Stands to descend stairs, family did not contradict this information.  Pt has many family members but has limited recall of instructions to get assistance to do mobility, cued by family.  Follow Up Recommendations  Home health PT;Supervision/Assistance - 24 hour     Equipment Recommendations  None recommended by PT    Recommendations for Other Services Other (comment) (HHPT)     Precautions / Restrictions Precautions Precautions: Fall Restrictions Weight Bearing Restrictions: No    Mobility  Bed Mobility Overal bed mobility: Modified Independent                Transfers Overall transfer level: Modified independent Equipment used: Rolling walker (2 wheeled) Transfers: Sit to/from [facility] Sit to Stand: Min guard Stand pivot transfers: Min guard       General transfer comment: Pt using RW with athetoid movements in upper body with inconsistent presentation   Ambulation/Gait Ambulation/Gait assistance: Supervision Ambulation Distance (Feet): 200 Feet Assistive device: Rolling walker (2 wheeled) Gait Pattern/deviations: Step-through pattern;Ataxic;Drifts right/left;Narrow base of support Gait velocity: slower Gait velocity interpretation: Below normal speed for age/gender General Gait Details: Drifts toward the obstacles in her pathway and encouraged her to stay to center of hall. Pt semi compliant with the  instructions   Stairs Stairs: Yes Stairs assistance: Min assist Stair Management: One rail Right;Step to pattern;Alternating pattern;Forwards Number of Stairs: 12 General stair comments: Pt insisted she uses her hands on the stairs to ascend them, but uses rail to descend.  States she is a cane walker and has the rail to assist in the house.    Wheelchair Mobility    Modified Rankin (Stroke Patients Only)       Balance Overall balance assessment: Modified Independent                                  Cognition Arousal/Alertness: Awake/alert Behavior During Therapy: WFL for tasks assessed/performed Overall Cognitive Status: Within Functional Limits for tasks assessed                      Exercises      General Comments        Pertinent Vitals/Pain Pain Assessment: 0-10 Pain Score: 6  Pain Location: legs at calves Pain Descriptors / Indicators: Sore;Other (Comment) (skin hurts) Pain Intervention(s): Other (comment) (Nsg notified) BP was 124/75, pulse 117 and O2 sat 100 per nsg notes.    Home Living                      Prior Function            PT Goals (current goals can now be found in the care plan section) Acute Rehab PT Goals Patient Stated Goal: to go home Progress towards PT goals: Progressing toward goals    Frequency  Min  3X/week    PT Plan Current plan remains appropriate    Co-evaluation             End of Session Equipment Utilized During Treatment: Gait belt Activity Tolerance: Patient tolerated treatment well Patient left: in chair;with call bell/phone within reach;with family/visitor present     Time: 0630-1601 PT Time Calculation (min): 27 min  Charges:  $Gait Training: 8-22 mins $Therapeutic Activity: 8-22 mins                    G Codes:      Ramond Dial 09/30/13, 10:45 AM  Mee Hives, PT MS Acute Rehab Dept. Number: [phone]

## 2013-09-13 NOTE — Consult Note (Signed)
[name] Face-to-Face Psychiatry Consult   Reason for Consult:  Anxiety, involuntary movements Referring Physician:  Dr. Jethro Poling is an 63 y.o. female. Total Time spent with patient: 20 minutes  Assessment: AXIS I:  Anxiety Disorder NOS AXIS II:  Deferred AXIS III:   Past Medical History  Diagnosis Date  . Hypertension   . Diabetes mellitus   . Hyperlipidemia   . History of noncompliance with medical treatment   . Shingles 08/18/2013   AXIS IV:  other psychosocial or environmental problems AXIS V:  41-50 serious symptoms  Plan: Case discussed with Jerald Kief, MD Trial of cogentin 0.5 mg BID - possible potential EPS Continue neurontin 300 mg PO BID for neuropathic leg pain Continue Klonopin 1 mg PO BID/anxiety continue Lexapro 10 mg PO QD for depression No evidence of imminent risk to self or others at present.   Patient does not meet criteria for psychiatric inpatient admission. Supportive therapy provided about ongoing stressors. Appreciate psychiatric consultation and followup as clinically required. Please contact [phone] if needs further assistance  Subjective:   [name] is a 63 y.o. female patient admitted with CHF. She was just in the hospital last month with problems with ambulation. Workup has been negative including brain MRI. For the last week she has been developing involuntary ,movements in her face arms and legs. She has no history of antipsychotic use. Husband states she has no prior neurological history. 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 Pt has history of mild depression and was on Lexapro and Amitryptiline in the past. She states amitryptiline helped her sleep. She has been under stress lately as two grandchildren aged 23 and 22 moved back into her home. She physically helped moved them and got overwhelmed.The movements have been present for one week and she also claims that she "cannot see." She denies suicidal ideation,  homicidal ideation, no history of substance use. Pt seen today 09/11/13. She is still exhibiting abnormal movements but much less than yesterday. States that her mood is good.She slept well last night on amitryptiline. No psychotic symptoms  Interval history: Patient is seen for psychiatric consultation followup and patient 2 sisters who are at bedside. Patient reportedly feeding much calm and slept better last night. Patient continued to have questions about need of cholesterol medication and diabetic medication. Patient continued to report she does not want to take medication 3 times a day. Patient has abnormal jerky nonrhythmic movements all over her extremities which is increased when focussed and talking about them, which does not appear to be extrapyramidal symptoms or seizure activity.   Patient reported she understood stress secondary to her grandchildren moving into her home but does not listen to her. She does not want to put them out of the home because it is not good for them. Patient has been compliant with psychiatric medication for depression anxiety and also neuropathic pain. Patient continue to endorses no suicidal, homicidal ideation, intention or plans. Patient has no evidence of psychosis. Will provide benztropine 0.5 mg twice daily abnormal movements.   HPI Elements:   Location:  global. Quality:  generalized. Severity:  severe. Timing:  one week. Duration:  several months. Context:  grandchildren moving back .  Past Psychiatric History: Past Medical History  Diagnosis Date  . Hypertension   . Diabetes mellitus   . Hyperlipidemia   . History of noncompliance with medical treatment   . Shingles 08/18/2013    reports that she  has never smoked. She has never used smokeless tobacco. She reports that she does not drink alcohol or use illicit drugs. Family History  Problem Relation Age of Onset  . Diabetes    . Hypertension    . Hypertension Father   . Diabetes Father   .  Hypertension Mother   . Diabetes Mother   . Diabetes Sister   . Hypertension Sister   . Diabetes Sister   . Hypertension Sister      Living Arrangements: Spouse/significant other;Other relatives   Abuse/Neglect [facility]) Physical Abuse: Denies Verbal Abuse: Denies Sexual Abuse: Denies Allergies:   Allergies  Allergen Reactions  . Codeine Nausea And Vomiting  . Grapefruit Concentrate Itching  . Morphine And Related Other (See Comments)    Extreme sedation  . Peanut-Containing Drug Products Nausea And Vomiting     Objective: Blood pressure 130/67, pulse 110, temperature 98.7 F (37.1 C), temperature source Oral, resp. rate 18, height $RemoveBe'5\' 6"'ttsFPemhb$  (1.676 m), weight 76.93 kg (169 lb 9.6 oz), SpO2 97.00%.Body mass index is 27.39 kg/(m^2). Results for orders placed during the hospital encounter of 09/05/13 (from the past 72 hour(s))  GLUCOSE, CAPILLARY     Status: Abnormal   Collection Time    09/10/13  8:16 PM      Result Value Ref Range   Glucose-Capillary 115 (*) 70 - 99 mg/dL  BASIC METABOLIC PANEL     Status: Abnormal   Collection Time    09/11/13  4:56 AM      Result Value Ref Range   Sodium 138  137 - 147 mEq/L   Potassium 4.1  3.7 - 5.3 mEq/L   Chloride 104  96 - 112 mEq/L   CO2 21  19 - 32 mEq/L   Glucose, Bld 115 (*) 70 - 99 mg/dL   BUN 22  6 - 23 mg/dL   Creatinine, Ser 1.80 (*) 0.50 - 1.10 mg/dL   Calcium 8.8  8.4 - 10.5 mg/dL   GFR calc non Af Amer 29 (*) >90 mL/min   GFR calc Af Amer 33 (*) >90 mL/min   Comment: (NOTE)     The eGFR has been calculated using the CKD EPI equation.     This calculation has not been validated in all clinical situations.     eGFR's persistently <90 mL/min signify possible Chronic Kidney     Disease.   Anion gap 13  5 - 15  GLUCOSE, CAPILLARY     Status: Abnormal   Collection Time    09/11/13  7:26 AM      Result Value Ref Range   Glucose-Capillary 107 (*) 70 - 99 mg/dL   Comment 1 Documented in Chart     Comment 2 Notify RN     GLUCOSE, CAPILLARY     Status: Abnormal   Collection Time    09/11/13 11:39 AM      Result Value Ref Range   Glucose-Capillary 177 (*) 70 - 99 mg/dL   Comment 1 Documented in Chart     Comment 2 Notify RN    GLUCOSE, CAPILLARY     Status: Abnormal   Collection Time    09/11/13  4:25 PM      Result Value Ref Range   Glucose-Capillary 111 (*) 70 - 99 mg/dL   Comment 1 Documented in Chart     Comment 2 Notify RN    GLUCOSE, CAPILLARY     Status: Abnormal   Collection Time    09/11/13  9:01 PM      Result Value Ref Range   Glucose-Capillary 178 (*) 70 - 99 mg/dL   Comment 1 Notify RN    BASIC METABOLIC PANEL     Status: Abnormal   Collection Time    09/12/13  3:25 AM      Result Value Ref Range   Sodium 139  137 - 147 mEq/L   Potassium 4.3  3.7 - 5.3 mEq/L   Chloride 106  96 - 112 mEq/L   CO2 20  19 - 32 mEq/L   Glucose, Bld 148 (*) 70 - 99 mg/dL   BUN 20  6 - 23 mg/dL   Creatinine, Ser 1.66 (*) 0.50 - 1.10 mg/dL   Calcium 9.0  8.4 - 10.5 mg/dL   GFR calc non Af Amer 32 (*) >90 mL/min   GFR calc Af Amer 37 (*) >90 mL/min   Comment: (NOTE)     The eGFR has been calculated using the CKD EPI equation.     This calculation has not been validated in all clinical situations.     eGFR's persistently <90 mL/min signify possible Chronic Kidney     Disease.   Anion gap 13  5 - 15  CBC WITH DIFFERENTIAL     Status: Abnormal   Collection Time    09/12/13  3:25 AM      Result Value Ref Range   WBC 8.6  4.0 - 10.5 K/uL   Comment: REPEATED TO VERIFY     WHITE COUNT CONFIRMED ON SMEAR   RBC 2.83 (*) 3.87 - 5.11 MIL/uL   Hemoglobin 8.0 (*) 12.0 - 15.0 g/dL   HCT 25.0 (*) 36.0 - 46.0 %   MCV 88.3  78.0 - 100.0 fL   MCH 28.3  26.0 - 34.0 pg   MCHC 32.0  30.0 - 36.0 g/dL   RDW 13.8  11.5 - 15.5 %   Platelets 322  150 - 400 K/uL   Neutrophils Relative % 68  43 - 77 %   Lymphocytes Relative 22  12 - 46 %   Monocytes Relative 9  3 - 12 %   Eosinophils Relative 1  0 - 5 %   Basophils  Relative 0  0 - 1 %   Neutro Abs 5.8  1.7 - 7.7 K/uL   Lymphs Abs 1.9  0.7 - 4.0 K/uL   Monocytes Absolute 0.8  0.1 - 1.0 K/uL   Eosinophils Absolute 0.1  0.0 - 0.7 K/uL   Basophils Absolute 0.0  0.0 - 0.1 K/uL  GLUCOSE, CAPILLARY     Status: Abnormal   Collection Time    09/12/13  7:38 AM      Result Value Ref Range   Glucose-Capillary 135 (*) 70 - 99 mg/dL   Comment 1 Notify RN    GLUCOSE, CAPILLARY     Status: Abnormal   Collection Time    09/12/13 11:58 AM      Result Value Ref Range   Glucose-Capillary 168 (*) 70 - 99 mg/dL  HEMOGLOBIN AND HEMATOCRIT, BLOOD     Status: Abnormal   Collection Time    09/12/13  1:00 PM      Result Value Ref Range   Hemoglobin 8.8 (*) 12.0 - 15.0 g/dL   HCT 28.3 (*) 36.0 - 46.0 %  OCCULT BLOOD X 1 CARD TO LAB, STOOL     Status: None   Collection Time    09/12/13  2:32 PM  Result Value Ref Range   Fecal Occult Bld NEGATIVE  NEGATIVE  GLUCOSE, CAPILLARY     Status: Abnormal   Collection Time    09/12/13  4:39 PM      Result Value Ref Range   Glucose-Capillary 68 (*) 70 - 99 mg/dL  GLUCOSE, CAPILLARY     Status: Abnormal   Collection Time    09/12/13  5:27 PM      Result Value Ref Range   Glucose-Capillary 100 (*) 70 - 99 mg/dL   Comment 1 Notify RN    GLUCOSE, CAPILLARY     Status: Abnormal   Collection Time    09/12/13  9:48 PM      Result Value Ref Range   Glucose-Capillary 135 (*) 70 - 99 mg/dL  BASIC METABOLIC PANEL     Status: Abnormal   Collection Time    09/13/13  5:30 AM      Result Value Ref Range   Sodium 140  137 - 147 mEq/L   Potassium 4.3  3.7 - 5.3 mEq/L   Chloride 106  96 - 112 mEq/L   CO2 24  19 - 32 mEq/L   Glucose, Bld 138 (*) 70 - 99 mg/dL   BUN 21  6 - 23 mg/dL   Creatinine, Ser 1.71 (*) 0.50 - 1.10 mg/dL   Calcium 9.2  8.4 - 10.5 mg/dL   GFR calc non Af Amer 30 (*) >90 mL/min   GFR calc Af Amer 35 (*) >90 mL/min   Comment: (NOTE)     The eGFR has been calculated using the CKD EPI equation.     This  calculation has not been validated in all clinical situations.     eGFR's persistently <90 mL/min signify possible Chronic Kidney     Disease.   Anion gap 10  5 - 15  GLUCOSE, CAPILLARY     Status: Abnormal   Collection Time    09/13/13  7:33 AM      Result Value Ref Range   Glucose-Capillary 125 (*) 70 - 99 mg/dL  GLUCOSE, CAPILLARY     Status: Abnormal   Collection Time    09/13/13 11:12 AM      Result Value Ref Range   Glucose-Capillary 120 (*) 70 - 99 mg/dL  GLUCOSE, CAPILLARY     Status: Abnormal   Collection Time    09/13/13  4:32 PM      Result Value Ref Range   Glucose-Capillary 189 (*) 70 - 99 mg/dL   Labs are reviewed.  Current Facility-Administered Medications  Medication Dose Route Frequency Provider Last Rate Last Dose  . acetaminophen (TYLENOL) tablet 650 mg  650 mg Oral Q6H PRN Rise Patience, MD   650 mg at 09/13/13 1103  . aspirin EC tablet 81 mg  81 mg Oral Daily Rise Patience, MD   81 mg at 09/13/13 1103  . atorvastatin (LIPITOR) tablet 10 mg  10 mg Oral q1800 Rise Patience, MD   10 mg at 09/11/13 1901  . benztropine (COGENTIN) tablet 0.5 mg  0.5 mg Oral BID Durward Parcel, MD      . bisacodyl (DULCOLAX) suppository 10 mg  10 mg Rectal Daily PRN Donne Hazel, MD      . carvedilol (COREG) tablet 6.25 mg  6.25 mg Oral BID WC Rise Patience, MD   6.25 mg at 09/13/13 0846  . ciprofloxacin (CIPRO) tablet 750 mg  750 mg Oral Daily Donne Hazel,  MD   750 mg at 09/13/13 1102  . clonazePAM (KLONOPIN) tablet 1 mg  1 mg Oral BID Durward Parcel, MD   1 mg at 09/12/13 2255  . docusate sodium (COLACE) capsule 200 mg  200 mg Oral Daily PRN Gardiner Barefoot, NP   200 mg at 09/12/13 2315  . enoxaparin (LOVENOX) injection 40 mg  40 mg Subcutaneous Q24H Rise Patience, MD   40 mg at 09/13/13 1103  . escitalopram (LEXAPRO) tablet 10 mg  10 mg Oral Daily Durward Parcel, MD   10 mg at 09/13/13 1104  . furosemide  (LASIX) injection 40 mg  40 mg Intravenous Daily Donne Hazel, MD   40 mg at 09/13/13 1411  . gabapentin (NEURONTIN) capsule 300 mg  300 mg Oral BID Donne Hazel, MD   300 mg at 09/12/13 1323  . glucose-Vitamin C 4-0.006 GM per chewable tablet 4 tablet  4 tablet Oral PRN Donne Hazel, MD      . insulin aspart (novoLOG) injection 0-9 Units  0-9 Units Subcutaneous TID WC Rise Patience, MD   1 Units at 09/13/13 0845  . insulin glargine (LANTUS) injection 8 Units  8 Units Subcutaneous BID Donne Hazel, MD      . methocarbamol (ROBAXIN) tablet 500 mg  500 mg Oral Q8H PRN Thurnell Lose, MD   500 mg at 09/06/13 1614  . naphazoline (NAPHCON) 0.1 % ophthalmic solution 2 drop  2 drop Both Eyes QID PRN Donne Hazel, MD   2 drop at 09/09/13 1736  . ondansetron (ZOFRAN) injection 4 mg  4 mg Intravenous Q6H PRN Rise Patience, MD      . polyethylene glycol (MIRALAX / GLYCOLAX) packet 17 g  17 g Oral Daily Donne Hazel, MD      . sodium chloride 0.9 % injection 3 mL  3 mL Intravenous Q12H Rise Patience, MD   3 mL at 09/12/13 2255  . sodium chloride 0.9 % injection 3 mL  3 mL Intravenous Q12H Rise Patience, MD   3 mL at 09/13/13 1105    Psychiatric Specialty Exam:     Blood pressure 130/67, pulse 110, temperature 98.7 F (37.1 C), temperature source Oral, resp. rate 18, height $RemoveBe'5\' 6"'vbOfzHKsJ$  (1.676 m), weight 76.93 kg (169 lb 9.6 oz), SpO2 97.00%.Body mass index is 27.39 kg/(m^2).  General Appearance: Bizarre and Disheveled  Still moving but less so  Eye Contact::  Fair  Speech:  Clear and Coherent  Volume:  Normal  Mood:  Anxious  Affect:  Full Range  Thought Process: improved  Orientation:  Full (Time, Place, and Person)  Thought Content:  Rumination  Suicidal Thoughts:  No  Homicidal Thoughts:  No  Memory:  Immediate;   Fair Recent;   Fair Remote;   Fair  Judgement:  Poor  Insight:  Lacking  Psychomotor Activity: still some choreiaform movements but much decreased   Concentration:  Poor  Recall:  [employer] of Knowledge:Fair  Language: Fair  Akathisia:  No  Handed:  Right  AIMS (if indicated):     Assets:  Communication Skills Desire for Improvement Social Support  Sleep:   poor   Musculoskeletal: Strength & Muscle Tone: within normal limits Gait & Station: unsteady Patient leans: N/A  Treatment Plan Summary: Daily contact with patient to assess and evaluate symptoms and progress in treatment Medication management Trail of benztropine 0.5 mg twice daily Continue neurontin 300 mg PO  BID for neuropathic leg pain Continue Klonopin 1 mg PO BID/anxiety Continue Lexapro 10 mg PO QD for depression and encour [name] is a aged to be compliant with her medication Patient will be referred to the outpatient psychiatric services when she is medically stable.  Psychiatric consultation follow up with the patient has clinically required.  Vikrant Pryce,JANARDHAHA R. 09/13/2013 4:55 PM

## 2013-09-14 ENCOUNTER — Inpatient Hospital Stay (HOSPITAL_COMMUNITY): Payer: BC Managed Care – PPO

## 2013-09-14 LAB — BASIC METABOLIC PANEL
Anion gap: 14 (ref 5–15)
BUN: 22 mg/dL (ref 6–23)
CHLORIDE: 103 meq/L (ref 96–112)
CO2: 24 meq/L (ref 19–32)
Calcium: 9.2 mg/dL (ref 8.4–10.5)
Creatinine, Ser: 1.81 mg/dL — ABNORMAL HIGH (ref 0.50–1.10)
GFR calc Af Amer: 33 mL/min — ABNORMAL LOW (ref >90)
GFR calc non Af Amer: 28 mL/min — ABNORMAL LOW (ref >90)
GLUCOSE: 112 mg/dL — AB (ref 70–99)
POTASSIUM: 4.2 meq/L (ref 3.7–5.3)
Sodium: 141 mEq/L (ref 137–147)

## 2013-09-14 LAB — GLUCOSE, CAPILLARY
GLUCOSE-CAPILLARY: 117 mg/dL — AB (ref 70–99)
GLUCOSE-CAPILLARY: 105 mg/dL — AB (ref 70–99)
GLUCOSE-CAPILLARY: 80 mg/dL (ref 70–99)

## 2013-09-14 MED ORDER — BENZTROPINE MESYLATE 0.5 MG PO TABS: 0.5000 mg | ORAL_TABLET | Freq: Two times a day (BID) | ORAL | Status: AC

## 2013-09-14 MED ORDER — POLYETHYLENE GLYCOL 3350 17 G PO PACK: 17.0000 g | PACK | Freq: Every day | ORAL | Status: AC

## 2013-09-14 MED ORDER — FUROSEMIDE 20 MG PO TABS: 20.0000 mg | ORAL_TABLET | Freq: Every day | ORAL | Status: DC

## 2013-09-14 MED ORDER — INSULIN GLARGINE 100 UNIT/ML ~~LOC~~ SOLN: 8.0000 [IU] | Freq: Two times a day (BID) | SUBCUTANEOUS | Status: AC

## 2013-09-14 NOTE — Discharge Summary (Signed)
Physician Discharge Summary  [name] FVC:[id number] DOB: 1950/08/23 DOA: 09/05/2013  PCP: Angelica Chessman, MD  Admit date: 09/05/2013 Discharge date: 09/14/2013  Time spent: 35 minutes  Recommendations for Outpatient Follow-up:  1. 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 Please followup on CHF, he was diuresed during this hospitalization and discharged on Lasix 20 mg by mouth daily  Discharge Diagnoses:  Principal Problem:   Systolic CHF, acute Active Problems:   HYPERTENSION   Diabetes mellitus type 2, uncontrolled   Involuntary movements   CKD (chronic kidney disease), stage III   Anemia   UTI (lower urinary tract infection)   CHF (congestive heart failure)   Discharge Condition: Stable/improved  Diet recommendation: Heart healthy  Filed Weights   09/12/13 0521 09/13/13 0607 09/14/13 0316  Weight: 78.109 kg (172 lb 3.2 oz) 76.93 kg (169 lb 9.6 oz) 75.615 kg (166 lb 11.2 oz)    History of present illness: [name] is a 63 y.o. female with history of diabetes mellitus, hypertension, chronic kidney disease who was recently admitted to the hospital for lower extremity weakness and had MRI brain and thoracic spine which were unremarkable and patient's symptoms are felt to be secondary to neuropathy. Patient was discharged home on Neurontin and also on Lexapro and Klonopin for anxiety disorder. During the stay patient had 2-D echo which showed EF of 40-45%. Patient states since she got discharged she has been having increasing involuntary movements of her extremities. Over the last 3-4 days patient has been having increasing shortness of breath on exertion. Denies any chest pain productive cough fever chills nausea vomiting abdominal pain or diarrhea. In the ER chest x-ray shows features consistent with CHF. Patient has been admitted for further workup. On my exam patient is resting comfortably with  some abnormal involuntary movements of the upper extremities and face. She also has mild twitching of the lower extremities. Patient otherwise is well oriented to time place and person and follows commands and moves extremities but good reflexes.    Hospital Course:  [id number] with hx of chronic systolic chf who presents with sob and volume overload. She was diuresed and weaned off of O2. Unfortunately, this intervention was complicated by the development of acute on chronic renal failure. She was gently hydrated with some improvement in renal function. A repeat cxr continues to demonstrate CHF.  Patient having her last transthoracic echocardiogram performed on 08/18/2013 which showed an ejection fraction of 40-45% with hypokinesis of the inferolateral myocardium. Also noted was grade 1 diastolic dysfunction Lasix has been resumed.  Other issues addressed her this hospitalization include urinary tract infection with urine cultures growing Klebsiella. She was treated with fluoroquinolone therapy.  Patient also presented with involuntary movements are felt to be secondary to conversion disorder. During this hospitalization she was seen and evaluated by Neurology and Psychiatry. Generalized of brain performed 08/18/2013 which did not show acute intracranial abnormality. Neurology recommended follow up with movement clinic and scheduled clonazepam and cogentin. Patient discharged in stable condition on 06/14/2013 to her home.   Consultations:  Neurology  Psychiatry  Discharge Exam: Filed Vitals:   09/14/13 0316  BP: 136/79  Pulse: 55  Temp: 99.5 F (37.5 C)  Resp:     General: Patient is in no acute distress, awake and alert oriented x3 Cardiovascular: Regular rate and rhythm normal S1-S2 Respiratory: Clear to auscultation bilaterally, having a few bibasilar crackles, otherwise normal respiratory effort on room  air Abdomen: Soft nontender nondistended  Discharge Instructions You were cared for  by a hospitalist during your hospital stay. If you have any questions about your discharge medications or the care you received while you were in the hospital after you are discharged, you can call the unit and asked to speak with the hospitalist on call if the hospitalist that took care of you is not available. Once you are discharged, your primary care physician will handle any further medical issues. Please note that NO REFILLS for any discharge medications will be authorized once you are discharged, as it is imperative that you return to your primary care physician (or establish a relationship with a primary care physician if you do not have one) for your aftercare needs so that they can reassess your need for medications and monitor your lab values.  Discharge Instructions   Call MD for:  difficulty breathing, headache or visual disturbances    Complete by:  As directed      Call MD for:  extreme fatigue    Complete by:  As directed      Call MD for:  persistant dizziness or light-headedness    Complete by:  As directed      Call MD for:  persistant nausea and vomiting    Complete by:  As directed      Call MD for:  severe uncontrolled pain    Complete by:  As directed      Call MD for:  temperature >100.4    Complete by:  As directed      Diet - low sodium heart healthy    Complete by:  As directed      Increase activity slowly    Complete by:  As directed             Medication List    STOP taking these medications       metFORMIN 500 MG tablet  Commonly known as:  GLUCOPHAGE      TAKE these medications       amLODipine-olmesartan 10-40 MG per tablet  Commonly known as:  AZOR  Take 1 tablet by mouth daily.     aspirin EC 81 MG tablet  Take 1 tablet (81 mg total) by mouth daily.     atorvastatin 10 MG tablet  Commonly known as:  LIPITOR  Take 1 tablet (10 mg total) by mouth daily at 6 PM.     benztropine 0.5 MG tablet  Commonly known as:  COGENTIN  Take 1 tablet  (0.5 mg total) by mouth 2 (two) times daily.     carvedilol 6.25 MG tablet  Commonly known as:  COREG  Take 1 tablet (6.25 mg total) by mouth 2 (two) times daily with a meal.     clonazePAM 1 MG tablet  Commonly known as:  KLONOPIN  Take 1 tablet (1 mg total) by mouth 2 (two) times daily.     escitalopram 5 MG tablet  Commonly known as:  LEXAPRO  Take 5 mg by mouth at bedtime.     escitalopram 10 MG tablet  Commonly known as:  LEXAPRO  Take 1 tablet (10 mg total) by mouth daily.     furosemide 20 MG tablet  Commonly known as:  LASIX  Take 1 tablet (20 mg total) by mouth daily.     gabapentin 300 MG capsule  Commonly known as:  NEURONTIN  Take 1 capsule (300 mg total) by mouth 3 (three) times daily.  glucose monitoring kit monitoring kit  1 each by Does not apply route 4 (four) times daily - after meals and at bedtime. 1 month Diabetic Testing Supplies for QAC-QHS accuchecks.Any brand OK     insulin aspart 100 UNIT/ML injection  Commonly known as:  novoLOG  - Inject 0-10 Units into the skin 3 (three) times daily with meals. Before each meal 3 times a day, 140-199 - 2 units, 200-250 - 4 units, 251-299 - 6 units,  300-349 - 8 units,  350 or above 10 units.  - Insulin PEN if approved, provide syringes and needles if needed.     insulin glargine 100 UNIT/ML injection  Commonly known as:  LANTUS  Inject 0.08 mLs (8 Units total) into the skin 2 (two) times daily.     polyethylene glycol packet  Commonly known as:  MIRALAX / GLYCOLAX  Take 17 g by mouth daily.     VISINE OP  Place 1 drop into both eyes daily as needed (dry eyes).       Allergies  Allergen Reactions  . Codeine Nausea And Vomiting  . Grapefruit Concentrate Itching  . Morphine And Related Other (See Comments)    Extreme sedation  . Peanut-Containing Drug Products Nausea And Vomiting       Follow-up Information   Follow up with [facility]. (registered Nurse and Physical Therapy)     Contact information:   [address] [place] [address] [phone]       Follow up with Angelica Chessman, MD. Schedule an appointment as soon as possible for a visit in 1 week.   Specialty:  Internal Medicine   Contact information:   [place] [address] [phone]       Schedule an appointment as soon as possible for a visit with [facility]. (movement disorder clinic)    Contact information:   [address] [place] [address] [phone]      Follow up with Angelica Chessman, MD In 1 week.   Specialty:  Internal Medicine   Contact information:   [place] [address] [phone]        The results of significant diagnostics from this hospitalization (including imaging, microbiology, ancillary and laboratory) are listed below for reference.    Significant Diagnostic Studies: Dg Chest 2 View  09/05/2013   CLINICAL DATA:  Shortness of breath.  EXAM: CHEST  2 VIEW  COMPARISON:  08/17/2013.  FINDINGS: The heart is enlarged. Stable tortuosity and ectasia of the thoracic aorta. There is central venous congestion and moderate pulmonary edema. No definite pleural effusions. Low lung volumes with vascular crowding and bibasilar atelectasis. The bony thorax is intact.  IMPRESSION: CHF.   Electronically Signed   By: Kalman Jewels M.D.   On: 09/05/2013 22:46   Dg Chest 2 View  08/17/2013   CLINICAL DATA:  Hypertension.  EXAM: CHEST  2 VIEW  COMPARISON:  None.  FINDINGS: Mediastinum and hilar structures normal. Tiny nodular density is noted projected over the left upper lobe at the level of the anterior left second rib. To exclude a true pulmonary nodule nonenhanced chest CT is suggested. Lungs are clear of acute infiltrates. No pleural effusion or pneumothorax. Heart size and pulmonary vascularity normal. Diffuse degenerative changes thoracic spine.  IMPRESSION: Tiny nodular density projected over the left upper  lobe, this could represent a true pulmonary nodule and nonemergent nonenhanced chest CT is suggested for further evaluation  Electronically Signed   By: Marcello Moores  Register   On: 08/17/2013 16:36   Ct Head Wo Contrast  08/17/2013   CLINICAL DATA:  Facial tingling and slurred speech  EXAM: CT HEAD WITHOUT CONTRAST  TECHNIQUE: Contiguous axial images were obtained from the base of the skull through the vertex without intravenous contrast.  COMPARISON:  None.  FINDINGS: The bony calvarium is intact. The ventricles are of normal size and configuration. No findings to suggest acute hemorrhage, acute infarction or space-occupying mass lesion are noted.  IMPRESSION: No acute abnormality is noted.   Electronically Signed   By: Inez Catalina M.D.   On: 08/17/2013 20:35   Mr Brain Wo Contrast  08/18/2013   CLINICAL DATA:  Right facial numbness, upper extremity tingling, and lower extremity weakness. Stroke.  EXAM: MRI HEAD WITHOUT CONTRAST  TECHNIQUE: Multiplanar, multiecho pulse sequences of the brain and surrounding structures were obtained without intravenous contrast.  COMPARISON:  Head CT 08/17/2013  FINDINGS: There is no evidence of acute infarct, intracranial hemorrhage, mass, midline shift, or extra-axial fluid collection. Small foci of T2 hyperintensity in the subcortical and periventricular white matter are nonspecific but compatible with mild chronic small vessel ischemic disease. There is mild cerebral atrophy.  Orbits are unremarkable. Left maxillary sinus mucous retention cyst is noted. Mastoid air cells are clear. Major intracranial vascular flow voids are preserved.  IMPRESSION: 1. No acute intracranial abnormality. 2. Mild chronic small vessel ischemic disease and cerebral atrophy.   Electronically Signed   By: Logan Bores   On: 08/18/2013 20:47   Mr Thoracic Spine Wo Contrast  08/18/2013   CLINICAL DATA:  Bilateral lower extremity weakness with bladder impairment.  EXAM: MRI THORACIC SPINE WITHOUT  CONTRAST  TECHNIQUE: Multiplanar, multisequence MR imaging of the thoracic spine was performed. No intravenous contrast was administered.  COMPARISON:  Chest radiograph 08/17/2013  FINDINGS: Images are mildly degraded by motion.  There is slight upper thoracic levoscoliosis. There is no listhesis. Vertebral body heights are preserved without compression fracture. Vertebral bone marrow signal is within normal limits. The thoracic spinal cord is normal in caliber and grossly normal in signal allowing for motion. Conus medullaris terminates at L1. There is mild lower thoracic facet arthrosis, most prominently on the right at T9-10. There is mild bilateral neural foraminal narrowing at T9-10 and T10-11. No disc herniation or spinal canal stenosis is seen. There is no spinal cord compression.  There are small bilateral pleural effusions. T2 hyperintense lesions are partially visualized in the upper pole the right kidney, possibly cyst but incompletely evaluated.  IMPRESSION: 1. No thoracic spinal canal stenosis or cord compression. 2. Lower thoracic facet arthrosis with mild neural foraminal narrowing as above. 3. Small bilateral pleural effusions.   Electronically Signed   By: Logan Bores   On: 08/18/2013 20:55   Dg Chest Port 1 View  09/14/2013   CLINICAL DATA:  Shortness of breath  EXAM: PORTABLE CHEST - 1 VIEW  COMPARISON:  09/12/2013  FINDINGS: Cardiac shadow is stable. The lungs are well aerated bilaterally. The degree of vascular congestion and interstitial edema has improved from the prior exam. No focal confluent infiltrate is seen. No sizable effusion is noted.  IMPRESSION: Resolution of previously seen CHF.   Electronically Signed   By: Inez Catalina M.D.   On: 09/14/2013 07:49   Dg Chest Port 1 View  09/12/2013   CLINICAL DATA:  Shortness of breath  EXAM: PORTABLE CHEST - 1 VIEW  COMPARISON:  Chest radiograph  09/05/2013 and 08/17/2013  FINDINGS: Portable semi-erect view of the chest with the patient in  apical lordotic position. Stable cardiomegaly. Pulmonary vascular congestion appears slightly decreased compared 09/05/2013. There is diffuse bilateral interstitial prominence. No visible pleural effusions. Soft tissue contours adjacent to the trachea bilaterally cause no mass effect on the trachea and were not present on recent chest radiograph of 09/05/2013. These contours likely reflect vascular prominence accentuated by lordotic technique.  IMPRESSION: Mild CHF pattern. Overall, aeration of the lungs appears slightly improved compared 09/05/2013.   Electronically Signed   By: Curlene Dolphin M.D.   On: 09/12/2013 16:36    Microbiology: Recent Results (from the past 240 hour(s))  URINE CULTURE     Status: None   Collection Time    09/05/13  7:55 PM      Result Value Ref Range Status   Specimen Description URINE, RANDOM   Final   Special Requests NONE   Final   Culture  Setup Time     Final   Value: 09/05/2013 23:24     Performed at [facility]     Final   Value: >=100,000 COLONIES/ML     Performed at [facility]   Culture     Final   Value: KLEBSIELLA PNEUMONIAE     Performed at [facility]   Report Status 09/08/2013 FINAL   Final   Organism ID, Bacteria KLEBSIELLA PNEUMONIAE   Final  CULTURE, BLOOD (ROUTINE X 2)     Status: None   Collection Time    09/06/13  9:10 AM      Result Value Ref Range Status   Specimen Description BLOOD RIGHT ANTECUBITAL   Final   Special Requests BOTTLES DRAWN AEROBIC AND ANAEROBIC 5CC   Final   Culture  Setup Time     Final   Value: 09/06/2013 14:35     Performed at [facility]   Culture     Final   Value: NO GROWTH 5 DAYS     Performed at [facility]   Report Status 09/12/2013 FINAL   Final  CULTURE, BLOOD (ROUTINE X 2)     Status: None   Collection Time    09/06/13  9:15 AM      Result Value Ref Range Status   Specimen Description BLOOD RIGHT HAND   Final   Special Requests BOTTLES  DRAWN AEROBIC ONLY [place]   Final   Culture  Setup Time     Final   Value: 09/06/2013 14:35     Performed at [facility]   Culture     Final   Value: NO GROWTH 5 DAYS     Performed at [facility]   Report Status 09/12/2013 FINAL   Final     Labs: Basic Metabolic Panel:  Recent Labs Lab 09/10/13 0750 09/11/13 0456 09/12/13 0325 09/13/13 0530 09/14/13 0810  NA 140 138 139 140 141  K 4.2 4.1 4.3 4.3 4.2  CL 103 104 106 106 103  CO2 $Re'26 21 20 24 24  'ieJ$ GLUCOSE 97 115* 148* 138* 112*  BUN $Re'21 22 20 21 22  'Cdm$ CREATININE 1.74* 1.80* 1.66* 1.71* 1.81*  CALCIUM 9.2 8.8 9.0 9.2 9.2   Liver Function Tests: No results found for this basename: AST, ALT, ALKPHOS, BILITOT, PROT, ALBUMIN,  in the last 168 hours No results found for this basename: LIPASE, AMYLASE,  in the last 168 hours No results found for this  basename: AMMONIA,  in the last 168 hours CBC:  Recent Labs Lab 09/12/13 0325 09/12/13 1300  WBC 8.6  --   NEUTROABS 5.8  --   HGB 8.0* 8.8*  HCT 25.0* 28.3*  MCV 88.3  --   PLT 322  --    Cardiac Enzymes: No results found for this basename: CKTOTAL, CKMB, CKMBINDEX, TROPONINI,  in the last 168 hours BNP: BNP (last 3 results)  Recent Labs  09/06/13 0035  PROBNP 1129.0*   CBG:  Recent Labs Lab 09/13/13 0733 09/13/13 1112 09/13/13 1632 09/13/13 2016 09/14/13 0801  GLUCAP 125* 120* 189* 101* 117*       Signed:  Kelvin Cellar  [facility] 09/14/2013, 11:49 AM

## 2013-09-14 NOTE — Consult Note (Signed)
[facility] Face-to-Face Psychiatry Consult   Reason for Consult:  Anxiety, involuntary movements Referring Physician:  Dr. Vernie Hernandez is an 63 y.o. female. Total Time spent with patient: 20 minutes  Assessment: AXIS I:  Anxiety Disorder NOS AXIS II:  Deferred AXIS III:   Past Medical History  Diagnosis Date  . Hypertension   . Diabetes mellitus   . Hyperlipidemia   . History of noncompliance with medical treatment   . Shingles 08/18/2013   AXIS IV:  other psychosocial or environmental problems AXIS V:  41-50 serious symptoms  Plan: Continue cogentin 0.5 mg BID - possible potential EPS Continue neurontin 300 mg PO BID for neuropathic leg pain Continue Klonopin 1 mg PO BID/anxiety continue Lexapro 10 mg PO QD for depression No evidence of imminent risk to self or others at present.   Patient does not meet criteria for psychiatric inpatient admission. Supportive therapy provided about ongoing stressors. Appreciate psychiatric consultation and will sign off today Please contact [phone] if needs further assistance  Subjective:   [name] is a 63 y.o. female patient admitted with CHF. She was just in the hospital last month with problems with ambulation. Workup has been negative including brain MRI. For the last week she has been developing involuntary ,movements in her face arms and legs. She has no history of antipsychotic use. Husband states she has no prior neurological history. 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 Pt has history of mild depression and was on Lexapro and Amitryptiline in the past. She states amitryptiline helped her sleep. She has been under stress lately as two grandchildren aged 66 and 62 moved back into her home. She physically helped moved them and got overwhelmed.The movements have been present for one week and she also claims that she "cannot see." She denies suicidal ideation, homicidal ideation, no history of substance use. Pt  seen today 09/11/13. She is still exhibiting abnormal movements but much less than yesterday. States that her mood is good.She slept well last night on amitryptiline. No psychotic symptoms. Patient reported she understood stress secondary to her grandchildren moving into her home but does not listen to her. She does not want to put them out of the home because it is not good for them. Patient has been compliant with psychiatric medication for depression anxiety and also neuropathic pain. Patient continue to endorses no suicidal, homicidal ideation, intention or plans. Patient has no evidence of psychosis. Will provide benztropine 0.5 mg twice daily abnormal movements.   Interval history: Patient is seen for psychiatric consultation followup and patient sisters who is at bedside. Patient is feeling less depressed, anxious, much calm and slept better last night. Patient and her sister has been concerned about no changes in her abnormal involuntary movements. Patient has been compliant with her medication management. Patient denies suicidal, homicidal ideation, intention or plans. Patient has no evidence of psychosis.   HPI Elements:   Location:  global. Quality:  generalized. Severity:  severe. Timing:  one week. Duration:  several months. Context:  grandchildren moving back .  Past Psychiatric History: Past Medical History  Diagnosis Date  . Hypertension   . Diabetes mellitus   . Hyperlipidemia   . History of noncompliance with medical treatment   . Shingles 08/18/2013    reports that she has never smoked. She has never used smokeless tobacco. She reports that she does not drink alcohol or use illicit drugs. Family History  Problem Relation Age of  Onset  . Diabetes    . Hypertension    . Hypertension Father   . Diabetes Father   . Hypertension Mother   . Diabetes Mother   . Diabetes Sister   . Hypertension Sister   . Diabetes Sister   . Hypertension Sister      Living Arrangements:  Spouse/significant other;Other relatives   Abuse/Neglect [facility]) Physical Abuse: Denies Verbal Abuse: Denies Sexual Abuse: Denies Allergies:   Allergies  Allergen Reactions  . Codeine Nausea And Vomiting  . Grapefruit Concentrate Itching  . Morphine And Related Other (See Comments)    Extreme sedation  . Peanut-Containing Drug Products Nausea And Vomiting     Objective: Blood pressure 136/79, pulse 55, temperature 99.5 F (37.5 C), temperature source Oral, resp. rate 18, height $RemoveBe'5\' 6"'[id number]$  (1.676 m), weight 75.615 kg (166 lb 11.2 oz), SpO2 94.00%.Body mass index is 26.92 kg/(m^2). Results for orders placed during the hospital encounter of 09/05/13 (from the past 72 hour(s))  GLUCOSE, CAPILLARY     Status: Abnormal   Collection Time    09/11/13  4:25 PM      Result Value Ref Range   Glucose-Capillary 111 (*) 70 - 99 mg/dL   Comment 1 Documented in Chart     Comment 2 Notify RN    GLUCOSE, CAPILLARY     Status: Abnormal   Collection Time    09/11/13  9:01 PM      Result Value Ref Range   Glucose-Capillary 178 (*) 70 - 99 mg/dL   Comment 1 Notify RN    BASIC METABOLIC PANEL     Status: Abnormal   Collection Time    09/12/13  3:25 AM      Result Value Ref Range   Sodium 139  137 - 147 mEq/L   Potassium 4.3  3.7 - 5.3 mEq/L   Chloride 106  96 - 112 mEq/L   CO2 20  19 - 32 mEq/L   Glucose, Bld 148 (*) 70 - 99 mg/dL   BUN 20  6 - 23 mg/dL   Creatinine, Ser 1.66 (*) 0.50 - 1.10 mg/dL   Calcium 9.0  8.4 - 10.5 mg/dL   GFR calc non Af Amer 32 (*) >90 mL/min   GFR calc Af Amer 37 (*) >90 mL/min   Comment: (NOTE)     The eGFR has been calculated using the CKD EPI equation.     This calculation has not been validated in all clinical situations.     eGFR's persistently <90 mL/min signify possible Chronic Kidney     Disease.   Anion gap 13  5 - 15  CBC WITH DIFFERENTIAL     Status: Abnormal   Collection Time    09/12/13  3:25 AM      Result Value Ref Range   WBC 8.6  4.0 - 10.5  K/uL   Comment: REPEATED TO VERIFY     WHITE COUNT CONFIRMED ON SMEAR   RBC 2.83 (*) 3.87 - 5.11 MIL/uL   Hemoglobin 8.0 (*) 12.0 - 15.0 g/dL   HCT 25.0 (*) 36.0 - 46.0 %   MCV 88.3  78.0 - 100.0 fL   MCH 28.3  26.0 - 34.0 pg   MCHC 32.0  30.0 - 36.0 g/dL   RDW 13.8  11.5 - 15.5 %   Platelets 322  150 - 400 K/uL   Neutrophils Relative % 68  43 - 77 %   Lymphocytes Relative 22  12 - 46 %  Monocytes Relative 9  3 - 12 %   Eosinophils Relative 1  0 - 5 %   Basophils Relative 0  0 - 1 %   Neutro Abs 5.8  1.7 - 7.7 K/uL   Lymphs Abs 1.9  0.7 - 4.0 K/uL   Monocytes Absolute 0.8  0.1 - 1.0 K/uL   Eosinophils Absolute 0.1  0.0 - 0.7 K/uL   Basophils Absolute 0.0  0.0 - 0.1 K/uL  GLUCOSE, CAPILLARY     Status: Abnormal   Collection Time    09/12/13  7:38 AM      Result Value Ref Range   Glucose-Capillary 135 (*) 70 - 99 mg/dL   Comment 1 Notify RN    GLUCOSE, CAPILLARY     Status: Abnormal   Collection Time    09/12/13 11:58 AM      Result Value Ref Range   Glucose-Capillary 168 (*) 70 - 99 mg/dL  HEMOGLOBIN AND HEMATOCRIT, BLOOD     Status: Abnormal   Collection Time    09/12/13  1:00 PM      Result Value Ref Range   Hemoglobin 8.8 (*) 12.0 - 15.0 g/dL   HCT 28.3 (*) 36.0 - 46.0 %  OCCULT BLOOD X 1 CARD TO LAB, STOOL     Status: None   Collection Time    09/12/13  2:32 PM      Result Value Ref Range   Fecal Occult Bld NEGATIVE  NEGATIVE  GLUCOSE, CAPILLARY     Status: Abnormal   Collection Time    09/12/13  4:39 PM      Result Value Ref Range   Glucose-Capillary 68 (*) 70 - 99 mg/dL  GLUCOSE, CAPILLARY     Status: Abnormal   Collection Time    09/12/13  5:27 PM      Result Value Ref Range   Glucose-Capillary 100 (*) 70 - 99 mg/dL   Comment 1 Notify RN    GLUCOSE, CAPILLARY     Status: Abnormal   Collection Time    09/12/13  9:48 PM      Result Value Ref Range   Glucose-Capillary 135 (*) 70 - 99 mg/dL  BASIC METABOLIC PANEL     Status: Abnormal   Collection Time     09/13/13  5:30 AM      Result Value Ref Range   Sodium 140  137 - 147 mEq/L   Potassium 4.3  3.7 - 5.3 mEq/L   Chloride 106  96 - 112 mEq/L   CO2 24  19 - 32 mEq/L   Glucose, Bld 138 (*) 70 - 99 mg/dL   BUN 21  6 - 23 mg/dL   Creatinine, Ser 1.71 (*) 0.50 - 1.10 mg/dL   Calcium 9.2  8.4 - 10.5 mg/dL   GFR calc non Af Amer 30 (*) >90 mL/min   GFR calc Af Amer 35 (*) >90 mL/min   Comment: (NOTE)     The eGFR has been calculated using the CKD EPI equation.     This calculation has not been validated in all clinical situations.     eGFR's persistently <90 mL/min signify possible Chronic Kidney     Disease.   Anion gap 10  5 - 15  GLUCOSE, CAPILLARY     Status: Abnormal   Collection Time    09/13/13  7:33 AM      Result Value Ref Range   Glucose-Capillary 125 (*) 70 - 99 mg/dL  GLUCOSE, CAPILLARY  Status: Abnormal   Collection Time    09/13/13 11:12 AM      Result Value Ref Range   Glucose-Capillary 120 (*) 70 - 99 mg/dL  GLUCOSE, CAPILLARY     Status: Abnormal   Collection Time    09/13/13  4:32 PM      Result Value Ref Range   Glucose-Capillary 189 (*) 70 - 99 mg/dL  GLUCOSE, CAPILLARY     Status: Abnormal   Collection Time    09/13/13  8:16 PM      Result Value Ref Range   Glucose-Capillary 101 (*) 70 - 99 mg/dL  GLUCOSE, CAPILLARY     Status: Abnormal   Collection Time    09/14/13  8:01 AM      Result Value Ref Range   Glucose-Capillary 117 (*) 70 - 99 mg/dL  BASIC METABOLIC PANEL     Status: Abnormal   Collection Time    09/14/13  8:10 AM      Result Value Ref Range   Sodium 141  137 - 147 mEq/L   Potassium 4.2  3.7 - 5.3 mEq/L   Chloride 103  96 - 112 mEq/L   CO2 24  19 - 32 mEq/L   Glucose, Bld 112 (*) 70 - 99 mg/dL   BUN 22  6 - 23 mg/dL   Creatinine, Ser 3.48 (*) 0.50 - 1.10 mg/dL   Calcium 9.2  8.4 - 27.7 mg/dL   GFR calc non Af Amer 28 (*) >90 mL/min   GFR calc Af Amer 33 (*) >90 mL/min   Comment: (NOTE)     The eGFR has been calculated using the  CKD EPI equation.     This calculation has not been validated in all clinical situations.     eGFR's persistently <90 mL/min signify possible Chronic Kidney     Disease.   Anion gap 14  5 - 15  GLUCOSE, CAPILLARY     Status: Abnormal   Collection Time    09/14/13 11:56 AM      Result Value Ref Range   Glucose-Capillary 105 (*) 70 - 99 mg/dL  GLUCOSE, CAPILLARY     Status: None   Collection Time    09/14/13 12:46 PM      Result Value Ref Range   Glucose-Capillary 80  70 - 99 mg/dL   Labs are reviewed.  Current Facility-Administered Medications  Medication Dose Route Frequency Provider Last Rate Last Dose  . acetaminophen (TYLENOL) tablet 650 mg  650 mg Oral Q6H PRN Eduard Clos, MD   650 mg at 09/13/13 1103  . aspirin EC tablet 81 mg  81 mg Oral Daily Eduard Clos, MD   81 mg at 09/14/13 1002  . atorvastatin (LIPITOR) tablet 10 mg  10 mg Oral q1800 Eduard Clos, MD   10 mg at 09/11/13 1901  . benztropine (COGENTIN) tablet 0.5 mg  0.5 mg Oral BID Nehemiah Settle, MD   0.5 mg at 09/14/13 1009  . bisacodyl (DULCOLAX) suppository 10 mg  10 mg Rectal Daily PRN Jerald Kief, MD      . carvedilol (COREG) tablet 6.25 mg  6.25 mg Oral BID WC Eduard Clos, MD   6.25 mg at 09/14/13 1002  . clonazePAM (KLONOPIN) tablet 1 mg  1 mg Oral BID Nehemiah Settle, MD   1 mg at 09/13/13 2147  . docusate sodium (COLACE) capsule 200 mg  200 mg Oral Daily PRN Leda Gauze, NP  200 mg at 09/12/13 2315  . enoxaparin (LOVENOX) injection 40 mg  40 mg Subcutaneous Q24H Eduard Clos, MD   40 mg at 09/14/13 1006  . escitalopram (LEXAPRO) tablet 10 mg  10 mg Oral Daily Nehemiah Settle, MD   10 mg at 09/14/13 1002  . furosemide (LASIX) injection 40 mg  40 mg Intravenous Daily Jerald Kief, MD   40 mg at 09/14/13 1006  . gabapentin (NEURONTIN) capsule 300 mg  300 mg Oral BID Jerald Kief, MD   300 mg at 09/12/13 1323  . glucose-Vitamin C  4-0.006 GM per chewable tablet 4 tablet  4 tablet Oral PRN Jerald Kief, MD      . insulin aspart (novoLOG) injection 0-9 Units  0-9 Units Subcutaneous TID WC Eduard Clos, MD   2 Units at 09/13/13 1657  . insulin glargine (LANTUS) injection 8 Units  8 Units Subcutaneous BID Jerald Kief, MD   8 Units at 09/14/13 1002  . methocarbamol (ROBAXIN) tablet 500 mg  500 mg Oral Q8H PRN Leroy Sea, MD   500 mg at 09/06/13 1614  . naphazoline (NAPHCON) 0.1 % ophthalmic solution 2 drop  2 drop Both Eyes QID PRN Jerald Kief, MD   2 drop at 09/09/13 1736  . ondansetron (ZOFRAN) injection 4 mg  4 mg Intravenous Q6H PRN Eduard Clos, MD      . polyethylene glycol (MIRALAX / GLYCOLAX) packet 17 g  17 g Oral Daily Jerald Kief, MD   17 g at 09/13/13 1659  . sodium chloride 0.9 % injection 3 mL  3 mL Intravenous Q12H Eduard Clos, MD   3 mL at 09/12/13 2255  . sodium chloride 0.9 % injection 3 mL  3 mL Intravenous Q12H Eduard Clos, MD   3 mL at 09/14/13 1001    Psychiatric Specialty Exam:     Blood pressure 136/79, pulse 55, temperature 99.5 F (37.5 C), temperature source Oral, resp. rate 18, height 5\' 6"  (1.676 m), weight 75.615 kg (166 lb 11.2 oz), SpO2 94.00%.Body mass index is 26.92 kg/(m^2).  General Appearance: Bizarre and Disheveled  Still moving but less so  Eye Contact::  Fair  Speech:  Clear and Coherent  Volume:  Normal  Mood:  Anxious  Affect:  Full Range  Thought Process: improved  Orientation:  Full (Time, Place, and Person)  Thought Content:  Rumination  Suicidal Thoughts:  No  Homicidal Thoughts:  No  Memory:  Immediate;   Fair Recent;   Fair Remote;   Fair  Judgement:  Poor  Insight:  Lacking  Psychomotor Activity: still some choreiaform movements but much decreased  Concentration:  Poor  Recall:  [id number] of Knowledge:Fair  Language: Fair  Akathisia:  No  Handed:  Right  AIMS (if indicated):     Assets:  Communication  Skills Desire for Improvement Social Support  Sleep:   poor   Musculoskeletal: Strength & Muscle Tone: within normal limits Gait & Station: unsteady Patient leans: N/A  Treatment Plan Summary: Daily contact with patient to assess and evaluate symptoms and progress in treatment Medication management Continue benztropine 0.5 mg twice daily Continue neurontin 300 mg PO BID for neuropathic leg pain Continue Klonopin 1 mg PO BID/anxiety Continue Lexapro 10 mg PO QD for depression  Patient will be referred to the outpatient psychiatric services when she is medically stable.  Psychiatric consultation will sign off at this time  [facility] R.  09/14/2013 2:52 PM

## 2013-09-14 NOTE — Progress Notes (Signed)
Occupational Therapy Treatment Patient Details Name: [name] MRN: [id number] DOB: 1950/08/31 Today's Date: 09/14/2013    History of present illness [name] is a 63 y.o. female with history of hypertension and diabetes mellitus who has not been very compliant with her medications presents with complaints of weakness of the lower extremity. CT and MRI (-), UTI   OT comments  Pt overall set up to Mod I with all ADL and mobility @ RW level. Pt with significant  writhing movements on entrance. Very limited abnormal movements during ADL and able complete ADL at mod I level. No further Ot indicated.  Follow Up Recommendations  No OT follow up;Supervision - Intermittent    Equipment Recommendations  None recommended by OT    Recommendations for Other Services      Precautions / Restrictions Precautions Precautions: Fall Restrictions Weight Bearing Restrictions: No       Mobility Bed Mobility Overal bed mobility: Modified Independent                Transfers Overall transfer level: Modified independent                    Balance Overall balance assessment: Modified Independent                                 ADL Overall ADL's : Needs assistance/impaired                                     Functional mobility during ADLs: Supervision/safety;Rolling walker General ADL Comments: set up with all ADL      Vision                 Additional Comments: Pt reports not being able to see anything, however, she accurately managed all items during ADL. no over/undershooting   Perception     Praxis      Cognition   Behavior During Therapy: WFL for tasks assessed/performed Overall Cognitive Status: Within Functional Limits for tasks assessed                       Extremity/Trunk Assessment               Exercises     Shoulder Instructions       General Comments      Pertinent Vitals/  Pain       Pain Assessment: No/denies pain  Home Living                                          Prior Functioning/Environment              Frequency       Progress Toward Goals  OT Goals(current goals can now be found in the care plan section)  Progress towards OT goals: Goals met/education completed, patient discharged from OT  ADL Goals Pt Will Perform Lower Body Dressing: with modified independence;sit to/from stand Pt Will Transfer to Toilet: with modified independence;ambulating;regular height toilet;grab bars Additional ADL Goal #1: Pt will independently perform HEP to increase strength in UE's.  Plan Discharge plan remains appropriate    Co-evaluation  End of Session Equipment Utilized During Treatment: Rolling walker   Activity Tolerance Patient tolerated treatment well   Patient Left in chair;with call bell/phone within reach;with chair alarm set   Nurse Communication Mobility status        Time: 2683-4196 OT Time Calculation (min): 17 min  Charges: OT General Charges $OT Visit: 1 Procedure OT Treatments $Self Care/Home Management : 8-22 mins  Hogan Hoobler,HILLARY 09/14/2013, 12:28 PM   [facility], OTR/L  [phone] 09/14/2013

## 2013-09-15 ENCOUNTER — Encounter: Payer: Self-pay | Admitting: *Deleted

## 2013-09-15 NOTE — Progress Notes (Signed)
Prior Authorization Request received form [facility]. Contacted [employer] spoke with [employer]. Form faxed over to be completed for Prior Authorization. Fax received. Form placed on Dr. Christa See desk for completion and review. Vivia Birmingham, RN

## 2013-09-16 ENCOUNTER — Emergency Department (HOSPITAL_COMMUNITY): Payer: BC Managed Care – PPO

## 2013-09-16 ENCOUNTER — Inpatient Hospital Stay (HOSPITAL_COMMUNITY)
Admission: EM | Admit: 2013-09-16 | Discharge: 2013-10-20 | DRG: 682 | Disposition: E | Payer: BC Managed Care – PPO | Attending: Pulmonary Disease | Admitting: Pulmonary Disease

## 2013-09-16 ENCOUNTER — Encounter (HOSPITAL_COMMUNITY): Payer: Self-pay | Admitting: Emergency Medicine

## 2013-09-16 DIAGNOSIS — A419 Sepsis, unspecified organism: Secondary | ICD-10-CM | POA: Diagnosis present

## 2013-09-16 DIAGNOSIS — F329 Major depressive disorder, single episode, unspecified: Secondary | ICD-10-CM | POA: Diagnosis present

## 2013-09-16 DIAGNOSIS — N183 Chronic kidney disease, stage 3 unspecified: Secondary | ICD-10-CM | POA: Diagnosis present

## 2013-09-16 DIAGNOSIS — R451 Restlessness and agitation: Secondary | ICD-10-CM

## 2013-09-16 DIAGNOSIS — I498 Other specified cardiac arrhythmias: Secondary | ICD-10-CM | POA: Diagnosis not present

## 2013-09-16 DIAGNOSIS — F3289 Other specified depressive episodes: Secondary | ICD-10-CM | POA: Diagnosis present

## 2013-09-16 DIAGNOSIS — N2581 Secondary hyperparathyroidism of renal origin: Secondary | ICD-10-CM | POA: Diagnosis present

## 2013-09-16 DIAGNOSIS — Z833 Family history of diabetes mellitus: Secondary | ICD-10-CM

## 2013-09-16 DIAGNOSIS — J9600 Acute respiratory failure, unspecified whether with hypoxia or hypercapnia: Secondary | ICD-10-CM | POA: Diagnosis not present

## 2013-09-16 DIAGNOSIS — G9340 Encephalopathy, unspecified: Secondary | ICD-10-CM | POA: Diagnosis not present

## 2013-09-16 DIAGNOSIS — E876 Hypokalemia: Secondary | ICD-10-CM | POA: Diagnosis not present

## 2013-09-16 DIAGNOSIS — E870 Hyperosmolality and hypernatremia: Secondary | ICD-10-CM | POA: Diagnosis not present

## 2013-09-16 DIAGNOSIS — Z8249 Family history of ischemic heart disease and other diseases of the circulatory system: Secondary | ICD-10-CM | POA: Diagnosis not present

## 2013-09-16 DIAGNOSIS — N179 Acute kidney failure, unspecified: Secondary | ICD-10-CM | POA: Diagnosis present

## 2013-09-16 DIAGNOSIS — R1310 Dysphagia, unspecified: Secondary | ICD-10-CM | POA: Diagnosis not present

## 2013-09-16 DIAGNOSIS — T380X5A Adverse effect of glucocorticoids and synthetic analogues, initial encounter: Secondary | ICD-10-CM | POA: Diagnosis not present

## 2013-09-16 DIAGNOSIS — I129 Hypertensive chronic kidney disease with stage 1 through stage 4 chronic kidney disease, or unspecified chronic kidney disease: Secondary | ICD-10-CM | POA: Diagnosis present

## 2013-09-16 DIAGNOSIS — E1149 Type 2 diabetes mellitus with other diabetic neurological complication: Secondary | ICD-10-CM | POA: Diagnosis present

## 2013-09-16 DIAGNOSIS — F411 Generalized anxiety disorder: Secondary | ICD-10-CM | POA: Diagnosis present

## 2013-09-16 DIAGNOSIS — F430 Acute stress reaction: Secondary | ICD-10-CM

## 2013-09-16 DIAGNOSIS — Z7982 Long term (current) use of aspirin: Secondary | ICD-10-CM

## 2013-09-16 DIAGNOSIS — E1142 Type 2 diabetes mellitus with diabetic polyneuropathy: Secondary | ICD-10-CM | POA: Diagnosis present

## 2013-09-16 DIAGNOSIS — I509 Heart failure, unspecified: Secondary | ICD-10-CM | POA: Diagnosis present

## 2013-09-16 DIAGNOSIS — R6510 Systemic inflammatory response syndrome (SIRS) of non-infectious origin without acute organ dysfunction: Secondary | ICD-10-CM | POA: Diagnosis present

## 2013-09-16 DIAGNOSIS — D649 Anemia, unspecified: Secondary | ICD-10-CM | POA: Diagnosis present

## 2013-09-16 DIAGNOSIS — G255 Other chorea: Secondary | ICD-10-CM | POA: Diagnosis present

## 2013-09-16 DIAGNOSIS — Z91010 Allergy to peanuts: Secondary | ICD-10-CM | POA: Diagnosis not present

## 2013-09-16 DIAGNOSIS — G259 Extrapyramidal and movement disorder, unspecified: Secondary | ICD-10-CM

## 2013-09-16 DIAGNOSIS — Z885 Allergy status to narcotic agent status: Secondary | ICD-10-CM

## 2013-09-16 DIAGNOSIS — I10 Essential (primary) hypertension: Secondary | ICD-10-CM | POA: Diagnosis present

## 2013-09-16 DIAGNOSIS — D249 Benign neoplasm of unspecified breast: Secondary | ICD-10-CM | POA: Diagnosis present

## 2013-09-16 DIAGNOSIS — I5042 Chronic combined systolic (congestive) and diastolic (congestive) heart failure: Secondary | ICD-10-CM | POA: Diagnosis present

## 2013-09-16 DIAGNOSIS — M6282 Rhabdomyolysis: Secondary | ICD-10-CM | POA: Diagnosis present

## 2013-09-16 DIAGNOSIS — Z66 Do not resuscitate: Secondary | ICD-10-CM | POA: Diagnosis present

## 2013-09-16 DIAGNOSIS — E872 Acidosis, unspecified: Secondary | ICD-10-CM | POA: Diagnosis present

## 2013-09-16 DIAGNOSIS — Z794 Long term (current) use of insulin: Secondary | ICD-10-CM | POA: Diagnosis not present

## 2013-09-16 DIAGNOSIS — Z9119 Patient's noncompliance with other medical treatment and regimen: Secondary | ICD-10-CM

## 2013-09-16 DIAGNOSIS — E1165 Type 2 diabetes mellitus with hyperglycemia: Secondary | ICD-10-CM

## 2013-09-16 DIAGNOSIS — Z91199 Patient's noncompliance with other medical treatment and regimen due to unspecified reason: Secondary | ICD-10-CM

## 2013-09-16 DIAGNOSIS — J15212 Pneumonia due to Methicillin resistant Staphylococcus aureus: Secondary | ICD-10-CM | POA: Diagnosis not present

## 2013-09-16 DIAGNOSIS — R0681 Apnea, not elsewhere classified: Secondary | ICD-10-CM | POA: Diagnosis not present

## 2013-09-16 DIAGNOSIS — E785 Hyperlipidemia, unspecified: Secondary | ICD-10-CM | POA: Diagnosis present

## 2013-09-16 DIAGNOSIS — I5022 Chronic systolic (congestive) heart failure: Secondary | ICD-10-CM

## 2013-09-16 DIAGNOSIS — Z515 Encounter for palliative care: Secondary | ICD-10-CM

## 2013-09-16 DIAGNOSIS — I5021 Acute systolic (congestive) heart failure: Secondary | ICD-10-CM

## 2013-09-16 DIAGNOSIS — IMO0002 Reserved for concepts with insufficient information to code with codable children: Secondary | ICD-10-CM

## 2013-09-16 DIAGNOSIS — R092 Respiratory arrest: Secondary | ICD-10-CM

## 2013-09-16 DIAGNOSIS — E119 Type 2 diabetes mellitus without complications: Secondary | ICD-10-CM

## 2013-09-16 DIAGNOSIS — R259 Unspecified abnormal involuntary movements: Secondary | ICD-10-CM | POA: Diagnosis present

## 2013-09-16 DIAGNOSIS — E1169 Type 2 diabetes mellitus with other specified complication: Secondary | ICD-10-CM | POA: Diagnosis present

## 2013-09-16 DIAGNOSIS — R4182 Altered mental status, unspecified: Secondary | ICD-10-CM | POA: Diagnosis present

## 2013-09-16 DIAGNOSIS — J9601 Acute respiratory failure with hypoxia: Secondary | ICD-10-CM

## 2013-09-16 LAB — CBC WITH DIFFERENTIAL/PLATELET
BASOS ABS: 0 10*3/uL (ref 0.0–0.1)
Basophils Relative: 0 % (ref 0–1)
EOS ABS: 0.1 10*3/uL (ref 0.0–0.7)
EOS PCT: 1 % (ref 0–5)
HCT: 25.1 % — ABNORMAL LOW (ref 36.0–46.0)
Hemoglobin: 7.7 g/dL — ABNORMAL LOW (ref 12.0–15.0)
Lymphocytes Relative: 24 % (ref 12–46)
Lymphs Abs: 4.9 10*3/uL — ABNORMAL HIGH (ref 0.7–4.0)
MCH: 27.9 pg (ref 26.0–34.0)
MCHC: 30.7 g/dL (ref 30.0–36.0)
MCV: 90.9 fL (ref 78.0–100.0)
Monocytes Absolute: 1.3 10*3/uL — ABNORMAL HIGH (ref 0.1–1.0)
Monocytes Relative: 6 % (ref 3–12)
Neutro Abs: 14.5 10*3/uL — ABNORMAL HIGH (ref 1.7–7.7)
Neutrophils Relative %: 69 % (ref 43–77)
Platelets: 735 10*3/uL — ABNORMAL HIGH (ref 150–400)
RBC: 2.76 MIL/uL — ABNORMAL LOW (ref 3.87–5.11)
RDW: 14.4 % (ref 11.5–15.5)
WBC: 21 10*3/uL — AB (ref 4.0–10.5)

## 2013-09-16 LAB — URINALYSIS, ROUTINE W REFLEX MICROSCOPIC
Glucose, UA: NEGATIVE mg/dL
Ketones, ur: NEGATIVE mg/dL
Leukocytes, UA: NEGATIVE
Nitrite: NEGATIVE
SPECIFIC GRAVITY, URINE: 1.028 (ref 1.005–1.030)
UROBILINOGEN UA: 0.2 mg/dL (ref 0.0–1.0)
pH: 5 (ref 5.0–8.0)

## 2013-09-16 LAB — I-STAT ARTERIAL BLOOD GAS, ED
ACID-BASE DEFICIT: 4 mmol/L — AB (ref 0.0–2.0)
BICARBONATE: 20.3 meq/L (ref 20.0–24.0)
O2 Saturation: 94 %
PO2 ART: 78 mmHg — AB (ref 80.0–100.0)
TCO2: 21 mmol/L (ref 0–100)
pCO2 arterial: 35.6 mmHg (ref 35.0–45.0)
pH, Arterial: 7.37 (ref 7.350–7.450)

## 2013-09-16 LAB — COMPREHENSIVE METABOLIC PANEL
ALT: 71 U/L — AB (ref 0–35)
AST: 144 U/L — AB (ref 0–37)
Albumin: 2.4 g/dL — ABNORMAL LOW (ref 3.5–5.2)
Alkaline Phosphatase: 168 U/L — ABNORMAL HIGH (ref 39–117)
Anion gap: 25 — ABNORMAL HIGH (ref 5–15)
BUN: 34 mg/dL — ABNORMAL HIGH (ref 6–23)
CALCIUM: 9.7 mg/dL (ref 8.4–10.5)
CO2: 13 mEq/L — ABNORMAL LOW (ref 19–32)
CREATININE: 2 mg/dL — AB (ref 0.50–1.10)
Chloride: 102 mEq/L (ref 96–112)
GFR calc Af Amer: 29 mL/min — ABNORMAL LOW (ref >90)
GFR calc non Af Amer: 25 mL/min — ABNORMAL LOW (ref >90)
Glucose, Bld: 161 mg/dL — ABNORMAL HIGH (ref 70–99)
Potassium: 4.8 mEq/L (ref 3.7–5.3)
Sodium: 140 mEq/L (ref 137–147)
TOTAL PROTEIN: 7 g/dL (ref 6.0–8.3)
Total Bilirubin: 0.4 mg/dL (ref 0.3–1.2)

## 2013-09-16 LAB — I-STAT CG4 LACTIC ACID, ED: LACTIC ACID, VENOUS: 8.22 mmol/L — AB (ref 0.5–2.2)

## 2013-09-16 LAB — URINE MICROSCOPIC-ADD ON

## 2013-09-16 LAB — PRO B NATRIURETIC PEPTIDE: PRO B NATRI PEPTIDE: 4061 pg/mL — AB (ref 0–125)

## 2013-09-16 LAB — CK: CK TOTAL: 9082 U/L — AB (ref 7–177)

## 2013-09-16 LAB — TROPONIN I: Troponin I: <0.3 ng/mL (ref <0.30)

## 2013-09-16 MED ORDER — LORAZEPAM 2 MG/ML IJ SOLN
INTRAMUSCULAR | Status: AC
Start: 2013-09-16 — End: 2013-09-17
  Filled 2013-09-16: qty 1

## 2013-09-16 MED ORDER — STERILE WATER FOR INJECTION IV SOLN
INTRAVENOUS | Status: DC
  Administered 2013-09-16: 22:00:00 via INTRAVENOUS
  Filled 2013-09-16 (×3): qty 850

## 2013-09-16 MED ORDER — LORAZEPAM 2 MG/ML IJ SOLN
1.0000 mg | Freq: Once | INTRAMUSCULAR | Status: AC
  Administered 2013-09-16: 1 mg via INTRAVENOUS

## 2013-09-16 MED ORDER — LORAZEPAM 2 MG/ML IJ SOLN
INTRAMUSCULAR | Status: AC
  Filled 2013-09-16: qty 1

## 2013-09-16 MED ORDER — BENZTROPINE MESYLATE 1 MG/ML IJ SOLN
2.0000 mg | Freq: Once | INTRAMUSCULAR | Status: AC
  Administered 2013-09-16: 2 mg via INTRAVENOUS
  Filled 2013-09-16: qty 2

## 2013-09-16 MED ORDER — SODIUM CHLORIDE 0.9 % IV SOLN
INTRAVENOUS | Status: DC
  Filled 2013-09-16 (×2): qty 150

## 2013-09-16 MED ORDER — LORAZEPAM 2 MG/ML IJ SOLN
1.0000 mg | Freq: Once | INTRAMUSCULAR | Status: AC
  Administered 2013-09-16: 1 mg via INTRAVENOUS
  Filled 2013-09-16: qty 1

## 2013-09-16 MED ORDER — SODIUM CHLORIDE 0.9 % IV BOLUS (SEPSIS)
500.0000 mL | Freq: Once | INTRAVENOUS | Status: AC
  Administered 2013-09-16: 500 mL via INTRAVENOUS

## 2013-09-16 NOTE — ED Notes (Signed)
Critical care at bedside  

## 2013-09-16 NOTE — ED Notes (Signed)
Notified MD and RN of elevated i-stat Lactic acid of 8.22

## 2013-09-16 NOTE — ED Notes (Signed)
[name] and [name] at bedside to check rectal temp and obtain urine sample.

## 2013-09-16 NOTE — ED Notes (Signed)
Hospitalist at bedside 

## 2013-09-16 NOTE — ED Notes (Signed)
Patient presents to ED via [facility] from home. Per EMS patients husband called EMS due to patient having "increasing tremors." Patient was seen on 09/06/2013 for same symptoms and was discharged home with a follow up with neurology on Monday 08/22/2013. Patient presents "thrashing" in bed-unable to hold still. Does not appear to be seizure like activity. Patient is able to follow all commands and answer questions appropriately.  VSS per EMS. CBG 145. Airway intact. Dr. Betsey Holiday at bedside.

## 2013-09-16 NOTE — ED Notes (Signed)
Dr. Pollina at bedside   

## 2013-09-16 NOTE — ED Notes (Signed)
Family at bedside. 

## 2013-09-16 NOTE — ED Provider Notes (Signed)
CSN: [id number]     Arrival date & time 09/18/2013  1910 History   First MD Initiated Contact with Patient 09/18/2013 1911     Chief Complaint  Patient presents with  . Tremors  . Altered Mental Status     (Consider location/radiation/quality/duration/timing/severity/associated sxs/prior Treatment) HPI Comments: Patient brought to emergency department for movement abnormality. Patient's family accompanied her to the ER. Patient's family tell me that the patient started to have unusual movements approximately 2 months ago. She gradually worsened to the point where she was hospitalized 3 weeks ago. She was in the hospital for approximately a week, firm diagnosis was not made. She had multiple medication changes and was discharged. Report that she was doing better in the hospital, but after going home she started to have recurrence of the symptoms. They think that she wasn't taking her medications because she thought that the medicines made her feel very hot. She progressively worsened again until she was sent to the emergency room, once again, 10 days ago. She was readmitted and stayed until 2 days ago. Since going home she has had progressive worsening of her symptoms.  Arrival to ER, patient is unable to control violent movements of all of her extremities and her head. She is awake and alert, however, oriented and answering questions appropriately.  Patient is a 63 y.o. female presenting with altered mental status.  Altered Mental Status   Past Medical History  Diagnosis Date  . Hypertension   . Diabetes mellitus   . Hyperlipidemia   . History of noncompliance with medical treatment   . Shingles 08/18/2013   Past Surgical History  Procedure Laterality Date  . Diabetes    . Fibroids of breast     Family History  Problem Relation Age of Onset  . Diabetes    . Hypertension    . Hypertension Father   . Diabetes Father   . Hypertension Mother   . Diabetes Mother   . Diabetes Sister   .  Hypertension Sister   . Diabetes Sister   . Hypertension Sister    History  Substance Use Topics  . Smoking status: Never Smoker   . Smokeless tobacco: Never Used  . Alcohol Use: No   OB History   Grav Para Term Preterm Abortions TAB SAB Ect Mult Living   4 4 4       3      Review of Systems  Constitutional: Positive for diaphoresis.  Neurological:       Movement of extremities and head, involuntary  All other systems reviewed and are negative.     Allergies  Codeine; Morphine and related; Peanut-containing drug products; and Grapefruit concentrate  Home Medications   Prior to Admission medications   Medication Sig Start Date End Date Taking? Authorizing Provider  amLODipine-olmesartan (AZOR) 10-40 MG per tablet Take 1 tablet by mouth daily. 08/29/13  Yes Tresa Garter, MD  aspirin EC 81 MG tablet Take 1 tablet (81 mg total) by mouth daily. 08/20/13  Yes Thurnell Lose, MD  atorvastatin (LIPITOR) 10 MG tablet Take 1 tablet (10 mg total) by mouth daily at 6 PM. 08/20/13  Yes Thurnell Lose, MD  benztropine (COGENTIN) 0.5 MG tablet Take 1 tablet (0.5 mg total) by mouth 2 (two) times daily. 09/14/13  Yes Kelvin Cellar, MD  carvedilol (COREG) 6.25 MG tablet Take 1 tablet (6.25 mg total) by mouth 2 (two) times daily with a meal. 08/20/13  Yes Thurnell Lose, MD  clonazePAM (KLONOPIN) 1 MG tablet Take 1 tablet (1 mg total) by mouth 2 (two) times daily. 09/12/13  Yes Donne Hazel, MD  escitalopram (LEXAPRO) 5 MG tablet Take 5 mg by mouth at bedtime.   Yes Historical Provider, MD  furosemide (LASIX) 20 MG tablet Take 20 mg by mouth every morning.   Yes Historical Provider, MD  insulin aspart (NOVOLOG) 100 UNIT/ML injection Inject 0-10 Units into the skin 3 (three) times daily with meals. Before each meal 3 times a day, 140-199 - 2 units, 200-250 - 4 units, 251-299 - 6 units,  300-349 - 8 units,  350 or above 10 units. Insulin PEN if approved, provide syringes and needles if  needed. 08/20/13  Yes Thurnell Lose, MD  insulin glargine (LANTUS) 100 UNIT/ML injection Inject 0.08 mLs (8 Units total) into the skin 2 (two) times daily. 09/14/13  Yes Kelvin Cellar, MD  naproxen sodium (ANAPROX) 220 MG tablet Take 220 mg by mouth daily as needed (for fever).   Yes Historical Provider, MD  polyethylene glycol (MIRALAX / GLYCOLAX) packet Take 17 g by mouth daily. 09/14/13  Yes Kelvin Cellar, MD  Tetrahydrozoline HCl (VISINE OP) Place 1 drop into both eyes daily as needed (dry eyes).   Yes Historical Provider, MD   BP 129/63  Pulse 115  Temp(Src) 99 F (37.2 C) (Rectal)  Resp 23  SpO2 100% Physical Exam  Constitutional: She is oriented to person, place, and time. She appears well-developed and well-nourished. She appears distressed.  HENT:  Head: Normocephalic and atraumatic.  Right Ear: Hearing normal.  Left Ear: Hearing normal.  Nose: Nose normal.  Mouth/Throat: Oropharynx is clear and moist and mucous membranes are normal.  Eyes: Conjunctivae and EOM are normal. Pupils are equal, round, and reactive to light.  Neck: Normal range of motion. Neck supple.  Cardiovascular: Regular rhythm, S1 normal and S2 normal.  Exam reveals no gallop and no friction rub.   No murmur heard. Pulmonary/Chest: Effort normal and breath sounds normal. No respiratory distress. She exhibits no tenderness.  Abdominal: Soft. Normal appearance and bowel sounds are normal. There is no hepatosplenomegaly. There is no tenderness. There is no rebound, no guarding, no tenderness at McBurney's point and negative Murphy's sign. No hernia.  Musculoskeletal: Normal range of motion.  Neurological: She is alert and oriented to person, place, and time. She has normal strength. No cranial nerve deficit or sensory deficit. Coordination normal. GCS eye subscore is 4. GCS verbal subscore is 5. GCS motor subscore is 6.  Violent movements of all extremities, rolling in the bed and thrashing  Skin: Skin is warm  and intact. No rash noted. She is diaphoretic. No cyanosis.  Psychiatric: She has a normal mood and affect. Her speech is normal and behavior is normal. Thought content normal.    ED Course  Procedures (including critical care time) Labs Review Labs Reviewed  CBC WITH DIFFERENTIAL - Abnormal; Notable for the following:    WBC 21.0 (*)    RBC 2.76 (*)    Hemoglobin 7.7 (*)    HCT 25.1 (*)    Platelets 735 (*)    Neutro Abs 14.5 (*)    Lymphs Abs 4.9 (*)    Monocytes Absolute 1.3 (*)    All other components within normal limits  COMPREHENSIVE METABOLIC PANEL - Abnormal; Notable for the following:    CO2 13 (*)    Glucose, Bld 161 (*)    BUN 34 (*)    Creatinine, Ser  2.00 (*)    Albumin 2.4 (*)    AST 144 (*)    ALT 71 (*)    Alkaline Phosphatase 168 (*)    GFR calc non Af Amer 25 (*)    GFR calc Af Amer 29 (*)    Anion gap 25 (*)    All other components within normal limits  CK - Abnormal; Notable for the following:    Total CK 9082 (*)    All other components within normal limits  URINALYSIS, ROUTINE W REFLEX MICROSCOPIC - Abnormal; Notable for the following:    APPearance CLOUDY (*)    Hgb urine dipstick LARGE (*)    Bilirubin Urine SMALL (*)    Protein, ur >300 (*)    All other components within normal limits  PRO B NATRIURETIC PEPTIDE - Abnormal; Notable for the following:    Pro B Natriuretic peptide (BNP) 4061.0 (*)    All other components within normal limits  URINE MICROSCOPIC-ADD ON - Abnormal; Notable for the following:    Casts HYALINE CASTS (*)    All other components within normal limits  I-STAT CG4 LACTIC ACID, ED - Abnormal; Notable for the following:    Lactic Acid, Venous 8.22 (*)    All other components within normal limits  CULTURE, BLOOD (ROUTINE X 2)  CULTURE, BLOOD (ROUTINE X 2)  TROPONIN I    Imaging Review Dg Chest Port 1 View  08/21/2013   CLINICAL DATA:  63 year old female with fever. Initial encounter.  EXAM: PORTABLE CHEST - 1 VIEW   COMPARISON:  09/14/2013 and earlier.  FINDINGS: Portable AP view at 2048 hrs. Stable lung volumes with mild to moderate elevation of the right hemidiaphragm. Stable cardiomegaly and mediastinal contours. Chronic retrocardiac streaky opacity most resembles atelectasis and is stable. No areas of worsening ventilation. No pneumothorax, pulmonary edema, or pleural effusion identified. Visualized tracheal air column is within normal limits.  IMPRESSION: Chronic retrocardiac atelectasis. No acute cardiopulmonary abnormality.   Electronically Signed   By: Lars Pinks M.D.   On: 08/24/2013 21:07     EKG Interpretation   Date/Time:  Friday September 16 2013 19:50:54 EDT Ventricular Rate:  132 PR Interval:  128 QRS Duration: 64 QT Interval:  271 QTC Calculation: 401 R Axis:   69 Text Interpretation:  Sinus tachycardia Probable LVH with secondary repol  abnrm ST depression, consider ischemia, diffuse lds No significant change  since last tracing Confirmed by POLLINA  MD, CHRISTOPHER [phone]) on  09/07/2013 9:11:50 PM      MDM   Final diagnoses:  Non-traumatic rhabdomyolysis  Movement disorder   Patient presents to the ER for evaluation of strange extremity movements. Patient has been experiencing progressively worsening symptoms of agitation, restlessness and movements of her extremities and head for a period of 2 months. Patient has had previous admissions for this, no clear etiology was found.  Upon arrival to the ER, patient was thrashing in the bed and experiencing severe and violent movements of her arms, legs and head. They were not rhythmic in nature, appeared random. It did not appear to be distractible. Patient was awake and alert arrival.  She was in distress, she was diaphoretic and felt warm. She was febrile initially. This was felt to be likely secondary to the amount of movement she was exhibiting, rather than an infectious etiology, but blood cultures were obtained. The patient can't have  significant lactic acidosis. She was administered an IV fluid bolus. Rhabdomyolysis was suspected and confirmed with CPK  of [id number]. Bicarbonate will be added to the patient's IV fluids.  Patient was administered Ativan 1 mg IV arrival without any significant improvement. Second milligram of Ativan was administered. Reviewing records reveals that she did have neurology and psychiatry consultation during hospital stays. This most recent recommendation was to try Cogentin. She was given IV Cogentin here as well. After the administration of these medications, patient to become somnolent. She had brief hypoxia which required oxygen supplementation. She appears to be protecting her airway, however, no consideration for intubation. This will require admission to the step down unit for further management of acute rhabdomyolysis secondary to her movement disorder.    Orpah Greek, MD 09/06/2013 2158

## 2013-09-16 NOTE — ED Notes (Signed)
Patient placed on NRB mask at this time. Tolerating well. Movement has decreased- patient resting at this time. Airway is intact, tachypnea, O2 sat 95% on NRB.

## 2013-09-17 ENCOUNTER — Inpatient Hospital Stay (HOSPITAL_COMMUNITY): Payer: BC Managed Care – PPO

## 2013-09-17 DIAGNOSIS — J9600 Acute respiratory failure, unspecified whether with hypoxia or hypercapnia: Secondary | ICD-10-CM | POA: Diagnosis present

## 2013-09-17 DIAGNOSIS — A419 Sepsis, unspecified organism: Secondary | ICD-10-CM | POA: Diagnosis present

## 2013-09-17 DIAGNOSIS — M6282 Rhabdomyolysis: Secondary | ICD-10-CM

## 2013-09-17 LAB — CBC WITH DIFFERENTIAL/PLATELET
BASOS ABS: 0 10*3/uL (ref 0.0–0.1)
Basophils Relative: 0 % (ref 0–1)
EOS PCT: 0 % (ref 0–5)
Eosinophils Absolute: 0 10*3/uL (ref 0.0–0.7)
HCT: 25.6 % — ABNORMAL LOW (ref 36.0–46.0)
Hemoglobin: 7.8 g/dL — ABNORMAL LOW (ref 12.0–15.0)
LYMPHS ABS: 1.5 10*3/uL (ref 0.7–4.0)
Lymphocytes Relative: 12 % (ref 12–46)
MCH: 27.3 pg (ref 26.0–34.0)
MCHC: 30.5 g/dL (ref 30.0–36.0)
MCV: 89.5 fL (ref 78.0–100.0)
MONO ABS: 0.8 10*3/uL (ref 0.1–1.0)
Monocytes Relative: 6 % (ref 3–12)
Neutro Abs: 10.9 10*3/uL — ABNORMAL HIGH (ref 1.7–7.7)
Neutrophils Relative %: 82 % — ABNORMAL HIGH (ref 43–77)
PLATELETS: 458 10*3/uL — AB (ref 150–400)
RBC: 2.86 MIL/uL — ABNORMAL LOW (ref 3.87–5.11)
RDW: 14.5 % (ref 11.5–15.5)
WBC: 13.3 10*3/uL — ABNORMAL HIGH (ref 4.0–10.5)

## 2013-09-17 LAB — HIV ANTIBODY (ROUTINE TESTING W REFLEX): HIV 1&2 Ab, 4th Generation: NONREACTIVE

## 2013-09-17 LAB — GLUCOSE, CAPILLARY
GLUCOSE-CAPILLARY: 157 mg/dL — AB (ref 70–99)
Glucose-Capillary: 145 mg/dL — ABNORMAL HIGH (ref 70–99)
Glucose-Capillary: 183 mg/dL — ABNORMAL HIGH (ref 70–99)
Glucose-Capillary: 139 mg/dL — ABNORMAL HIGH (ref 70–99)
Glucose-Capillary: 132 mg/dL — ABNORMAL HIGH (ref 70–99)

## 2013-09-17 LAB — URINE MICROSCOPIC-ADD ON

## 2013-09-17 LAB — COMPREHENSIVE METABOLIC PANEL
ALBUMIN: 2.4 g/dL — AB (ref 3.5–5.2)
ALT: 69 U/L — ABNORMAL HIGH (ref 0–35)
ANION GAP: 18 — AB (ref 5–15)
AST: 121 U/L — ABNORMAL HIGH (ref 0–37)
Alkaline Phosphatase: 164 U/L — ABNORMAL HIGH (ref 39–117)
BUN: 34 mg/dL — AB (ref 6–23)
CO2: 20 mEq/L (ref 19–32)
CREATININE: 1.83 mg/dL — AB (ref 0.50–1.10)
Calcium: 8.9 mg/dL (ref 8.4–10.5)
Chloride: 103 mEq/L (ref 96–112)
GFR calc Af Amer: 33 mL/min — ABNORMAL LOW (ref >90)
GFR calc non Af Amer: 28 mL/min — ABNORMAL LOW (ref >90)
Glucose, Bld: 154 mg/dL — ABNORMAL HIGH (ref 70–99)
Potassium: 4.3 mEq/L (ref 3.7–5.3)
Sodium: 141 mEq/L (ref 137–147)
Total Bilirubin: 0.4 mg/dL (ref 0.3–1.2)
Total Protein: 6.7 g/dL (ref 6.0–8.3)

## 2013-09-17 LAB — TSH: TSH: 0.698 u[IU]/mL (ref 0.350–4.500)

## 2013-09-17 LAB — BASIC METABOLIC PANEL
Anion gap: 16 — ABNORMAL HIGH (ref 5–15)
Anion gap: 17 — ABNORMAL HIGH (ref 5–15)
BUN: 33 mg/dL — ABNORMAL HIGH (ref 6–23)
BUN: 33 mg/dL — ABNORMAL HIGH (ref 6–23)
CALCIUM: 8 mg/dL — AB (ref 8.4–10.5)
CALCIUM: 8.1 mg/dL — AB (ref 8.4–10.5)
CO2: 17 mEq/L — ABNORMAL LOW (ref 19–32)
CO2: 17 mEq/L — ABNORMAL LOW (ref 19–32)
CREATININE: 1.83 mg/dL — AB (ref 0.50–1.10)
Chloride: 108 mEq/L (ref 96–112)
Chloride: 108 mEq/L (ref 96–112)
Creatinine, Ser: 1.75 mg/dL — ABNORMAL HIGH (ref 0.50–1.10)
GFR calc Af Amer: 34 mL/min — ABNORMAL LOW (ref >90)
GFR calc Af Amer: 33 mL/min — ABNORMAL LOW (ref >90)
GFR, EST NON AFRICAN AMERICAN: 30 mL/min — AB (ref >90)
GFR, EST NON AFRICAN AMERICAN: 28 mL/min — AB (ref >90)
GLUCOSE: 163 mg/dL — AB (ref 70–99)
Glucose, Bld: 175 mg/dL — ABNORMAL HIGH (ref 70–99)
Potassium: 4.7 mEq/L (ref 3.7–5.3)
Potassium: 4.7 mEq/L (ref 3.7–5.3)
SODIUM: 141 meq/L (ref 137–147)
Sodium: 142 mEq/L (ref 137–147)

## 2013-09-17 LAB — BLOOD GAS, VENOUS
ACID-BASE DEFICIT: 4.3 mmol/L — AB (ref 0.0–2.0)
Bicarbonate: 20.2 mEq/L (ref 20.0–24.0)
O2 SAT: 22.2 %
Patient temperature: 98.6
TCO2: 21.4 mmol/L (ref 0–100)
pCO2, Ven: 37 mmHg — ABNORMAL LOW (ref 45.0–50.0)
pH, Ven: 7.356 — ABNORMAL HIGH (ref 7.250–7.300)

## 2013-09-17 LAB — CBC
HEMATOCRIT: 28.4 % — AB (ref 36.0–46.0)
Hemoglobin: 8.8 g/dL — ABNORMAL LOW (ref 12.0–15.0)
MCH: 27.4 pg (ref 26.0–34.0)
MCHC: 31 g/dL (ref 30.0–36.0)
MCV: 88.5 fL (ref 78.0–100.0)
PLATELETS: 581 10*3/uL — AB (ref 150–400)
RBC: 3.21 MIL/uL — ABNORMAL LOW (ref 3.87–5.11)
RDW: 14.4 % (ref 11.5–15.5)
WBC: 20.7 10*3/uL — ABNORMAL HIGH (ref 4.0–10.5)

## 2013-09-17 LAB — MRSA PCR SCREENING: MRSA by PCR: NEGATIVE

## 2013-09-17 LAB — URINALYSIS, ROUTINE W REFLEX MICROSCOPIC
Bilirubin Urine: NEGATIVE
Glucose, UA: 100 mg/dL — AB
Ketones, ur: 15 mg/dL — AB
Leukocytes, UA: NEGATIVE
NITRITE: NEGATIVE
Protein, ur: >300 mg/dL — AB
SPECIFIC GRAVITY, URINE: 1.028 (ref 1.005–1.030)
Urobilinogen, UA: 0.2 mg/dL (ref 0.0–1.0)
pH: 5 (ref 5.0–8.0)

## 2013-09-17 LAB — LACTIC ACID, PLASMA
LACTIC ACID, VENOUS: 2.2 mmol/L (ref 0.5–2.2)
Lactic Acid, Venous: 1.8 mmol/L (ref 0.5–2.2)

## 2013-09-17 LAB — T4, FREE: Free T4: 1.06 ng/dL (ref 0.80–1.80)

## 2013-09-17 LAB — MAGNESIUM
MAGNESIUM: 2.2 mg/dL (ref 1.5–2.5)
MAGNESIUM: 2.1 mg/dL (ref 1.5–2.5)

## 2013-09-17 LAB — T3, FREE: T3, Free: 2.1 pg/mL — ABNORMAL LOW (ref 2.3–4.2)

## 2013-09-17 LAB — TROPONIN I: Troponin I: <0.3 ng/mL (ref <0.30)

## 2013-09-17 LAB — PHOSPHORUS
PHOSPHORUS: 4.3 mg/dL (ref 2.3–4.6)
Phosphorus: 3.9 mg/dL (ref 2.3–4.6)

## 2013-09-17 LAB — CK: Total CK: 4537 U/L — ABNORMAL HIGH (ref 7–177)

## 2013-09-17 LAB — ABO/RH: ABO/RH(D): A POS

## 2013-09-17 LAB — FIBRINOGEN: Fibrinogen: 740 mg/dL — ABNORMAL HIGH (ref 204–475)

## 2013-09-17 LAB — CORTISOL: Cortisol, Plasma: 26.6 ug/dL

## 2013-09-17 LAB — TYPE AND SCREEN
ABO/RH(D): A POS
Antibody Screen: NEGATIVE

## 2013-09-17 LAB — APTT: aPTT: 24 seconds (ref 24–37)

## 2013-09-17 LAB — PROTIME-INR
INR: 1.14 (ref 0.00–1.49)
Prothrombin Time: 14.6 seconds (ref 11.6–15.2)

## 2013-09-17 MED ORDER — HEPARIN SODIUM (PORCINE) 5000 UNIT/ML IJ SOLN
5000.0000 [IU] | Freq: Three times a day (TID) | INTRAMUSCULAR | Status: DC
  Administered 2013-09-17 – 2013-09-27 (×31): 5000 [IU] via SUBCUTANEOUS
  Filled 2013-09-17 (×38): qty 1

## 2013-09-17 MED ORDER — CLONAZEPAM 0.5 MG PO TABS
1.0000 mg | ORAL_TABLET | Freq: Three times a day (TID) | ORAL | Status: DC
  Administered 2013-09-17: 1 mg via ORAL
  Filled 2013-09-17: qty 2

## 2013-09-17 MED ORDER — INSULIN GLARGINE 100 UNIT/ML ~~LOC~~ SOLN
5.0000 [IU] | Freq: Every day | SUBCUTANEOUS | Status: DC
  Administered 2013-09-17 – 2013-09-20 (×5): 5 [IU] via SUBCUTANEOUS
  Filled 2013-09-17 (×6): qty 0.05

## 2013-09-17 MED ORDER — SODIUM CHLORIDE 0.9 % IV BOLUS (SEPSIS): 1000.0000 mL | INTRAVENOUS | Status: DC | PRN

## 2013-09-17 MED ORDER — PIPERACILLIN-TAZOBACTAM 3.375 G IVPB
3.3750 g | Freq: Three times a day (TID) | INTRAVENOUS | Status: DC
  Administered 2013-09-17 (×3): 3.375 g via INTRAVENOUS
  Filled 2013-09-17 (×6): qty 50

## 2013-09-17 MED ORDER — LORAZEPAM 2 MG/ML IJ SOLN
1.0000 mg | INTRAMUSCULAR | Status: DC | PRN
Start: 2013-09-17 — End: 2013-09-19
  Administered 2013-09-17 – 2013-09-19 (×6): 1 mg via INTRAVENOUS
  Filled 2013-09-17 (×5): qty 1

## 2013-09-17 MED ORDER — SODIUM CHLORIDE 0.9 % IV SOLN
250.0000 mL | INTRAVENOUS | Status: DC | PRN
Start: 2013-09-17 — End: 2013-09-19
  Administered 2013-09-17: 22:00:00 via INTRAVENOUS

## 2013-09-17 MED ORDER — TETRAHYDROZOLINE HCL 0.05 % OP SOLN
2.0000 [drp] | Freq: Two times a day (BID) | OPHTHALMIC | Status: DC
  Administered 2013-09-17 – 2013-10-05 (×37): 2 [drp] via OPHTHALMIC
  Filled 2013-09-17 (×2): qty 15

## 2013-09-17 MED ORDER — SODIUM CHLORIDE 0.9 % IV SOLN: 250.0000 mL | INTRAVENOUS | Status: DC | PRN

## 2013-09-17 MED ORDER — INSULIN ASPART 100 UNIT/ML ~~LOC~~ SOLN
0.0000 [IU] | SUBCUTANEOUS | Status: DC
  Administered 2013-09-17 – 2013-09-18 (×7): 1 [IU] via SUBCUTANEOUS
  Administered 2013-09-19: 2 [IU] via SUBCUTANEOUS
  Administered 2013-09-19 (×3): 1 [IU] via SUBCUTANEOUS
  Administered 2013-09-19: 2 [IU] via SUBCUTANEOUS
  Administered 2013-09-20: 3 [IU] via SUBCUTANEOUS
  Administered 2013-09-20: 9 [IU] via SUBCUTANEOUS
  Administered 2013-09-21: 7 [IU] via SUBCUTANEOUS
  Administered 2013-09-21: 5 [IU] via SUBCUTANEOUS
  Administered 2013-09-21: 2 [IU] via SUBCUTANEOUS
  Administered 2013-09-21: 5 [IU] via SUBCUTANEOUS
  Administered 2013-09-21: 7 [IU] via SUBCUTANEOUS
  Administered 2013-09-22 (×2): 2 [IU] via SUBCUTANEOUS
  Administered 2013-09-22 – 2013-09-23 (×3): 3 [IU] via SUBCUTANEOUS
  Administered 2013-09-23: 7 [IU] via SUBCUTANEOUS
  Administered 2013-09-23 (×2): 3 [IU] via SUBCUTANEOUS
  Administered 2013-09-23: 5 [IU] via SUBCUTANEOUS
  Administered 2013-09-23: 3 [IU] via SUBCUTANEOUS
  Administered 2013-09-23 – 2013-09-24 (×3): 2 [IU] via SUBCUTANEOUS
  Administered 2013-09-24: 1 [IU] via SUBCUTANEOUS
  Administered 2013-09-24: 2 [IU] via SUBCUTANEOUS
  Administered 2013-09-24: 1 [IU] via SUBCUTANEOUS
  Administered 2013-09-25: 5 [IU] via SUBCUTANEOUS
  Administered 2013-09-25: 3 [IU] via SUBCUTANEOUS
  Administered 2013-09-25: 1 [IU] via SUBCUTANEOUS
  Administered 2013-09-25: 2 [IU] via SUBCUTANEOUS
  Administered 2013-09-25 – 2013-09-27 (×9): 1 [IU] via SUBCUTANEOUS
  Administered 2013-09-27: 2 [IU] via SUBCUTANEOUS
  Administered 2013-09-27: 20:00:00 via SUBCUTANEOUS
  Administered 2013-09-29: 2 [IU] via SUBCUTANEOUS
  Administered 2013-09-29 (×4): 1 [IU] via SUBCUTANEOUS
  Administered 2013-09-30: 2 [IU] via SUBCUTANEOUS
  Administered 2013-09-30: 1 [IU] via SUBCUTANEOUS
  Administered 2013-09-30: 2 [IU] via SUBCUTANEOUS
  Administered 2013-09-30: 4 [IU] via SUBCUTANEOUS
  Administered 2013-09-30: 1 [IU] via SUBCUTANEOUS
  Administered 2013-09-30: 2 [IU] via SUBCUTANEOUS
  Administered 2013-10-01 (×2): 1 [IU] via SUBCUTANEOUS
  Administered 2013-10-01 – 2013-10-02 (×7): 2 [IU] via SUBCUTANEOUS
  Administered 2013-10-03: 3 [IU] via SUBCUTANEOUS
  Administered 2013-10-03 (×2): 2 [IU] via SUBCUTANEOUS

## 2013-09-17 MED ORDER — PIPERACILLIN-TAZOBACTAM 3.375 G IVPB 30 MIN: 3.3750 g | INTRAVENOUS | Status: DC

## 2013-09-17 MED ORDER — SODIUM CHLORIDE 0.9 % IV BOLUS (SEPSIS)
2000.0000 mL | Freq: Once | INTRAVENOUS | Status: AC
  Administered 2013-09-17: 2000 mL via INTRAVENOUS

## 2013-09-17 MED ORDER — INSULIN ASPART 100 UNIT/ML ~~LOC~~ SOLN: 0.0000 [IU] | Freq: Every day | SUBCUTANEOUS | Status: DC

## 2013-09-17 MED ORDER — ASPIRIN EC 81 MG PO TBEC
81.0000 mg | DELAYED_RELEASE_TABLET | Freq: Every day | ORAL | Status: DC
  Administered 2013-09-17 – 2013-09-25 (×8): 81 mg via ORAL
  Filled 2013-09-17 (×10): qty 1

## 2013-09-17 MED ORDER — INSULIN ASPART 100 UNIT/ML ~~LOC~~ SOLN: 0.0000 [IU] | Freq: Three times a day (TID) | SUBCUTANEOUS | Status: DC

## 2013-09-17 MED ORDER — LORAZEPAM 2 MG/ML IJ SOLN
1.0000 mg | INTRAMUSCULAR | Status: DC | PRN
  Administered 2013-09-17: 1 mg via INTRAVENOUS
  Filled 2013-09-17 (×2): qty 1

## 2013-09-17 MED ORDER — CETYLPYRIDINIUM CHLORIDE 0.05 % MT LIQD
7.0000 mL | Freq: Two times a day (BID) | OROMUCOSAL | Status: DC
  Administered 2013-09-17 – 2013-09-19 (×6): 7 mL via OROMUCOSAL

## 2013-09-17 MED ORDER — CLONAZEPAM 0.5 MG PO TABS
2.0000 mg | ORAL_TABLET | Freq: Three times a day (TID) | ORAL | Status: DC
  Administered 2013-09-17 – 2013-09-22 (×15): 2 mg via ORAL
  Filled 2013-09-17 (×15): qty 4

## 2013-09-17 MED ORDER — SODIUM CHLORIDE 0.9 % IV SOLN
1500.0000 mg | INTRAVENOUS | Status: AC
  Administered 2013-09-17: 1500 mg via INTRAVENOUS
  Filled 2013-09-17: qty 1500

## 2013-09-17 MED ORDER — AMANTADINE HCL 100 MG PO CAPS
100.0000 mg | ORAL_CAPSULE | Freq: Two times a day (BID) | ORAL | Status: DC
  Administered 2013-09-17 – 2013-09-25 (×17): 100 mg via ORAL
  Filled 2013-09-17 (×21): qty 1

## 2013-09-17 MED ORDER — VANCOMYCIN HCL IN DEXTROSE 1-5 GM/200ML-% IV SOLN: 1000.0000 mg | Freq: Once | INTRAVENOUS | Status: DC

## 2013-09-17 MED ORDER — VANCOMYCIN HCL IN DEXTROSE 1-5 GM/200ML-% IV SOLN
1000.0000 mg | INTRAVENOUS | Status: DC
  Administered 2013-09-18: 1000 mg via INTRAVENOUS
  Filled 2013-09-17: qty 200

## 2013-09-17 MED ORDER — PIPERACILLIN-TAZOBACTAM 3.375 G IVPB 30 MIN
3.3750 g | Freq: Once | INTRAVENOUS | Status: AC
Start: 2013-09-17 — End: 2013-09-17
  Administered 2013-09-17: 3.375 g via INTRAVENOUS

## 2013-09-17 NOTE — Progress Notes (Signed)
PULMONARY / CRITICAL CARE MEDICINE   Name: [name] MRN: [id number] DOB: 09/11/1950    ADMISSION DATE:  08/29/2013 CONSULTATION DATE:  8/28  REFERRING MD :  Dema Severin    CHIEF COMPLAINT:   Abnormal movements   INITIAL PRESENTATION:  63 y/o woman with DM2, CHF, and 3-week of choreiform movements presents 8/28 w/ worsening involuntary movements, metabolic acidosis, Rhabdo and AKI.   STUDIES:    SIGNIFICANT EVENTS:  SUBJECTIVE:  Now sedated  VITAL SIGNS: Temp:  [98.7 F (37.1 C)-100.7 F (38.2 C)] 98.7 F (37.1 C) (08/29 0145) Pulse Rate:  [54-144] 125 (08/29 0700) Resp:  [13-56] 35 (08/29 0700) BP: (89-148)/(45-110) 148/94 mmHg (08/29 0600) SpO2:  [90 %-100 %] 100 % (08/29 0700) Weight:  [75.3 kg (166 lb 0.1 oz)] 75.3 kg (166 lb 0.1 oz) (08/29 0145) HEMODYNAMICS:   VENTILATOR SETTINGS:   INTAKE / OUTPUT:  Intake/Output Summary (Last 24 hours) at 09/17/13 0747 Last data filed at 09/17/13 0700  Gross per 24 hour  Intake 3065.33 ml  Output    435 ml  Net 2630.33 ml    PHYSICAL EXAMINATION: General: Middle aged woman with uncontrollable movements lying in the bed.  Neuro: Awake, conversant, fully oriented. Continuous movements of upper and lower extremities.  HEENT: MMM  Neck: No JVD  Cardiovascular: No M/R/G  Lungs: CTAB  Abdomen: Non-distedned  Musculoskeletal: Intact  Skin: No rash.  LABS:  CBC  Recent Labs Lab 09/12/13 0325 09/12/13 1300 08/24/2013 1930 09/17/13 0235  WBC 8.6  --  21.0* 20.7*  HGB 8.0* 8.8* 7.7* 8.8*  HCT 25.0* 28.3* 25.1* 28.4*  PLT 322  --  735* 581*   Coag's No results found for this basename: APTT, INR,  in the last 168 hours BMET  Recent Labs Lab 08/21/2013 1930 09/17/13 0235 09/17/13 0550  NA 140 141 141  K 4.8 4.3 4.7  CL 102 103 108  CO2 13* 20 17*  BUN 34* 34* 33*  CREATININE 2.00* 1.83* 1.75*  GLUCOSE 161* 154* 175*   Electrolytes  Recent Labs Lab 09/18/2013 1930 09/17/13 0235 09/17/13 0550   CALCIUM 9.7 8.9 8.0*  MG  --  2.2 2.1  PHOS  --  4.3 3.9   Sepsis Markers  Recent Labs Lab 09/01/2013 1936 09/17/13 0235  LATICACIDVEN 8.22* 2.2   ABG  Recent Labs Lab 08/28/2013 2328  PHART 7.370  PCO2ART 35.6  PO2ART 78.0*   Liver Enzymes  Recent Labs Lab 09/11/2013 1930 09/17/13 0235  AST 144* 121*  ALT 71* 69*  ALKPHOS 168* 164*  BILITOT 0.4 0.4  ALBUMIN 2.4* 2.4*   Cardiac Enzymes  Recent Labs Lab 09/05/2013 1930 09/11/2013 2025 09/17/13 0235  TROPONINI <0.30  --  <0.30  PROBNP  --  4061.0*  --    Glucose  Recent Labs Lab 09/13/13 1632 09/13/13 2016 09/14/13 0801 09/14/13 1156 09/14/13 1246 09/17/13 0147  GLUCAP 189* 101* 117* 105* 80 145*    Imaging Dg Chest Port 1 View  08/28/2013   CLINICAL DATA:  63 year old female with fever. Initial encounter.  EXAM: PORTABLE CHEST - 1 VIEW  COMPARISON:  09/14/2013 and earlier.  FINDINGS: Portable AP view at 2048 hrs. Stable lung volumes with mild to moderate elevation of the right hemidiaphragm. Stable cardiomegaly and mediastinal contours. Chronic retrocardiac streaky opacity most resembles atelectasis and is stable. No areas of worsening ventilation. No pneumothorax, pulmonary edema, or pleural effusion identified. Visualized tracheal air column is within normal limits.  IMPRESSION: Chronic retrocardiac atelectasis.  No acute cardiopulmonary abnormality.   Electronically Signed   By: Lars Pinks M.D.   On: 08/26/2013 21:07     ASSESSMENT / PLAN:  PULMONARY OETT A:  Tachypnea in setting of metabolic acidosis P:   Pulse ox Close obs to avoid resp suppression   CARDIOVASCULAR CVL A:  SIRS  Chronic systolic HF (appears compensated currently) P:  Cont IVFs   RENAL A:  AKI: improving  prob mild Rhabdo  Anion gap acidosis almost certainly lactic acidosis from he movement d/o -->this is worse.   P:   Repeat lactic acid Ck urine myoglobin  Avoid hypotension Trend  chemistry    GASTROINTESTINAL A:   Abnormal LFTs P:   NPO w/ possibility of intubation   HEMATOLOGIC A:   Anemia  No evidence of bleeding hgb 7.7-->8.8 w/out intervention. ? Lab error P:  Repeat and Trend cbc  PAS, hold Bryson heparin   INFECTIOUS A:  SIRS. No clear source of infection  P:   BCx2 8/28>>> UC 8/28>>> Abx: vanc, start date 8/28, day 1/X Abx: zosyn start date 8/28, day 1/X  ENDOCRINE A:   Diabetes  P:   q 4 hr CBC w/ ssi   NEUROLOGIC A:   hyperkinetic choreiform-ballistic movement disorder >Neurology following. Etiology remains unclear: distal symmetrical sensory-motor diabetic neuropathy, huntington's, toxic-metabolic, vascular, infectious/inflammatory, autoimmune??? >TSH nml  P:   RASS goal: -1 Scheduled Clonazepam F/u Ana, anticardiolipin antibodies,T3,T4, copper, ceruloplasmin, ACE level, HIV, and antineuronal antibodies PRN Ativan  If Neuro wants LP and MRI would require intubation, sedation +/- neuromuscular blockade.   TODAY'S SUMMARY:  63 y/o woman with acute movement disorder and metabolic disarray due to lactic aciodosis. We will cont supportive care and await further neuro guidance. May need intubation/sedation to facilitate MRI/ LP.   I have personally obtained a history, examined the patient, evaluated laboratory and imaging results, formulated the assessment and plan and placed orders. CRITICAL CARE: The patient is critically ill with multiple organ systems failure and requires high complexity decision making for assessment and support, frequent evaluation and titration of therapies, application of advanced monitoring technologies and extensive interpretation of multiple databases. Critical Care Time devoted to patient care services described in this note is 35 minutes.   Rush Farmer, M.D. [facility]. Pager: [phone]. After hours pager: [phone].  09/17/2013, 7:47 AM

## 2013-09-17 NOTE — Progress Notes (Addendum)
ANTIBIOTIC CONSULT NOTE - INITIAL  Pharmacy Consult for vancomycin and zosyn   Indication: sepsis  Allergies  Allergen Reactions  . Codeine Nausea And Vomiting  . Morphine And Related Other (See Comments)    Extreme sedation  . Peanut-Containing Drug Products Nausea And Vomiting  . Grapefruit Concentrate Itching    Patient Measurements:   Adjusted Body Weight:   Vital Signs: Temp: 99 F (37.2 C) (08/28 2046) Temp src: Rectal (08/28 2046) BP: 132/69 mmHg (08/29 0030) Pulse Rate: 113 (08/29 0030) Intake/Output from previous day: 08/28 0701 - 08/29 0700 In: 500 [IV EPPIRJJOA:416] Out: -  Intake/Output from this shift: Total I/O In: 500 [IV Piggyback:500] Out: -   Labs:  Recent Labs  09/14/13 0810 09/05/2013 1930  WBC  --  21.0*  HGB  --  7.7*  PLT  --  735*  CREATININE 1.81* 2.00*   The CrCl is unknown because both a height and weight (above a minimum accepted value) are required for this calculation. No results found for this basename: VANCOTROUGH, Corlis Leak, VANCORANDOM, [place], GENTPEAK, [place], [place], TOBRAPEAK, TOBRARND, AMIKACINPEAK, AMIKACINTROU, AMIKACIN,  in the last 72 hours   Microbiology: Recent Results (from the past 720 hour(s))  URINE CULTURE     Status: None   Collection Time    09/05/13  7:55 PM      Result Value Ref Range Status   Specimen Description URINE, RANDOM   Final   Special Requests NONE   Final   Culture  Setup Time     Final   Value: 09/05/2013 23:24     Performed at [place] [facility]     Final   Value: >=100,000 COLONIES/ML     Performed at [facility]   Culture     Final   Value: KLEBSIELLA PNEUMONIAE     Performed at [facility]   Report Status 09/08/2013 FINAL   Final   Organism ID, Bacteria KLEBSIELLA PNEUMONIAE   Final  CULTURE, BLOOD (ROUTINE X 2)     Status: None   Collection Time    09/06/13  9:10 AM      Result Value Ref Range Status   Specimen Description BLOOD  RIGHT ANTECUBITAL   Final   Special Requests BOTTLES DRAWN AEROBIC AND ANAEROBIC 5CC   Final   Culture  Setup Time     Final   Value: 09/06/2013 14:35     Performed at [facility]   Culture     Final   Value: NO GROWTH 5 DAYS     Performed at [facility]   Report Status 09/12/2013 FINAL   Final  CULTURE, BLOOD (ROUTINE X 2)     Status: None   Collection Time    09/06/13  9:15 AM      Result Value Ref Range Status   Specimen Description BLOOD RIGHT HAND   Final   Special Requests BOTTLES DRAWN AEROBIC ONLY [facility]   Final   Culture  Setup Time     Final   Value: 09/06/2013 14:35     Performed at [facility]   Culture     Final   Value: NO GROWTH 5 DAYS     Performed at [facility]   Report Status 09/12/2013 FINAL   Final    Medical History: Past Medical History  Diagnosis Date  . Hypertension   . Diabetes mellitus   . Hyperlipidemia   . History of noncompliance with  medical treatment   . Shingles 08/18/2013    Medications:   (Not in a hospital admission) Assessment: Probable sepsis with ARF srcr of 2 with est crcl using CG eq is 64ml/min. Pt being assessed by critical care. vanc and zosyn for empiric coverage.  Goal of Therapy:  Vancomycin trough level 15-20 mcg/ml  Plan:  Zosyn 3,375gm q8h 1st dose over 30 min Vancomycin 1500mg x1 and f.u trending renal function with am labs. \  Connye Burkitt 09/17/2013,12:45 AM    ===================================   Addendum: - renal function has been fluctuating, but currently trending down   Plan: - Vanc 1gm IV Q24H - Monitor renal fxn, clinical course, vanc trough as indicated     Vincent Streater D. Mina Marble, PharmD, BCPS Pager:  [phone] 09/17/2013, 10:14 AM

## 2013-09-17 NOTE — Progress Notes (Signed)
UR Completed.  Carel Schnee Jane [phone] 09/17/2013  

## 2013-09-17 NOTE — H&P (Signed)
PULMONARY / CRITICAL CARE MEDICINE HISTORY AND PHYSICAL EXAMINATION   Name: [name] MRN: [id number] DOB: [birth year]    ADMISSION DATE:  09/13/2013  PRIMARY SERVICE: PCCM  CHIEF COMPLAINT:  Dyspnea  BRIEF PATIENT DESCRIPTION: 63 y/o woman with DM2, CHF, and 3-week of choreiform movements presents with dyspnea.  SIGNIFICANT EVENTS / STUDIES:  HCO3: 13 (Gap: 25, Lactic acid 8.22) CK: 9800 Cr: 2.00   LINES / TUBES: PIVs  CULTURES: Blood x2 8/28 Urine  8/28  ANTIBIOTICS: Zosyn Vanc  HISTORY OF PRESENT ILLNESS:   [name] is a 62 y/o woman with a history of HTN, HLD, CHF, CKD as well as a three week history of uncontrollable choreiform movements which have been quite alarming and disruptive for her and her family. She was admitted twice for a workup of the same with no apparent identification of the source of her symptoms. She has had an MRI which was unrevealing. She has also developed worsening dyspnea over the last few weeks. On the day of admission, the patient's family reports progressive dyspnea to frank respiratory distress with tachypnea and increased WOB. In the ED, she was found to have AKI, gap metabolic acidosis due to renal failure and lactic acidosis. She was not hypoxic. She does have DM, but reports having stopped metformin several weeks ago.  PAST MEDICAL HISTORY :  Past Medical History  Diagnosis Date  . Hypertension   . Diabetes mellitus   . Hyperlipidemia   . History of noncompliance with medical treatment   . Shingles 08/18/2013   Past Surgical History  Procedure Laterality Date  . Diabetes    . Fibroids of breast     Prior to Admission medications   Medication Sig Start Date End Date Taking? Authorizing Provider  amLODipine-olmesartan (AZOR) 10-40 MG per tablet Take 1 tablet by mouth daily. 08/29/13  Yes Tresa Garter, MD  aspirin EC 81 MG tablet Take 1 tablet (81 mg total) by mouth daily. 08/20/13  Yes Thurnell Lose, MD   atorvastatin (LIPITOR) 10 MG tablet Take 1 tablet (10 mg total) by mouth daily at 6 PM. 08/20/13  Yes Thurnell Lose, MD  benztropine (COGENTIN) 0.5 MG tablet Take 1 tablet (0.5 mg total) by mouth 2 (two) times daily. 09/14/13  Yes Kelvin Cellar, MD  carvedilol (COREG) 6.25 MG tablet Take 1 tablet (6.25 mg total) by mouth 2 (two) times daily with a meal. 08/20/13  Yes Thurnell Lose, MD  clonazePAM (KLONOPIN) 1 MG tablet Take 1 tablet (1 mg total) by mouth 2 (two) times daily. 09/12/13  Yes Donne Hazel, MD  escitalopram (LEXAPRO) 5 MG tablet Take 5 mg by mouth at bedtime.   Yes Historical Provider, MD  furosemide (LASIX) 20 MG tablet Take 20 mg by mouth every morning.   Yes Historical Provider, MD  insulin aspart (NOVOLOG) 100 UNIT/ML injection Inject 0-10 Units into the skin 3 (three) times daily with meals. Before each meal 3 times a day, 140-199 - 2 units, 200-250 - 4 units, 251-299 - 6 units,  300-349 - 8 units,  350 or above 10 units. Insulin PEN if approved, provide syringes and needles if needed. 08/20/13  Yes Thurnell Lose, MD  insulin glargine (LANTUS) 100 UNIT/ML injection Inject 0.08 mLs (8 Units total) into the skin 2 (two) times daily. 09/14/13  Yes Kelvin Cellar, MD  naproxen sodium (ANAPROX) 220 MG tablet Take 220 mg by mouth daily as needed (for fever).   Yes Historical Provider,  MD  polyethylene glycol (MIRALAX / GLYCOLAX) packet Take 17 g by mouth daily. 09/14/13  Yes Kelvin Cellar, MD  Tetrahydrozoline HCl (VISINE OP) Place 1 drop into both eyes daily as needed (dry eyes).   Yes Historical Provider, MD   Allergies  Allergen Reactions  . Codeine Nausea And Vomiting  . Morphine And Related Other (See Comments)    Extreme sedation  . Peanut-Containing Drug Products Nausea And Vomiting  . Grapefruit Concentrate Itching    FAMILY HISTORY:  Family History  Problem Relation Age of Onset  . Diabetes    . Hypertension    . Hypertension Father   . Diabetes Father   .  Hypertension Mother   . Diabetes Mother   . Diabetes Sister   . Hypertension Sister   . Diabetes Sister   . Hypertension Sister    SOCIAL HISTORY:  reports that she has never smoked. She has never used smokeless tobacco. She reports that she does not drink alcohol or use illicit drugs.  REVIEW OF SYSTEMS:  10 systems were reviewed and are negative except as mentioned in the HPI.  SUBJECTIVE:   VITAL SIGNS: Temp:  [99 F (37.2 C)-100.7 F (38.2 C)] 99 F (37.2 C) (08/28 2046) Pulse Rate:  [95-144] 110 (08/28 2345) Resp:  [15-46] 30 (08/28 2345) BP: (89-146)/(45-85) 141/74 mmHg (08/28 2345) SpO2:  [90 %-100 %] 100 % (08/28 2345) HEMODYNAMICS:   VENTILATOR SETTINGS:   INTAKE / OUTPUT: Intake/Output     08/28 0701 - 08/29 0700   IV Piggyback 500   Total Intake 500   Net +500         PHYSICAL EXAMINATION: General:  Middle aged woman with uncontrollable movements lying in the bed. Neuro:  Awake, conversant, fully oriented. Continuous movements of upper and lower extremities. HEENT:  MMM Neck: No JVD Cardiovascular:  No M/R/G Lungs:  CTAB Abdomen:  Non-distedned Musculoskeletal:  Intact Skin:  No rash.  LABS:  CBC  Recent Labs Lab 09/12/13 0325 09/12/13 1300 09/13/2013 1930  WBC 8.6  --  21.0*  HGB 8.0* 8.8* 7.7*  HCT 25.0* 28.3* 25.1*  PLT 322  --  735*   Coag's No results found for this basename: APTT, INR,  in the last 168 hours BMET  Recent Labs Lab 09/13/13 0530 09/14/13 0810 09/02/2013 1930  NA 140 141 140  K 4.3 4.2 4.8  CL 106 103 102  CO2 24 24 13*  BUN 21 22 34*  CREATININE 1.71* 1.81* 2.00*  GLUCOSE 138* 112* 161*   Electrolytes  Recent Labs Lab 09/13/13 0530 09/14/13 0810 09/11/2013 1930  CALCIUM 9.2 9.2 9.7   Sepsis Markers  Recent Labs Lab 08/31/2013 1936  LATICACIDVEN 8.22*   ABG  Recent Labs Lab 08/27/2013 2328  PHART 7.370  PCO2ART 35.6  PO2ART 78.0*   Liver Enzymes  Recent Labs Lab 09/15/2013 1930  AST 144*   ALT 71*  ALKPHOS 168*  BILITOT 0.4  ALBUMIN 2.4*   Cardiac Enzymes  Recent Labs Lab 08/28/2013 1930 08/23/2013 2025  TROPONINI <0.30  --   PROBNP  --  4061.0*   Glucose  Recent Labs Lab 09/13/13 1112 09/13/13 1632 09/13/13 2016 09/14/13 0801 09/14/13 1156 09/14/13 1246  GLUCAP 120* 189* 101* 117* 105* 80    Imaging Dg Chest Port 1 View  08/28/2013   CLINICAL DATA:  64 year old female with fever. Initial encounter.  EXAM: PORTABLE CHEST - 1 VIEW  COMPARISON:  09/14/2013 and earlier.  FINDINGS: Portable AP view  at 2048 hrs. Stable lung volumes with mild to moderate elevation of the right hemidiaphragm. Stable cardiomegaly and mediastinal contours. Chronic retrocardiac streaky opacity most resembles atelectasis and is stable. No areas of worsening ventilation. No pneumothorax, pulmonary edema, or pleural effusion identified. Visualized tracheal air column is within normal limits.  IMPRESSION: Chronic retrocardiac atelectasis. No acute cardiopulmonary abnormality.   Electronically Signed   By: Lars Pinks M.D.   On: 09/07/2013 21:07    EKG: unable to assess due to noise CXR: Significant mediastinal fullness; cannot assess whether this represents vessels or hilar lymphadenopathy.  ASSESSMENT / PLAN:  Active Problems:   Rhabdomyolysis   Sepsis   PULMONARY A: Tachypnea due to metabolic acidosis; appropriate respiratory compensation. P:   At this time, the patient appears to have normal or near-normal gas exchange. If she does need mechanical ventilation , sh will need a high minute ventilation to maintain her pH  In a normal or near-normal range.  CARDIOVASCULAR A: Chronic systolic heart failure P:   Will need judicious use of IVF. Appears euvolemic at present.  RENAL A: AKI on CKD P:   Unclear trigger; possibly due to underlying cause of choreiform disorder. Will give trial of IVF.  GASTROINTESTINAL A: No acute issues. P:   Feed patient when able to  swallow.  HEMATOLOGIC A: Leukocytosis Anemia P:   Favor representative of body's underlying cause of stress, be it sepsis or rheumatalgic. Will work up anemia as well.  INFECTIOUS A: Possible severe sepsis, source unknown P:   Given sharp decline in lab values, needs to be considered that the patient had a superimposed infection on whatever process has been going on for the last few weeks.  ENDOCRINE A: DM2 P:   ICU glycemic protocol.  NEUROLOGIC A: Choreiform movements P:   Some dystonia or hyperflexia seems reasonable, but at this point, defer to neurology. Appreciate input.  BEST PRACTICE / DISPOSITION Level of Care:  ICU Primary Service:  PCCM Consultants:  Neurology Code Status:  Full Diet:  NPO DVT Px:  Heparin GI Px:  none Skin Integrity:  No tend Social / Family:  No trend  TODAY'S SUMMARY: 63 y/o woman with acute movement disorder and metabolic disarray due to lactic aciodosis.  I have personally obtained a history, examined the patient, evaluated laboratory and imaging results, formulated the assessment and plan and placed orders.  CRITICAL CARE: The patient is critically ill with multiple organ systems failure and requires high complexity decision making for assessment and support, frequent evaluation and titration of therapies, application of advanced monitoring technologies and extensive interpretation of multiple databases. Critical Care Time devoted to patient care services described in this note is 120 minutes.   Luz Brazen, MD Pulmonary & Critical Care Medicine September 17, 2013, 8:04 AM    09/17/2013, 12:23 AM

## 2013-09-17 NOTE — Consult Note (Addendum)
NEURO HOSPITALIST CONSULT NOTE    Reason for Consult: abnormal movements  HPI:                                                                                                                                          [name] is an 63 y.o. female with a past medical history significant for HTN, DM, peripheral neuropathy presumably secondary to DM, hyperlipidemia, brought in by family due to worsening violent body movements. 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 Neurology was consulted again on 8/19 when patient was admitted with increasing facial movements in addition to bilateral UE and LE movements thought to be related to TD and klonopin was recommended. In addition, she was evaluated by psychiatry who added benztropine.  She returns to the ED today with family stating that for the past 2 weeks she has been having nearly constant generalized violent body movements that abate only during sleep. Family also dad that she is mentally clear and able to eat and walk around the house. At the time of this assessment patient is awake, oriented x4, follows commands, but exhibits nearly constant, violent, involuntary, irregular, purposeless, generalized body movements that flow into one another in a very random fashion. Received total of 2 mg IV ativan in the ED which caused sedation and produced brief symptomatic control. No reported use of antipsychotics. Gabapentin was recently discontinued. On low dose lexapro. No family history of movement disorders. Denies HA, vertigo, double vision, focal weakness, visual changes, language or speech impairment. Upon arrival to ED she was diaphoretic  and felt warm. She was febrile initially. Serologies significant for CPK of 9082, Cr 2.0, WBC 21,000, AST 144, alt 71, lactic acid 8.22 Glucose 115, Ca 8.8  Past Medical History  Diagnosis Date  . Hypertension   . Diabetes mellitus   . Hyperlipidemia   . History of noncompliance with medical treatment   . Shingles 08/18/2013    Past Surgical History  Procedure Laterality Date  . Diabetes    . Fibroids of breast      Family History  Problem Relation Age of Onset  . Diabetes    . Hypertension    . Hypertension Father   . Diabetes Father   . Hypertension Mother   . Diabetes Mother   . Diabetes Sister   . Hypertension Sister   . Diabetes Sister   . Hypertension Sister     Social History:  reports that she has never smoked. She has never used smokeless tobacco. She reports  that she does not drink alcohol or use illicit drugs.  Allergies  Allergen Reactions  . Codeine Nausea And Vomiting  . Morphine And Related Other (See Comments)    Extreme sedation  . Peanut-Containing Drug Products Nausea And Vomiting  . Grapefruit Concentrate Itching    MEDICATIONS:                                                                                                                     I have reviewed the patient's current medications.   ROS:                                                                                                                                       History obtained from chart review and family.  General ROS: negative for - chills, fatigue, weight gain or weight loss Psychological ROS: negative for - behavioral disorder, hallucinations, memory difficulties,  or suicidal ideation Ophthalmic ROS: negative for - blurry vision, double vision, eye pain or loss of vision ENT ROS: negative for - epistaxis, nasal discharge, oral lesions, sore throat, tinnitus or vertigo Allergy and Immunology ROS: negative for - hives or itchy/watery eyes Hematological and Lymphatic  ROS: negative for - bleeding problems, bruising or swollen lymph nodes Endocrine ROS: negative for - galactorrhea, hair pattern changes, polydipsia/polyuria or temperature intolerance Respiratory ROS: negative for - cough, hemoptysis, shortness of breath or wheezing Cardiovascular ROS: negative for - chest pain, dyspnea on exertion, edema or irregular heartbeat Gastrointestinal ROS: negative for - abdominal pain, diarrhea, hematemesis, nausea/vomiting or stool incontinence Genito-Urinary ROS: negative for - dysuria, hematuria, incontinence or urinary frequency/urgency Musculoskeletal ROS: negative for - joint swelling Neurological ROS: as noted in HPI Dermatological ROS: negative for rash and skin lesion changes  Physical exam: patient in distress with abnormal generalized body movements. Blood pressure 132/69, pulse 113, temperature 99 F (37.2 C), temperature source Rectal, resp. rate 19, SpO2 100.00%. Head: normocephalic. Neck: supple, no bruits, no JVD. Cardiac: no murmurs. Lungs: clear. Abdomen: soft, no tender, no mass. Extremities: no edema. Neurologic Examination:  General: Mental Status: Alert, oriented x 4, thought content appropriate.  Speech fluent without evidence of aphasia.  Able to follow 3 step commands without difficulty. Cranial Nerves: II: Discs couldn't be assessed ; Visual fields can not be formally assess. , pupils equal, round, reactive to light. III,IV, VI: ptosis not present, extra-ocular motions intact bilaterally V,VII: smile symmetric, facial light touch sensation no tested VIII: hearing grossly normal bilaterally IX,X: gag reflex present XI: bilateral shoulder shrug was not tested XII: midline tongue Motor: violent, involuntary, irregular, purposeless, generalized body movements that flow into one another in a very random fashion. Tone and bulk:normal tone  throughout; no atrophy noted Sensory: no tested Deep Tendon Reflexes:  Right: Upper Extremity   Left: Upper extremity   biceps (C-5 to C-6) 2/4   biceps (C-5 to C-6) 2/4 tricep (C7) 2/4    triceps (C7) 2/4 Brachioradialis (C6) 2/4  Brachioradialis (C6) 2/4  Lower Extremity Lower Extremity  quadriceps (L-2 to L-4) 2/4   quadriceps (L-2 to L-4) 2/4 Achilles (S1) 2/4   Achilles (S1) 2/4  Plantars: Right: downgoing   Left: downgoing Cerebellar:  Unable to test Gait: Unable to test    Lab Results  Component Value Date/Time   CHOL 287* 08/18/2013  2:44 AM    Results for orders placed during the hospital encounter of 08/29/2013 (from the past 48 hour(s))  CBC WITH DIFFERENTIAL     Status: Abnormal   Collection Time    09/18/2013  7:30 PM      Result Value Ref Range   WBC 21.0 (*) 4.0 - 10.5 K/uL   RBC 2.76 (*) 3.87 - 5.11 MIL/uL   Hemoglobin 7.7 (*) 12.0 - 15.0 g/dL   HCT 25.1 (*) 36.0 - 46.0 %   MCV 90.9  78.0 - 100.0 fL   MCH 27.9  26.0 - 34.0 pg   MCHC 30.7  30.0 - 36.0 g/dL   RDW 14.4  11.5 - 15.5 %   Platelets 735 (*) 150 - 400 K/uL   Neutrophils Relative % 69  43 - 77 %   Neutro Abs 14.5 (*) 1.7 - 7.7 K/uL   Lymphocytes Relative 24  12 - 46 %   Lymphs Abs 4.9 (*) 0.7 - 4.0 K/uL   Monocytes Relative 6  3 - 12 %   Monocytes Absolute 1.3 (*) 0.1 - 1.0 K/uL   Eosinophils Relative 1  0 - 5 %   Eosinophils Absolute 0.1  0.0 - 0.7 K/uL   Basophils Relative 0  0 - 1 %   Basophils Absolute 0.0  0.0 - 0.1 K/uL  COMPREHENSIVE METABOLIC PANEL     Status: Abnormal   Collection Time    08/21/2013  7:30 PM      Result Value Ref Range   Sodium 140  137 - 147 mEq/L   Potassium 4.8  3.7 - 5.3 mEq/L   Chloride 102  96 - 112 mEq/L   CO2 13 (*) 19 - 32 mEq/L   Glucose, Bld 161 (*) 70 - 99 mg/dL   BUN 34 (*) 6 - 23 mg/dL   Creatinine, Ser 2.00 (*) 0.50 - 1.10 mg/dL   Calcium 9.7  8.4 - 10.5 mg/dL   Total Protein 7.0  6.0 - 8.3 g/dL   Albumin 2.4 (*) 3.5 - 5.2 g/dL   AST 144 (*) 0 -  37 U/L   ALT 71 (*) 0 - 35 U/L   Alkaline Phosphatase 168 (*) 39 - 117 U/L  Total Bilirubin 0.4  0.3 - 1.2 mg/dL   GFR calc non Af Amer 25 (*) >90 mL/min   GFR calc Af Amer 29 (*) >90 mL/min   Comment: (NOTE)     The eGFR has been calculated using the CKD EPI equation.     This calculation has not been validated in all clinical situations.     eGFR's persistently <90 mL/min signify possible Chronic Kidney     Disease.   Anion gap 25 (*) 5 - 15  CK     Status: Abnormal   Collection Time    09/19/2013  7:30 PM      Result Value Ref Range   Total CK 9082 (*) 7 - 177 U/L   Comment: RESULT CONFIRMED BY AUTOMATED DILUTION  TROPONIN I     Status: None   Collection Time    08/27/2013  7:30 PM      Result Value Ref Range   Troponin I <0.30  <0.30 ng/mL   Comment:            Due to the release kinetics of cTnI,     a negative result within the first hours     of the onset of symptoms does not rule out     myocardial infarction with certainty.     If myocardial infarction is still suspected,     repeat the test at appropriate intervals.  I-STAT CG4 LACTIC ACID, ED     Status: Abnormal   Collection Time    08/28/2013  7:36 PM      Result Value Ref Range   Lactic Acid, Venous 8.22 (*) 0.5 - 2.2 mmol/L  PRO B NATRIURETIC PEPTIDE     Status: Abnormal   Collection Time    08/20/2013  8:25 PM      Result Value Ref Range   Pro B Natriuretic peptide (BNP) 4061.0 (*) 0 - 125 pg/mL  URINALYSIS, ROUTINE W REFLEX MICROSCOPIC     Status: Abnormal   Collection Time    08/25/2013  8:46 PM      Result Value Ref Range   Color, Urine YELLOW  YELLOW   APPearance CLOUDY (*) CLEAR   Specific Gravity, Urine 1.028  1.005 - 1.030   pH 5.0  5.0 - 8.0   Glucose, UA NEGATIVE  NEGATIVE mg/dL   Hgb urine dipstick LARGE (*) NEGATIVE   Bilirubin Urine SMALL (*) NEGATIVE   Ketones, ur NEGATIVE  NEGATIVE mg/dL   Protein, ur >300 (*) NEGATIVE mg/dL   Urobilinogen, UA 0.2  0.0 - 1.0 mg/dL   Nitrite NEGATIVE  NEGATIVE    Leukocytes, UA NEGATIVE  NEGATIVE  URINE MICROSCOPIC-ADD ON     Status: Abnormal   Collection Time    08/31/2013  8:46 PM      Result Value Ref Range   Squamous Epithelial / LPF RARE  RARE   WBC, UA 0-2  <3 WBC/hpf   RBC / HPF 3-6  <3 RBC/hpf   Bacteria, UA RARE  RARE   Casts HYALINE CASTS (*) NEGATIVE   Comment: GRANULAR CAST  I-STAT ARTERIAL BLOOD GAS, ED     Status: Abnormal   Collection Time    09/15/2013 11:28 PM      Result Value Ref Range   pH, Arterial 7.370  7.350 - 7.450   pCO2 arterial 35.6  35.0 - 45.0 mmHg   pO2, Arterial 78.0 (*) 80.0 - 100.0 mmHg   Bicarbonate 20.3  20.0 - 24.0 mEq/L  TCO2 21  0 - 100 mmol/L   O2 Saturation 94.0     Acid-base deficit 4.0 (*) 0.0 - 2.0 mmol/L   Patient temperature 100.7 F     Collection site RADIAL, ALLEN'S TEST ACCEPTABLE     Drawn by Operator     Sample type ARTERIAL      Dg Chest Port 1 View  08/24/2013   CLINICAL DATA:  63 year old female with fever. Initial encounter.  EXAM: PORTABLE CHEST - 1 VIEW  COMPARISON:  09/14/2013 and earlier.  FINDINGS: Portable AP view at 2048 hrs. Stable lung volumes with mild to moderate elevation of the right hemidiaphragm. Stable cardiomegaly and mediastinal contours. Chronic retrocardiac streaky opacity most resembles atelectasis and is stable. No areas of worsening ventilation. No pneumothorax, pulmonary edema, or pleural effusion identified. Visualized tracheal air column is within normal limits.  IMPRESSION: Chronic retrocardiac atelectasis. No acute cardiopulmonary abnormality.   Electronically Signed   By: Lars Pinks M.D.   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 No family history of movement disorders or exposure to antipsychotics.  Broad differential (toxic-metabolic, vascular, infectious/inflammatory, autoimmune) . Unfortunately, MRI brain and LP won't be feasible at this moment due to  severity of patient's violent movements. Will check ANA, anticardiolipin antibodies, TSH, T3,T4, copper, ceruloplasmin, ACE level, HIV, and antineuronal antibodies. Her syndrome doesn't seem to be consistent with acute chorea in the setting of anti NMDA-R or antiGAD-R encephalitis and thus will not suggest testing for those antibodies at this moment.  Although family doesn't report a history of movement disorder, Huntington disease should also be in the differential     CPK >9,000 with leukocytosis but afebrile and without rigidity, altered mental status or dysautonomia to suggest NMS (rhabdomyolysis caused by generalized chorea has been previously described).  Can not use risperidone or haloperidol for symptomatic chorea treatment due to rhabdomyolysis, and tetrabenazine is not available.  Increase clonazepam to 1 mg TID. Will follow up.    Dorian Pod, MD 09/17/2013, 1:26 AM [facility]

## 2013-09-18 DIAGNOSIS — R259 Unspecified abnormal involuntary movements: Secondary | ICD-10-CM

## 2013-09-18 LAB — BASIC METABOLIC PANEL
ANION GAP: 14 (ref 5–15)
BUN: 26 mg/dL — ABNORMAL HIGH (ref 6–23)
CHLORIDE: 109 meq/L (ref 96–112)
CO2: 21 meq/L (ref 19–32)
CREATININE: 1.55 mg/dL — AB (ref 0.50–1.10)
Calcium: 8.8 mg/dL (ref 8.4–10.5)
GFR calc Af Amer: 40 mL/min — ABNORMAL LOW (ref >90)
GFR calc non Af Amer: 34 mL/min — ABNORMAL LOW (ref >90)
GLUCOSE: 138 mg/dL — AB (ref 70–99)
POTASSIUM: 4.3 meq/L (ref 3.7–5.3)
Sodium: 144 mEq/L (ref 137–147)

## 2013-09-18 LAB — GLUCOSE, CAPILLARY
GLUCOSE-CAPILLARY: 124 mg/dL — AB (ref 70–99)
GLUCOSE-CAPILLARY: 120 mg/dL — AB (ref 70–99)
Glucose-Capillary: 123 mg/dL — ABNORMAL HIGH (ref 70–99)
Glucose-Capillary: 128 mg/dL — ABNORMAL HIGH (ref 70–99)
Glucose-Capillary: 137 mg/dL — ABNORMAL HIGH (ref 70–99)
Glucose-Capillary: 147 mg/dL — ABNORMAL HIGH (ref 70–99)

## 2013-09-18 LAB — CBC
HEMATOCRIT: 26 % — AB (ref 36.0–46.0)
HEMOGLOBIN: 8.2 g/dL — AB (ref 12.0–15.0)
MCH: 27.7 pg (ref 26.0–34.0)
MCHC: 31.5 g/dL (ref 30.0–36.0)
MCV: 87.8 fL (ref 78.0–100.0)
Platelets: 443 10*3/uL — ABNORMAL HIGH (ref 150–400)
RBC: 2.96 MIL/uL — ABNORMAL LOW (ref 3.87–5.11)
RDW: 14.6 % (ref 11.5–15.5)
WBC: 13.3 10*3/uL — AB (ref 4.0–10.5)

## 2013-09-18 LAB — URINE CULTURE
COLONY COUNT: NO GROWTH
CULTURE: NO GROWTH

## 2013-09-18 LAB — LACTIC ACID, PLASMA: LACTIC ACID, VENOUS: 1.7 mmol/L (ref 0.5–2.2)

## 2013-09-18 NOTE — Progress Notes (Signed)
PULMONARY / CRITICAL CARE MEDICINE   Name: [name] MRN: [id number] DOB: [birth year]    ADMISSION DATE:  08/23/2013 CONSULTATION DATE:  8/28  REFERRING MD :  Dema Severin    CHIEF COMPLAINT:   Abnormal movements   INITIAL PRESENTATION:  63 y/o woman with DM2, CHF, and 3-week of choreiform movements presents 8/28 w/ worsening involuntary movements, metabolic acidosis, Rhabdo and AKI.   STUDIES:    SIGNIFICANT EVENTS:  SUBJECTIVE:  Now sedated  VITAL SIGNS: Temp:  [97.8 F (36.6 C)-99.6 F (37.6 C)] 98.3 F (36.8 C) (08/30 0718) Pulse Rate:  [91-126] 100 (08/30 0700) Resp:  [15-45] 23 (08/30 0700) BP: (98-146)/(49-86) 130/72 mmHg (08/30 0700) SpO2:  [74 %-100 %] 95 % (08/30 0700) Weight:  [76 kg (167 lb 8.8 oz)] 76 kg (167 lb 8.8 oz) (08/30 0452) HEMODYNAMICS:   VENTILATOR SETTINGS:   INTAKE / OUTPUT:  Intake/Output Summary (Last 24 hours) at 09/18/13 0912 Last data filed at 09/18/13 0600  Gross per 24 hour  Intake 1932.5 ml  Output    840 ml  Net 1092.5 ml    PHYSICAL EXAMINATION: General: Middle aged woman with uncontrollable movements lying in the bed.  Neuro: Awake, conversant, fully oriented. Continuous movements of upper and lower extremities.  HEENT: MMM  Neck: No JVD  Cardiovascular: No M/R/G  Lungs: CTAB  Abdomen: Non-distedned  Musculoskeletal: Intact  Skin: No rash.  LABS:  CBC  Recent Labs Lab 09/17/13 0235 09/17/13 1300 09/18/13 0234  WBC 20.7* 13.3* 13.3*  HGB 8.8* 7.8* 8.2*  HCT 28.4* 25.6* 26.0*  PLT 581* 458* 443*   Coag's No results found for this basename: APTT, INR,  in the last 168 hours BMET  Recent Labs Lab 09/17/13 0550 09/17/13 1300 09/18/13 0540  NA 141 142 144  K 4.7 4.7 4.3  CL 108 108 109  CO2 17* 17* 21  BUN 33* 33* 26*  CREATININE 1.75* 1.83* 1.55*  GLUCOSE 175* 163* 138*   Electrolytes  Recent Labs Lab 09/17/13 0235 09/17/13 0550 09/17/13 1300 09/18/13 0540  CALCIUM 8.9 8.0* 8.1* 8.8   MG 2.2 2.1  --   --   PHOS 4.3 3.9  --   --    Sepsis Markers  Recent Labs Lab 09/17/13 0235 09/17/13 1300 09/18/13 0540  LATICACIDVEN 2.2 1.8 1.7   ABG  Recent Labs Lab 09/01/2013 2328  PHART 7.370  PCO2ART 35.6  PO2ART 78.0*   Liver Enzymes  Recent Labs Lab 08/20/2013 1930 09/17/13 0235  AST 144* 121*  ALT 71* 69*  ALKPHOS 168* 164*  BILITOT 0.4 0.4  ALBUMIN 2.4* 2.4*   Cardiac Enzymes  Recent Labs Lab 08/23/2013 1930 08/30/2013 2025 09/17/13 0235  TROPONINI <0.30  --  <0.30  PROBNP  --  4061.0*  --    Glucose  Recent Labs Lab 09/17/13 1124 09/17/13 1752 09/17/13 2000 09/18/13 0030 09/18/13 0336 09/18/13 0710  GLUCAP 157* 139* 132* 137* 123* 124*    Imaging No results found.   ASSESSMENT / PLAN:  PULMONARY OETT A:  Tachypnea in setting of metabolic acidosis-->resolved  P:   Pulse ox   CARDIOVASCULAR CVL A:  SIRS  Chronic systolic HF (appears compensated currently) P:  Cont IVFs Echo 8/30>>>  RENAL A:  AKI: improving  prob mild Rhabdo  Anion gap acidosis almost certainly lactic acidosis from he movement d/o -->resolved   P:   Cont IVFs Avoid hypotension Trend chemistry    GASTROINTESTINAL A:   Abnormal LFTs P:  Adv diet as tol   HEMATOLOGIC A:   Anemia  No evidence of bleeding hgb 7.7-->8.8 w/out intervention. ? Lab error. Holding X last 24hrs  P:  trend cbc  PAS, hold [place] heparin   INFECTIOUS A:  SIRS. No clear source of infection  P:   BCx2 8/28>>> UC 8/28>>> D/c all abx   ENDOCRINE A:   Diabetes  P:   q 4 hr CBC w/ ssi   NEUROLOGIC A:   hyperkinetic choreiform-ballistic movement disorder >Neurology following. Etiology remains unclear: distal symmetrical sensory-motor diabetic neuropathy, huntington's, toxic-metabolic, vascular, infectious/inflammatory, autoimmune??? >TSH nml  P:   RASS goal: 0 to -1 Scheduled Clonazepam F/u Ana, anticardiolipin antibodies,T3,T4, copper, ceruloplasmin, ACE  level, HIV, and antineuronal antibodies PRN Ativan  Defer rest of eval per neuro   TODAY'S SUMMARY:  63 y/o woman with acute movement disorder and metabolic disarray due to lactic aciodosis. We will cont supportive care and await further neuro guidance. Looks a little better today. Move to step down. Adv diet. Further recs per Neuro  I have personally obtained a history, examined the patient, evaluated laboratory and imaging results, formulated the assessment and plan and placed orders.  Rush Farmer, M.D. [facility] Pulmonary/Critical Care Medicine. Pager: [phone]. After hours pager: [phone].  09/18/2013, 9:12 AM

## 2013-09-18 NOTE — Progress Notes (Signed)
Subjective: Per nursing patient does well while sleeping but when awake the movements recur.  Unable to tolerate MRI due to movements.    Objective: Current vital signs: BP 130/72  Pulse 100  Temp(Src) 98.3 F (36.8 C) (Oral)  Resp 23  Ht 5\' 6"  (1.676 m)  Wt 76 kg (167 lb 8.8 oz)  BMI 27.06 kg/m2  SpO2 95% Vital signs in last 24 hours: Temp:  [97.8 F (36.6 C)-99.6 F (37.6 C)] 98.3 F (36.8 C) (08/30 0718) Pulse Rate:  [91-126] 100 (08/30 0700) Resp:  [15-45] 23 (08/30 0700) BP: (113-146)/(55-86) 130/72 mmHg (08/30 0700) SpO2:  [74 %-100 %] 95 % (08/30 0700) Weight:  [76 kg (167 lb 8.8 oz)] 76 kg (167 lb 8.8 oz) (08/30 0452)  Intake/Output from previous day: 08/29 0701 - 08/30 0700 In: 1957.5 [I.V.:1620; IV Piggyback:337.5] Out: 840 [Urine:840] Intake/Output this shift:   Nutritional status: Clear Liquid  Neurologic Exam: Mental Status:  Alert, oriented x 4, thought content appropriate. Speech fluent without evidence of aphasia. Able to follow 3 step commands without difficulty.  Cranial Nerves:  II: Discs couldn't be assessed ; Visual fields can not be formally assess. , pupils equal, round, reactive to light.  III,IV, VI: ptosis not present, extra-ocular motions intact bilaterally  V,VII: smile symmetric, facial light touch sensation no tested  VIII: hearing grossly normal bilaterally  IX,X: gag reflex present  XI: bilateral shoulder shrug was not tested  XII: midline tongue  Motor:  Movements continue but less violent Tone and bulk:normal tone throughout; no atrophy noted  Sensory: no tested  Deep Tendon Reflexes:  2+ throughout Plantars:  Right: downgoing   Left: downgoing     Lab Results: Basic Metabolic Panel:  Recent Labs Lab 09/09/2013 1930 09/17/13 0235 09/17/13 0550 09/17/13 1300 09/18/13 0540  NA 140 141 141 142 144  K 4.8 4.3 4.7 4.7 4.3  CL 102 103 108 108 109  CO2 13* 20 17* 17* 21  GLUCOSE 161* 154* 175* 163* 138*  BUN 34* 34* 33*  33* 26*  CREATININE 2.00* 1.83* 1.75* 1.83* 1.55*  CALCIUM 9.7 8.9 8.0* 8.1* 8.8  MG  --  2.2 2.1  --   --   PHOS  --  4.3 3.9  --   --     Liver Function Tests:  Recent Labs Lab 09/03/2013 1930 09/17/13 0235  AST 144* 121*  ALT 71* 69*  ALKPHOS 168* 164*  BILITOT 0.4 0.4  PROT 7.0 6.7  ALBUMIN 2.4* 2.4*   No results found for this basename: LIPASE, AMYLASE,  in the last 168 hours No results found for this basename: AMMONIA,  in the last 168 hours  CBC:  Recent Labs Lab 09/12/13 0325 09/12/13 1300 08/23/2013 1930 09/17/13 0235 09/17/13 1300 09/18/13 0234  WBC 8.6  --  21.0* 20.7* 13.3* 13.3*  NEUTROABS 5.8  --  14.5*  --  10.9*  --   HGB 8.0* 8.8* 7.7* 8.8* 7.8* 8.2*  HCT 25.0* 28.3* 25.1* 28.4* 25.6* 26.0*  MCV 88.3  --  90.9 88.5 89.5 87.8  PLT 322  --  735* 581* 458* 443*    Cardiac Enzymes:  Recent Labs Lab 08/29/2013 1930 09/17/13 0235 09/17/13 1300  CKTOTAL 9082*  --  4537*  TROPONINI <0.30 <0.30  --     Lipid Panel: No results found for this basename: CHOL, TRIG, HDL, CHOLHDL, VLDL, LDLCALC,  in the last 168 hours  CBG:  Recent Labs Lab 09/17/13 1752 09/17/13 2000 09/18/13  0030 09/18/13 0336 09/18/13 0710  GLUCAP 139* 132* 137* 92* 109*    Microbiology: Results for orders placed during the hospital encounter of 09/15/2013  MRSA PCR SCREENING     Status: None   Collection Time    09/17/13  1:35 AM      Result Value Ref Range Status   MRSA by PCR NEGATIVE  NEGATIVE Final   Comment:            The GeneXpert MRSA Assay (FDA     approved for NASAL specimens     only), is one component of a     comprehensive MRSA colonization     surveillance program. It is not     intended to diagnose MRSA     infection nor to guide or     monitor treatment for     MRSA infections.    Coagulation Studies: No results found for this basename: LABPROT, INR,  in the last 72 hours  Imaging: Dg Chest Port 1 View  08/20/2013   CLINICAL DATA:  63 year old  female with fever. Initial encounter.  EXAM: PORTABLE CHEST - 1 VIEW  COMPARISON:  09/14/2013 and earlier.  FINDINGS: Portable AP view at 2048 hrs. Stable lung volumes with mild to moderate elevation of the right hemidiaphragm. Stable cardiomegaly and mediastinal contours. Chronic retrocardiac streaky opacity most resembles atelectasis and is stable. No areas of worsening ventilation. No pneumothorax, pulmonary edema, or pleural effusion identified. Visualized tracheal air column is within normal limits.  IMPRESSION: Chronic retrocardiac atelectasis. No acute cardiopulmonary abnormality.   Electronically Signed   By: Lars Pinks M.D.   On: 08/26/2013 21:07    Medications:  I have reviewed the patient's current medications. Scheduled: . amantadine  100 mg Oral BID  . antiseptic oral rinse  7 mL Mouth Rinse BID  . aspirin EC  81 mg Oral Daily  . clonazePAM  2 mg Oral TID  . heparin  5,000 Units Subcutaneous 3 times per day  . insulin aspart  0-9 Units Subcutaneous 6 times per day  . insulin glargine  5 Units Subcutaneous QHS  . tetrahydrozoline  2 drop Both Eyes BID    Assessment/Plan: Movements improving.  Amantadine started and benzos increased.  Patient still not able to tolerate MRI.    Recommendations: 1.  MRI to be re-attempted with conscious sedation 2.  Genetic testing.  To be arranged in AM.   LOS: 2 days   Alexis Goodell, MD [facility] [phone] 09/18/2013  10:19 AM

## 2013-09-19 ENCOUNTER — Encounter (HOSPITAL_COMMUNITY): Payer: BC Managed Care – PPO | Admitting: Anesthesiology

## 2013-09-19 ENCOUNTER — Inpatient Hospital Stay (HOSPITAL_COMMUNITY): Payer: BC Managed Care – PPO

## 2013-09-19 ENCOUNTER — Ambulatory Visit: Payer: BC Managed Care – PPO | Admitting: Neurology

## 2013-09-19 ENCOUNTER — Encounter (HOSPITAL_COMMUNITY): Payer: Self-pay | Admitting: Certified Registered Nurse Anesthetist

## 2013-09-19 ENCOUNTER — Encounter (HOSPITAL_COMMUNITY): Admission: EM | Disposition: E | Payer: Self-pay | Source: Home / Self Care | Attending: Pulmonary Disease

## 2013-09-19 ENCOUNTER — Inpatient Hospital Stay (HOSPITAL_COMMUNITY): Payer: BC Managed Care – PPO | Admitting: Anesthesiology

## 2013-09-19 DIAGNOSIS — G255 Other chorea: Secondary | ICD-10-CM | POA: Diagnosis present

## 2013-09-19 DIAGNOSIS — R451 Restlessness and agitation: Secondary | ICD-10-CM

## 2013-09-19 DIAGNOSIS — I059 Rheumatic mitral valve disease, unspecified: Secondary | ICD-10-CM

## 2013-09-19 DIAGNOSIS — J9601 Acute respiratory failure with hypoxia: Secondary | ICD-10-CM

## 2013-09-19 DIAGNOSIS — F430 Acute stress reaction: Secondary | ICD-10-CM

## 2013-09-19 HISTORY — PX: RADIOLOGY WITH ANESTHESIA: SHX6223

## 2013-09-19 LAB — BLOOD GAS, ARTERIAL
Acid-base deficit: 7.9 mmol/L — ABNORMAL HIGH (ref 0.0–2.0)
BICARBONATE: 16.9 meq/L — AB (ref 20.0–24.0)
Drawn by: 331761
FIO2: 1 %
MECHVT: 480 mL
O2 Saturation: 99.5 %
PCO2 ART: 32 mmHg — AB (ref 35.0–45.0)
PEEP: 5 cmH2O
PO2 ART: 287 mmHg — AB (ref 80.0–100.0)
Patient temperature: 97.8
RATE: 16 resp/min
TCO2: 17.9 mmol/L (ref 0–100)
pH, Arterial: 7.339 — ABNORMAL LOW (ref 7.350–7.450)

## 2013-09-19 LAB — COMPREHENSIVE METABOLIC PANEL
ALT: 66 U/L — ABNORMAL HIGH (ref 0–35)
AST: 45 U/L — ABNORMAL HIGH (ref 0–37)
Albumin: 2.2 g/dL — ABNORMAL LOW (ref 3.5–5.2)
Alkaline Phosphatase: 271 U/L — ABNORMAL HIGH (ref 39–117)
Anion gap: 16 — ABNORMAL HIGH (ref 5–15)
BUN: 22 mg/dL (ref 6–23)
CO2: 18 mEq/L — ABNORMAL LOW (ref 19–32)
Calcium: 8.6 mg/dL (ref 8.4–10.5)
Chloride: 110 mEq/L (ref 96–112)
Creatinine, Ser: 1.35 mg/dL — ABNORMAL HIGH (ref 0.50–1.10)
GFR calc Af Amer: 47 mL/min — ABNORMAL LOW (ref >90)
GFR calc non Af Amer: 41 mL/min — ABNORMAL LOW (ref >90)
Glucose, Bld: 135 mg/dL — ABNORMAL HIGH (ref 70–99)
Potassium: 4.2 mEq/L (ref 3.7–5.3)
SODIUM: 144 meq/L (ref 137–147)
TOTAL PROTEIN: 6 g/dL (ref 6.0–8.3)
Total Bilirubin: 0.3 mg/dL (ref 0.3–1.2)

## 2013-09-19 LAB — CK TOTAL AND CKMB (NOT AT ARMC)
CK TOTAL: 945 U/L — AB (ref 7–177)
CK, MB: 4.1 ng/mL — ABNORMAL HIGH (ref 0.3–4.0)
Relative Index: 0.4 (ref 0.0–2.5)

## 2013-09-19 LAB — CBC
HCT: 28.9 % — ABNORMAL LOW (ref 36.0–46.0)
HEMOGLOBIN: 8.9 g/dL — AB (ref 12.0–15.0)
MCH: 27.5 pg (ref 26.0–34.0)
MCHC: 30.8 g/dL (ref 30.0–36.0)
MCV: 89.2 fL (ref 78.0–100.0)
Platelets: 574 10*3/uL — ABNORMAL HIGH (ref 150–400)
RBC: 3.24 MIL/uL — ABNORMAL LOW (ref 3.87–5.11)
RDW: 14.8 % (ref 11.5–15.5)
WBC: 11.2 10*3/uL — AB (ref 4.0–10.5)

## 2013-09-19 LAB — TRIGLYCERIDES: TRIGLYCERIDES: 245 mg/dL — AB (ref <150)

## 2013-09-19 LAB — GLUCOSE, CAPILLARY
GLUCOSE-CAPILLARY: 127 mg/dL — AB (ref 70–99)
GLUCOSE-CAPILLARY: 143 mg/dL — AB (ref 70–99)
GLUCOSE-CAPILLARY: 151 mg/dL — AB (ref 70–99)
GLUCOSE-CAPILLARY: 93 mg/dL (ref 70–99)
Glucose-Capillary: 151 mg/dL — ABNORMAL HIGH (ref 70–99)
Glucose-Capillary: 140 mg/dL — ABNORMAL HIGH (ref 70–99)

## 2013-09-19 LAB — CARDIOLIPIN ANTIBODIES, IGG, IGM, IGA
Anticardiolipin IgA: 11 APL U/mL — ABNORMAL LOW (ref <22)
Anticardiolipin IgG: 2 GPL U/mL — ABNORMAL LOW (ref <23)
Anticardiolipin IgM: 4 MPL U/mL — ABNORMAL LOW (ref <11)

## 2013-09-19 LAB — ANGIOTENSIN CONVERTING ENZYME: Angiotensin-Converting Enzyme: 43 U/L (ref 8–52)

## 2013-09-19 LAB — ANA: Anti Nuclear Antibody(ANA): NEGATIVE

## 2013-09-19 SURGERY — RADIOLOGY WITH ANESTHESIA
Anesthesia: Monitor Anesthesia Care

## 2013-09-19 MED ORDER — FENTANYL CITRATE 0.05 MG/ML IJ SOLN
100.0000 ug | INTRAMUSCULAR | Status: DC | PRN
  Administered 2013-09-19 – 2013-09-22 (×9): 100 ug via INTRAVENOUS
  Filled 2013-09-19 (×9): qty 2

## 2013-09-19 MED ORDER — HALOPERIDOL LACTATE 5 MG/ML IJ SOLN
5.0000 mg | Freq: Once | INTRAMUSCULAR | Status: AC
  Administered 2013-09-19: 5 mg via INTRAVENOUS

## 2013-09-19 MED ORDER — LORAZEPAM 2 MG/ML IJ SOLN
2.0000 mg | Freq: Once | INTRAMUSCULAR | Status: AC
  Administered 2013-09-19: 2 mg via INTRAVENOUS

## 2013-09-19 MED ORDER — MIDAZOLAM HCL 2 MG/2ML IJ SOLN: 2.0000 mg | INTRAMUSCULAR | Status: DC | PRN

## 2013-09-19 MED ORDER — CETYLPYRIDINIUM CHLORIDE 0.05 % MT LIQD
7.0000 mL | Freq: Four times a day (QID) | OROMUCOSAL | Status: DC
  Administered 2013-09-19 – 2013-09-23 (×17): 7 mL via OROMUCOSAL

## 2013-09-19 MED ORDER — HYDRALAZINE HCL 20 MG/ML IJ SOLN
INTRAMUSCULAR | Status: AC
  Administered 2013-09-19: 20 mg via INTRAVENOUS
  Filled 2013-09-19: qty 1

## 2013-09-19 MED ORDER — PANTOPRAZOLE SODIUM 40 MG IV SOLR
40.0000 mg | Freq: Every day | INTRAVENOUS | Status: DC
  Administered 2013-09-19 – 2013-09-22 (×4): 40 mg via INTRAVENOUS
  Filled 2013-09-19 (×6): qty 40

## 2013-09-19 MED ORDER — ROCURONIUM BROMIDE 50 MG/5ML IV SOLN
80.0000 mg | Freq: Once | INTRAVENOUS | Status: AC
  Administered 2013-09-19: 80 mg via INTRAVENOUS
  Filled 2013-09-19: qty 8

## 2013-09-19 MED ORDER — MIDAZOLAM HCL 2 MG/2ML IJ SOLN
2.0000 mg | INTRAMUSCULAR | Status: DC | PRN
  Administered 2013-09-21 – 2013-09-22 (×6): 2 mg via INTRAVENOUS
  Filled 2013-09-19 (×7): qty 2

## 2013-09-19 MED ORDER — FENTANYL CITRATE 0.05 MG/ML IJ SOLN
200.0000 ug | Freq: Once | INTRAMUSCULAR | Status: AC
  Administered 2013-09-19: 200 ug via INTRAVENOUS

## 2013-09-19 MED ORDER — MIDAZOLAM HCL 2 MG/2ML IJ SOLN
INTRAMUSCULAR | Status: AC
  Filled 2013-09-19: qty 2

## 2013-09-19 MED ORDER — PROPOFOL 10 MG/ML IV EMUL
0.0000 ug/kg/min | INTRAVENOUS | Status: DC
  Administered 2013-09-19: 20 ug/kg/min via INTRAVENOUS
  Administered 2013-09-20: 5 ug/kg/min via INTRAVENOUS
  Administered 2013-09-20 (×2): 50 ug/kg/min via INTRAVENOUS
  Filled 2013-09-19 (×5): qty 100

## 2013-09-19 MED ORDER — FENTANYL CITRATE 0.05 MG/ML IJ SOLN
INTRAMUSCULAR | Status: AC
  Filled 2013-09-19: qty 2

## 2013-09-19 MED ORDER — LORAZEPAM 2 MG/ML IJ SOLN
INTRAMUSCULAR | Status: AC
  Filled 2013-09-19: qty 1

## 2013-09-19 MED ORDER — LORAZEPAM 2 MG/ML IJ SOLN
1.0000 mg | Freq: Three times a day (TID) | INTRAMUSCULAR | Status: DC
Start: 2013-09-19 — End: 2013-09-19
  Administered 2013-09-19: 1 mg via INTRAVENOUS

## 2013-09-19 MED ORDER — ETOMIDATE 2 MG/ML IV SOLN
INTRAVENOUS | Status: AC
  Filled 2013-09-19: qty 10

## 2013-09-19 MED ORDER — ETOMIDATE 2 MG/ML IV SOLN
30.0000 mg | Freq: Once | INTRAVENOUS | Status: AC
  Administered 2013-09-19: 30 mg via INTRAVENOUS
  Filled 2013-09-19: qty 15

## 2013-09-19 MED ORDER — CHLORHEXIDINE GLUCONATE 0.12 % MT SOLN
15.0000 mL | Freq: Two times a day (BID) | OROMUCOSAL | Status: DC
  Administered 2013-09-19 – 2013-09-23 (×8): 15 mL via OROMUCOSAL
  Filled 2013-09-19 (×6): qty 15

## 2013-09-19 MED ORDER — HALOPERIDOL LACTATE 5 MG/ML IJ SOLN
INTRAMUSCULAR | Status: AC
  Filled 2013-09-19: qty 1

## 2013-09-19 MED ORDER — ALBUTEROL SULFATE (2.5 MG/3ML) 0.083% IN NEBU: 2.5000 mg | INHALATION_SOLUTION | RESPIRATORY_TRACT | Status: DC | PRN

## 2013-09-19 MED ORDER — HYDRALAZINE HCL 20 MG/ML IJ SOLN
10.0000 mg | INTRAMUSCULAR | Status: DC | PRN
  Administered 2013-09-19: 20 mg via INTRAVENOUS
  Administered 2013-09-20 – 2013-09-21 (×2): 40 mg via INTRAVENOUS
  Administered 2013-09-29: 20 mg via INTRAVENOUS
  Filled 2013-09-19 (×3): qty 2
  Filled 2013-09-19: qty 1

## 2013-09-19 MED ORDER — LORAZEPAM 2 MG/ML IJ SOLN: 1.0000 mg | Freq: Three times a day (TID) | INTRAMUSCULAR | Status: DC | PRN

## 2013-09-19 MED ORDER — SODIUM CHLORIDE 0.9 % IV SOLN
INTRAVENOUS | Status: DC
  Administered 2013-09-19: 16:00:00 via INTRAVENOUS

## 2013-09-19 MED ORDER — PROPOFOL 10 MG/ML IV EMUL
5.0000 ug/kg/min | INTRAVENOUS | Status: DC
  Administered 2013-09-19: 80 ug/kg/min via INTRAVENOUS

## 2013-09-19 MED ORDER — MIDAZOLAM HCL 2 MG/2ML IJ SOLN
4.0000 mg | Freq: Once | INTRAMUSCULAR | Status: AC
  Administered 2013-09-19: 4 mg via INTRAVENOUS

## 2013-09-19 NOTE — Clinical Documentation Improvement (Addendum)
  Patient admitted with AKI, Rhabdo, neuropathy presumed to be 2/2 DM2, lactic acidosis and involuntary abnormal movements.  "CKD" documented in current record.  Known history of HTN.  GFR back to 07/2012 to current has ranged from the 30's to 40's per [facility] review (black female)  Please document the stage of the patient's CKD:   - CKD Stage I - GFR > OR = 90  - CKD Stage II - GFR 60-89  - CKD Stage III - GFR 30-59  - CKD Stage IV - GFR 15-29  - CKD Stage V - GFR < 15  - ESRD (End Stage Renal Disease)  - Other Condition  - Unable to Clinically determine    Thank You, Erling Conte ,RN Clinical Documentation Specialist:  [phone] [facility] [employer] [facility]

## 2013-09-19 NOTE — Transfer of Care (Signed)
Immediate Anesthesia Transfer of Care Note  Patient: [name]  Procedure(s) Performed: Procedure(s): MRI OF BRAIN (N/A)  Patient Location: ICU  Anesthesia Type:General  Level of Consciousness: Patient remains intubated per anesthesia plan  Airway & Oxygen Therapy: Patient remains intubated per anesthesia plan and Patient placed on Ventilator (see vital sign flow sheet for setting)  Post-op Assessment: Post -op Vital signs reviewed and stable  Post vital signs: Reviewed and stable  Complications: No apparent anesthesia complications

## 2013-09-19 NOTE — Progress Notes (Signed)
[facility] Progress Note Patient Name: [name] DOB: 16-Dec-1950 MRN: [id number]   Date of Service  09/18/2013  HPI/Events of Note  Acute agitation, responded to haldol in past  eICU Interventions  Give iv haldol 5mg      Intervention Category Major Interventions: Change in mental status - evaluation and management;Delirium, psychosis, severe agitation - evaluation and management  Asencion Noble 09/17/2013, 7:11 PM

## 2013-09-19 NOTE — Procedures (Signed)
Extubation Procedure Note  Patient Details:   Name: [name] DOB: 1950-11-04 MRN: [id number]   Airway Documentation:     Evaluation  O2 sats: stable throughout Complications: No apparent complications Patient did tolerate procedure well. Bilateral Breath Sounds: Clear;Diminished Suctioning: Airway Yes Patient extubated to [place]. RN at bedside. No complications. Vital signs stable at this time. RT will continue to monitor.  Mcneil Sober 09/02/2013, 4:19 PM

## 2013-09-19 NOTE — Progress Notes (Signed)
Subjective:  Patient continues to have movements while in room.    Afebrile BP 145/88  ANA (pending) anticardiolipin antibodies (pending) TSH .0689 T3 2.1 T4  1.06 Ceruloplasmin  pending ACE level  pending HIV (-)     Objective: Current vital signs: BP 145/88  Pulse 111  Temp(Src) 98 F (36.7 C) (Oral)  Resp 23  Ht 5\' 6"  (1.676 m)  Wt 83.2 kg (183 lb 6.8 oz)  BMI 29.62 kg/m2  SpO2 100% Vital signs in last 24 hours: Temp:  [97.5 F (36.4 C)-98.5 F (36.9 C)] 98 F (36.7 C) (08/31 0850) Pulse Rate:  [53-120] 111 (08/31 0900) Resp:  [17-39] 23 (08/31 0900) BP: (85-166)/(65-121) 145/88 mmHg (08/31 0900) SpO2:  [90 %-100 %] 100 % (08/31 0900) Weight:  [83.2 kg (183 lb 6.8 oz)] 83.2 kg (183 lb 6.8 oz) (08/31 0500)  Intake/Output from previous day: 08/30 0701 - 08/31 0700 In: 1900 [P.O.:100; I.V.:1800] Out: 1175 [Urine:1175] Intake/Output this shift: Total I/O In: 150 [I.V.:150] Out: -  Nutritional status: Clear Liquid  Neurologic Exam: Mental Status:  Alert, oriented x 4, thought content appropriate. Speech fluent without evidence of aphasia. Able to follow 3 step commands without difficulty.  Cranial Nerves:  II:  Visual fields can not be formally assess. , pupils equal, round, reactive to light.  III,IV, VI: ptosis not present, extra-ocular motions intact bilaterally  V,VII: smile symmetric, facial light touch sensation no tested  VIII: hearing grossly normal bilaterally  IX,X: gag reflex present  XI: bilateral shoulder shrug was not tested  XII: midline tongue  Motor:  Movements continue  --after given haldol and Ativan symptoms decreased but still not fully resolved . Tone and bulk:normal tone throughout; no atrophy noted  Sensory: UE and LE intact Deep Tendon Reflexes:  2+ throughout  Plantars:  Right: downgoing   Left: downgoing      Lab Results: Basic Metabolic Panel:  Recent Labs Lab 08/31/2013 1930 09/17/13 0235 09/17/13 0550  09/17/13 1300 09/18/13 0540 09/06/2013 0232  NA 140 141 141 142 144 144  K 4.8 4.3 4.7 4.7 4.3 4.2  CL 102 103 108 108 109 110  CO2 13* 20 17* 17* 21 18*  GLUCOSE 161* 154* 175* 163* 138* 135*  BUN 34* 34* 33* 33* 26* 22  CREATININE 2.00* 1.83* 1.75* 1.83* 1.55* 1.35*  CALCIUM 9.7 8.9 8.0* 8.1* 8.8 8.6  MG  --  2.2 2.1  --   --   --   PHOS  --  4.3 3.9  --   --   --     Liver Function Tests:  Recent Labs Lab 09/02/2013 1930 09/17/13 0235 09/15/2013 0232  AST 144* 121* 45*  ALT 71* 69* 66*  ALKPHOS 168* 164* 271*  BILITOT 0.4 0.4 0.3  PROT 7.0 6.7 6.0  ALBUMIN 2.4* 2.4* 2.2*   No results found for this basename: LIPASE, AMYLASE,  in the last 168 hours No results found for this basename: AMMONIA,  in the last 168 hours  CBC:  Recent Labs Lab 09/08/2013 1930 09/17/13 0235 09/17/13 1300 09/18/13 0234 09/08/2013 0232  WBC 21.0* 20.7* 13.3* 13.3* 11.2*  NEUTROABS 14.5*  --  10.9*  --   --   HGB 7.7* 8.8* 7.8* 8.2* 8.9*  HCT 25.1* 28.4* 25.6* 26.0* 28.9*  MCV 90.9 88.5 89.5 87.8 89.2  PLT 735* 581* 458* 443* 574*    Cardiac Enzymes:  Recent Labs Lab 09/04/2013 1930 09/17/13 0235 09/17/13 1300  CKTOTAL 9082*  --  [place] <0.30 <0.30  --     Lipid Panel: No results found for this basename: CHOL, TRIG, HDL, CHOLHDL, VLDL, LDLCALC,  in the last 168 hours  CBG:  Recent Labs Lab 09/18/13 1535 09/18/13 1914 09/18/13 2337 09/14/2013 0342 08/20/2013 0749  GLUCAP 120* 147* 93 127* 140*    Microbiology: Results for orders placed during the hospital encounter of 08/20/2013  CULTURE, BLOOD (ROUTINE X 2)     Status: None   Collection Time    08/24/2013  8:25 PM      Result Value Ref Range Status   Specimen Description BLOOD RIGHT FOREARM   Final   Special Requests BOTTLES DRAWN AEROBIC AND ANAEROBIC 5CC   Final   Culture  Setup Time     Final   Value: 09/17/2013 01:00     Performed at [facility]   Culture     Final   Value:        BLOOD CULTURE  RECEIVED NO GROWTH TO DATE CULTURE WILL BE HELD FOR 5 DAYS BEFORE ISSUING A FINAL NEGATIVE REPORT     Performed at [facility]   Report Status PENDING   Incomplete  CULTURE, BLOOD (ROUTINE X 2)     Status: None   Collection Time    09/04/2013  8:32 PM      Result Value Ref Range Status   Specimen Description BLOOD HAND RIGHT   Final   Special Requests BOTTLES DRAWN AEROBIC AND ANAEROBIC 5CC   Final   Culture  Setup Time     Final   Value: 09/17/2013 01:00     Performed at [facility]   Culture     Final   Value:        BLOOD CULTURE RECEIVED NO GROWTH TO DATE CULTURE WILL BE HELD FOR 5 DAYS BEFORE ISSUING A FINAL NEGATIVE REPORT     Performed at [facility]   Report Status PENDING   Incomplete  URINE CULTURE     Status: None   Collection Time    09/11/2013  8:46 PM      Result Value Ref Range Status   Specimen Description URINE, CATHETERIZED   Final   Special Requests CX ADDED AT 0307 ON [id number]   Final   Culture  Setup Time     Final   Value: 09/17/2013 15:23     Performed at [facility]     Final   Value: NO GROWTH     Performed at [facility]   Culture     Final   Value: NO GROWTH     Performed at [facility]   Report Status 09/18/2013 FINAL   Final  MRSA PCR SCREENING     Status: None   Collection Time    09/17/13  1:35 AM      Result Value Ref Range Status   MRSA by PCR NEGATIVE  NEGATIVE Final   Comment:            The GeneXpert MRSA Assay (FDA     approved for NASAL specimens     only), is one component of a     comprehensive MRSA colonization     surveillance program. It is not     intended to diagnose MRSA     infection nor to guide or     monitor treatment for     MRSA infections.    Coagulation Studies:  No results found for this basename: LABPROT, INR,  in the last 72 hours  Imaging: No results found.  Medications:  Scheduled: . amantadine  100 mg Oral BID  . antiseptic oral rinse  7  mL Mouth Rinse BID  . aspirin EC  81 mg Oral Daily  . clonazePAM  2 mg Oral TID  . heparin  5,000 Units Subcutaneous 3 times per day  . insulin aspart  0-9 Units Subcutaneous 6 times per day  . insulin glargine  5 Units Subcutaneous QHS  . tetrahydrozoline  2 drop Both Eyes BID   Continuous:  RZN:BVAPOL chloride, LORazepam, sodium chloride  Assessment/Plan:  patient continues with movements. Continues on Amantadine and Ativan PRN.  This AM patient received Ativan (1mg ) and Haldol (5 mg) with mild improvements.  PCCM has ordered repeat CK. MRI under anesthesia has been posted for 11 AM this morning.   Recommend: 1) MRI under anesthesia (11AM today) 2) Genetic testing    Etta Quill PA-C [facility] [phone]  08/27/2013, 9:36 AM

## 2013-09-19 NOTE — Anesthesia Postprocedure Evaluation (Signed)
  Anesthesia Post-op Note  Patient: [name]  Procedure(s) Performed: Procedure(s): MRI OF BRAIN (N/A)  Patient Location: ICU  Anesthesia Type:General  Level of Consciousness: sedated and unresponsive  Airway and Oxygen Therapy: Patient remains intubated and on ventilator  Post-op Pain: none  Post-op Assessment: Post-op Vital signs reviewed, Patient's Cardiovascular Status Stable and Respiratory Function Stable  Post-op Vital Signs: Reviewed  Filed Vitals:   09/14/2013 1000  BP: 147/84  Pulse: 116  Temp:   Resp: 22    Complications: No apparent anesthesia complications

## 2013-09-19 NOTE — Progress Notes (Signed)
PULMONARY / CRITICAL CARE MEDICINE   Name: [name] MRN: [id number] DOB: January 15, 1951    ADMISSION DATE:  09/15/2013 CONSULTATION DATE:  8/28  REFERRING MD :  Thereasa Solo   CHIEF COMPLAINT:  Concern for airway protection   INITIAL PRESENTATION: 63 yo with three week history of choreiform movements presents 8/28 with worsening involuntary movements, metabolic acidosis, rhabdomyolysis  and AKI.   STUDIES / EVENTS:  8/28  Admitted 8/31  MRI brain >>>  bilateral basal ganglial altered signal intensity  8/13  Intubated in MRI secondary to oversedation  INTERVAL HISTORY: Intubated in MRI by anesthesia due to oversedation.  VITAL SIGNS: Temp:  [97.5 F (36.4 C)-98.5 F (36.9 C)] 98 F (36.7 C) (08/31 0850) Pulse Rate:  [53-120] 87 (08/31 1407) Resp:  [22-39] 22 (08/31 1407) BP: (85-166)/(65-121) 133/80 mmHg (08/31 1407) SpO2:  [90 %-100 %] 100 % (08/31 1407) FiO2 (%):  [40 %] 40 % (08/31 1408) Weight:  [83.2 kg (183 lb 6.8 oz)] 83.2 kg (183 lb 6.8 oz) (08/31 0500)  HEMODYNAMICS:    VENTILATOR SETTINGS: Vent Mode:  [-] PRVC FiO2 (%):  [40 %] 40 % Set Rate:  [16 bmp] 16 bmp Vt Set:  [500 mL] 500 mL PEEP:  [5 cmH20] 5 cmH20 Plateau Pressure:  [17 cmH20] 17 cmH20  INTAKE / OUTPUT: Intake/Output     08/30 0701 - 08/31 0700 08/31 0701 - 09/01 0700   P.O. 100    I.V. (mL/kg) 1800 (21.6) 225 (2.7)   IV Piggyback     Total Intake(mL/kg) 1900 (22.8) 225 (2.7)   Urine (mL/kg/hr) 1175 (0.6)    Total Output 1175     Net +725 +225        Stool Occurrence 1 x      PHYSICAL EXAMINATION: General: Obese, middle aged female. Sedated on vent.  Neuro: Sedated on vent. RASS -1 HEENT: [place]/AT, No JVD noted Cardiovascular: RRR, No M/R/G  Lungs: Diffuse rhonchi Abdomen:  Soft, Non-distedned  Musculoskeletal: Intact  Skin: Intact  LABS:  CBC  Recent Labs Lab 09/17/13 1300 09/18/13 0234 08/31/2013 0232  WBC 13.3* 13.3* 11.2*  HGB 7.8* 8.2* 8.9*  HCT 25.6* 26.0* 28.9*  PLT  458* 443* 574*   Coag's  Recent Labs Lab 09/17/13 0550  APTT 24  INR 1.14   BMET  Recent Labs Lab 09/17/13 1300 09/18/13 0540 09/08/2013 0232  NA 142 144 144  K 4.7 4.3 4.2  CL 108 109 110  CO2 17* 21 18*  BUN 33* 26* 22  CREATININE 1.83* 1.55* 1.35*  GLUCOSE 163* 138* 135*   Electrolytes  Recent Labs Lab 09/17/13 0235 09/17/13 0550 09/17/13 1300 09/18/13 0540 09/13/2013 0232  CALCIUM 8.9 8.0* 8.1* 8.8 8.6  MG 2.2 2.1  --   --   --   PHOS 4.3 3.9  --   --   --    Sepsis Markers  Recent Labs Lab 09/17/13 0235 09/17/13 1300 09/18/13 0540  LATICACIDVEN 2.2 1.8 1.7   ABG  Recent Labs Lab 08/24/2013 2328  PHART 7.370  PCO2ART 35.6  PO2ART 78.0*   Liver Enzymes  Recent Labs Lab 08/24/2013 1930 09/17/13 0235 09/15/2013 0232  AST 144* 121* 45*  ALT 71* 69* 66*  ALKPHOS 168* 164* 271*  BILITOT 0.4 0.4 0.3  ALBUMIN 2.4* 2.4* 2.2*   Cardiac Enzymes  Recent Labs Lab 08/20/2013 1930 09/04/2013 2025 09/17/13 0235  TROPONINI <0.30  --  <0.30  PROBNP  --  4061.0*  --  Glucose  Recent Labs Lab 09/18/13 1127 09/18/13 1535 09/18/13 1914 09/18/13 2337 09/07/2013 0342 09/01/2013 0749  GLUCAP 128* 120* 147* 93 127* 140*   Imaging No results found.  ASSESSMENT / PLAN:  PULMONARY A:  Acute respiratory failure, intubated for procedural sedation 8/31 P: SBT with intent to extubate Goal SpO2>92 Supplemental oxygen  CARDIOVASCULAR A:  Chronic systolic congestive heart failure P:  MAP goal > 76mmHg TTE  RENAL A:  AKI, improving Rhabdomyolysis   Anion gap acidosis  P:   Trend BMP NS@100   GASTROINTESTINAL A:   Abnormal LFTs, improving Nutrition GIPx P:   NPO Protonix  HEMATOLOGIC A:   Anemia VTE Px P:  Trend CBC Heparin [place]  INFECTIOUS A:   No overt infection  P:   Follow blood / urine cx  ENDOCRINE A:   Diabetes  P:   SSI  NEUROLOGIC A:   Newly diagnosed movement disorder Acute encephalopathy  P:   RASS goal  0 Work up per Neurology Hold Propofol as expecting to extubate  Georgann Housekeeper, ACNP [place] Pulmonology/Critical Care Pager [phone] or [phone]  I have personally obtained history, examined patient, evaluated and interpreted laboratory and imaging results, reviewed medical records, formulated assessment / plan and placed orders.  CRITICAL CARE:  The patient is critically ill with multiple organ systems failure and requires high complexity decision making for assessment and support, frequent evaluation and titration of therapies, application of advanced monitoring technologies and extensive interpretation of multiple databases. Critical Care Time devoted to patient care services described in this note is 35 minutes.   Doree Fudge, MD Pulmonary and [place] Pager: [phone]  08/21/2013, 3:51 PM

## 2013-09-19 NOTE — Progress Notes (Signed)
[name] - Stepdown / ICU Progress Note  [name] MPN:[id number] DOB: 03-20-1950 DOA: 08/27/2013 PCP: Angelica Chessman, MD   Brief narrative: 63 year old woman with underlying history of diabetes mellitus and recent new diagnosis of systolic heart failure. She initially presented for admission on 7-30 due to complaints of weakness of the lower extremities and right facial numbness and slurred speech. She was evaluated by neurology during that admission as well as by psychiatry. She was discharged on 8/Hernandez with the diagnosis of lower extremity weakness due to severe diabetic peripheral neuropathy. During that admission she was also found to have new systolic heart failure and was started on Lasix. She was also started on Neurontin, Lexapro and Klonopin.  She returned to the emergency department on 09/06/2013 with complaints of shortness of breath and abnormal movements of the extremities primarily her arms, legs and face. She was once again evaluated by neurology who felt her movements are related to tardive dyskinesia and patient was resumed on her usual Klonopin with plans to have outpatient followup with a movement disorder specialist in neurology. Psychiatry was also consulted to assist in medication management given her symptomatology and likely tardive dyskinesia. Her amitriptyline was discontinued, she was started on Neurontin for neuropathic pain, her Klonopin was changed to Hernandez mg by mouth twice a day, and her Lexapro was increased to 10 mg daily. Patient was also referred to outpatient psychiatric services prior to discharge noting improvement in her previous depression and anxiety and are decreased and previous insomnia. She was eventually discharged on 8/26.  She returned to the emergency department on 8/28 for worsening of movement abnormality. According to the patient's family the unusual movements had actually been occurring for at least 2 months and have been progressive as  outlined above. Family felt the patient may not been taking her medications at home but the patient reported to them that when she took the medications and made her feel very hot. Upon examination by the emergency room physician the patient was unable to control violent movements of all 4 extremities and her head. She was awake and alert and oriented in answering questions appropriately. She was admitted by the critical care medicine team. She was found to have metabolic acidosis in the setting of rhabdomyolysis. She was also noted to have mild transaminitis and progressive worsening of her anemia likely due to recent IV fluids. There was concern of a possible SIRS but no definitive source of infection was found. Neurology was once again consulted because of hypokinetic choreaform/ballistic movements. Workup has been complicated by the fact that patient's movements are significant enough she cannot undergo MRI or other testing. Differential included Huntington's chorea, toxic metabolic etiology, vascular versus possible autoimmune etiology. Workup was still in progress at time of patient beng deemed appropriate to transfer out of the step down unit.  Subsequently after our exam this morning patient was given general anesthesia to undergo MRI scanning and now remains on the ventilator and back in the ICU setting. The MRI did reveal new changes since her previous MRI consisting of bilateral basal ganglia altered signal intensity that may reflect result of hepatic encephalopathy, prior severe hypoglycemia problem hypoxic ischemic encephalopathy, endocrine dysfunction, copper overload and less likely carbon monoxide poisoning.  HPI/Subjective: Patient alert and oriented, states unable to eat effectively due to movement disorder, denies pain or shortness of breath.  Assessment/Plan:    Involuntary movements/Chorea -Etiology unclear-neurology following and workup in process-was started on Symmetrel on 8/29  Rhabdomyolysis -Secondary to persistent involuntary movement-CPK peaked at 9082 and as of today has trended downward to 145-continue IV fluids-was on statin prior to admission    Acute respiratory failure/post procedure -Now mechanically ventilated after sedation for MRI-PCCM to assume care    Type 2 diabetes mellitus without complication -CBGs well controlled-continue sliding scale insulin and Lantus insulin    CKD (chronic kidney disease), stage III -Creatinine stable in setting of acute rhabdomyolysis-BUN steadily trending downward with hydration    Sepsis?? 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 rate 22, height 5\' 6"  (Hernandez.676 m), weight 183 lb 6.8 oz (83.2 kg), SpO2 100.00%.  Intake/Output Summary (Last 24 hours) at 09/12/2013 1450 Last data filed at 09/14/2013 1000  Gross per 24 hour  Intake   1500 ml  Output   1050 ml  Net    450 ml     Exam: Gen: No acute respiratory distress Neuro: Marked choreiform-type involuntary movements of entire body including trunk, patient oriented in and able to talk Chest: Clear to auscultation  bilaterally without wheezes, rhonchi or crackles, 4 L-now intubated on 40% FiO2 Cardiac: Regular rate and rhythm, S1-S2, no rubs murmurs or gallops, no peripheral edema, no JVD Abdomen: Soft nontender nondistended without obvious hepatosplenomegaly, no ascites Extremities: Symmetrical in appearance without cyanosis, clubbing or effusion  Scheduled Meds:  Scheduled Meds: . amantadine  100 mg Oral BID  . antiseptic oral rinse  7 mL Mouth Rinse QID  . aspirin EC  81 mg Oral Daily  . chlorhexidine  15 mL Mouth Rinse BID  . clonazePAM  2 mg Oral TID  . heparin  5,000 Units Subcutaneous 3 times per day  . insulin aspart  0-9 Units Subcutaneous 6 times per day  . insulin glargine  5 Units Subcutaneous QHS  . pantoprazole (PROTONIX) IV  40 mg Intravenous Daily  . tetrahydrozoline  2 drop Both Eyes BID   Data Reviewed: Basic Metabolic Panel:  Recent Labs Lab 09/02/2013 1930 09/17/13 0235 09/17/13 0550 09/17/13 1300 09/18/13 0540 09/08/2013 0232  NA 140 141 141 142 144 144  K 4.8 4.3 4.7 4.7 4.3 4.2  CL 102 103 108 108 109 110  CO2 13* 20 17* 17* 21 18*  GLUCOSE 161* 154* 175* 163* 138* 135*  BUN 34* 34* 33* 33* 26* 22  CREATININE 2.00* Hernandez.83* Hernandez.75* Hernandez.83* Hernandez.55* Hernandez.35*  CALCIUM 9.7 8.9 8.0* 8.Hernandez* 8.8 8.6  MG  --  2.2 2.Hernandez  --   --   --   PHOS  --  4.3 3.9  --   --   --    Liver Function Tests:  Recent Labs Lab 09/10/2013 1930 09/17/13 0235 09/16/2013 0232  AST  144* 121* 45*  ALT 71* 69* 66*  ALKPHOS 168* 164* 271*  BILITOT 0.4 0.4 0.3  PROT 7.0 6.7 6.0  ALBUMIN 2.4* 2.4* 2.2*   No results found for this basename: LIPASE, AMYLASE,  in the last 168 hours No results found for this basename: AMMONIA,  in the last 168 hours CBC:  Recent Labs Lab 08/21/2013 1930 09/17/13 0235 09/17/13 1300 09/18/13 0234 09/04/2013 0232  WBC 21.0* 20.7* 13.3* 13.3* 11.2*  NEUTROABS 14.5*  --  10.9*  --   --   HGB 7.7* 8.8* 7.8* 8.2* 8.9*  HCT 25.Hernandez* 28.4* 25.6* 26.0* 28.9*  MCV 90.9 88.5 89.5 87.8  89.2  PLT 735* 581* 458* 443* 574*   Cardiac Enzymes:  Recent Labs Lab 09/07/2013 1930 09/17/13 0235 09/17/13 1300 09/15/2013 1020  CKTOTAL 9082*  --  4537* 945*  CKMB  --   --   --  4.Hernandez*  TROPONINI <0.30 <0.30  --   --    BNP (last 3 results)  Recent Labs  09/06/13 0035 08/26/2013 2025  PROBNP 1129.0* 4061.0*   CBG:  Recent Labs Lab 09/18/13 1535 09/18/13 1914 09/18/13 2337 09/11/2013 0342 09/19/13 0749  GLUCAP 120* 147* 93 127* 140*    Recent Results (from the past 240 hour(s))  CULTURE, BLOOD (ROUTINE X 2)     Status: None   Collection Time    09/04/2013  8:25 PM      Result Value Ref Range Status   Specimen Description BLOOD RIGHT FOREARM   Final   Special Requests BOTTLES DRAWN AEROBIC AND ANAEROBIC 5CC   Final   Culture  Setup Time     Final   Value: 09/17/2013 01:00     Performed at [facility]   Culture     Final   Value:        BLOOD CULTURE RECEIVED NO GROWTH TO DATE CULTURE WILL BE HELD FOR 5 DAYS BEFORE ISSUING A FINAL NEGATIVE REPORT     Performed at [facility]   Report Status PENDING   Incomplete  CULTURE, BLOOD (ROUTINE X 2)     Status: None   Collection Time    09/08/2013  8:32 PM      Result Value Ref Range Status   Specimen Description BLOOD HAND RIGHT   Final   Special Requests BOTTLES DRAWN AEROBIC AND ANAEROBIC 5CC   Final   Culture  Setup Time     Final   Value: 09/17/2013 01:00     Performed at [facility]   Culture     Final   Value:        BLOOD CULTURE RECEIVED NO GROWTH TO DATE CULTURE WILL BE HELD FOR 5 DAYS BEFORE ISSUING A FINAL NEGATIVE REPORT     Performed at [facility]   Report Status PENDING   Incomplete  URINE CULTURE     Status: None   Collection Time    09/08/2013  8:46 PM      Result Value Ref Range Status   Specimen Description URINE, CATHETERIZED   Final   Special Requests CX ADDED AT 0307 ON [id number]   Final   Culture  Setup Time     Final   Value: 09/17/2013 15:23     Performed at  [place]     Final   Value: NO GROWTH     Performed at [place]     Final  Value: NO GROWTH     Performed at [facility]   Report Status 09/18/2013 FINAL   Final  MRSA PCR SCREENING     Status: None   Collection Time    09/17/13  Hernandez:35 AM      Result Value Ref Range Status   MRSA by PCR NEGATIVE  NEGATIVE Final   Comment:            The GeneXpert MRSA Assay (FDA     approved for NASAL specimens     only), is one component of a     comprehensive MRSA colonization     surveillance program. It is not     intended to diagnose MRSA     infection nor to guide or     monitor treatment for     MRSA infections.     Studies:  Recent x-ray studies have been reviewed in detail by the Attending Physician  Time spent :  [place], ANP [facility] Office  [phone] Pager [phone]   **If unable to reach the above provider after paging please contact the [place] @ [phone]  On-Call/Text Page:      [email or web address]      password [email or web address]  If 7PM-7AM, please contact night-coverage [email or web address] Password [email or web address] 08/29/2013, 2:50 PM   LOS: 3 days   I have personally examined this patient and reviewed the entire database. I have reviewed the above note, made any necessary editorial changes, and agree with its content.  Cherene Altes, MD [facility]

## 2013-09-19 NOTE — Anesthesia Preprocedure Evaluation (Signed)
Anesthesia Evaluation  Patient identified by MRN, date of birth, ID band Patient awake    Reviewed: Allergy & Precautions, H&P , NPO status , Patient's Chart, lab work & pertinent test results  Airway Mallampati: II  Neck ROM: full    Dental   Pulmonary shortness of breath,          Cardiovascular hypertension, +CHF     Neuro/Psych Depression  Neuromuscular disease    GI/Hepatic   Endo/Other  diabetes, Type 2  Renal/GU ARFRenal disease     Musculoskeletal   Abdominal   Peds  Hematology   Anesthesia Other Findings   Reproductive/Obstetrics                           Anesthesia Physical Anesthesia Plan  ASA: III  Anesthesia Plan: MAC   Post-op Pain Management:    Induction: Intravenous  Airway Management Planned: Simple Face Mask  Additional Equipment:   Intra-op Plan:   Post-operative Plan:   Informed Consent: I have reviewed the patients History and Physical, chart, labs and discussed the procedure including the risks, benefits and alternatives for the proposed anesthesia with the patient or authorized representative who has indicated his/her understanding and acceptance.     Plan Discussed with: CRNA, Anesthesiologist and Surgeon  Anesthesia Plan Comments:         Anesthesia Quick Evaluation

## 2013-09-19 NOTE — Progress Notes (Signed)
  Echocardiogram  2D Echocardiogram has been performed.  Darlina Sicilian M 09/10/2013, 2:50 PM

## 2013-09-19 NOTE — Procedures (Signed)
Intubation Procedure Note [name] [id number] 1950-02-21  Procedure: Intubation Indications: Respiratory insufficiency  Procedure Details Consent: Risks of procedure as well as the alternatives and risks of each were explained to the (patient/caregiver).  Consent for procedure obtained. and Unable to obtain consent because of emergency and worsening movement issues and agitation and hypoxemic resp failure. Time Out: Verified patient identification, verified procedure, site/side was marked, verified correct patient position, special equipment/implants available, medications/allergies/relevent history reviewed, required imaging and test results available.  Performed  Maximum sterile technique was used including cap, gloves, gown, hand hygiene and mask.  MAC - glidescope use    Evaluation Hemodynamic Status: transient hypertension not needing Rx; O2 sats: stable throughout but transiently fell Patient's Current Condition: stable Complications: No apparent complications Patient did tolerate procedure well. Chest X-ray ordered to verify placement.  CXR: pending.   Spl notes   Patient with severe chorea and agitation. Needing fentanyl 475mcg and versed 4mg  and etomidate 30mg  and 80mg  IV rocurnoium. Small mouth and anterior cords made it tough to pass ET tube despite visualization. MAC blade removed patient re-bagged and then et tube readjsuted and inserted with success  Plan cxr  Needs deep sedation Might need paralytic    Dr. Brand Males, M.D., [facility].C.P Pulmonary and Critical Care Medicine Staff Physician [facility] Pager: [phone], If no answer or between  15:00h - 7:00h: call [phone]  09/04/2013 8:50 PM

## 2013-09-19 NOTE — Progress Notes (Signed)
Patient's respiratory status declined with respiratory rate in 60s and tachycardic. No relief after a total 10mg  Haldol and 2mg  Ativan. Patient intubated by CCM MD. Son was updated at bedside and phone call was attempted to husband but could not get a hold of him.   Lamar Sprinkles

## 2013-09-20 ENCOUNTER — Other Ambulatory Visit: Payer: BC Managed Care – PPO

## 2013-09-20 ENCOUNTER — Encounter (HOSPITAL_COMMUNITY): Payer: Self-pay | Admitting: Radiology

## 2013-09-20 ENCOUNTER — Inpatient Hospital Stay (HOSPITAL_COMMUNITY): Payer: BC Managed Care – PPO

## 2013-09-20 DIAGNOSIS — J9600 Acute respiratory failure, unspecified whether with hypoxia or hypercapnia: Secondary | ICD-10-CM

## 2013-09-20 DIAGNOSIS — G255 Other chorea: Secondary | ICD-10-CM

## 2013-09-20 LAB — CK TOTAL AND CKMB (NOT AT ARMC)
CK, MB: 5.2 ng/mL — AB (ref 0.3–4.0)
RELATIVE INDEX: 0.6 (ref 0.0–2.5)
Total CK: 816 U/L — ABNORMAL HIGH (ref 7–177)

## 2013-09-20 LAB — MYOGLOBIN, URINE: Myoglobin, Ur: 2730 mcg/L — ABNORMAL HIGH (ref <28)

## 2013-09-20 LAB — GLUCOSE, CAPILLARY
GLUCOSE-CAPILLARY: 170 mg/dL — AB (ref 70–99)
Glucose-Capillary: 95 mg/dL (ref 70–99)
Glucose-Capillary: 92 mg/dL (ref 70–99)
Glucose-Capillary: 115 mg/dL — ABNORMAL HIGH (ref 70–99)
Glucose-Capillary: 203 mg/dL — ABNORMAL HIGH (ref 70–99)

## 2013-09-20 MED ORDER — SODIUM CHLORIDE 0.9 % IV SOLN
250.0000 mg | Freq: Two times a day (BID) | INTRAVENOUS | Status: DC
  Administered 2013-09-20 – 2013-09-24 (×10): 250 mg via INTRAVENOUS
  Filled 2013-09-20 (×13): qty 2

## 2013-09-20 MED ORDER — DEXMEDETOMIDINE HCL IN NACL 200 MCG/50ML IV SOLN
0.0000 ug/kg/h | INTRAVENOUS | Status: DC
  Administered 2013-09-20: 0.4 ug/kg/h via INTRAVENOUS
  Administered 2013-09-20: 0.8 ug/kg/h via INTRAVENOUS
  Administered 2013-09-20: 1.1 ug/kg/h via INTRAVENOUS
  Filled 2013-09-20 (×3): qty 50

## 2013-09-20 MED ORDER — FUROSEMIDE 10 MG/ML IJ SOLN
40.0000 mg | Freq: Every day | INTRAMUSCULAR | Status: DC
  Administered 2013-09-20 – 2013-09-21 (×2): 40 mg via INTRAVENOUS
  Filled 2013-09-20 (×2): qty 4

## 2013-09-20 MED ORDER — DEXMEDETOMIDINE HCL IN NACL 400 MCG/100ML IV SOLN
0.4000 ug/kg/h | INTRAVENOUS | Status: DC
  Administered 2013-09-20 – 2013-09-21 (×3): 1.2 ug/kg/h via INTRAVENOUS
  Administered 2013-09-21 (×3): 1.1 ug/kg/h via INTRAVENOUS
  Administered 2013-09-22 (×2): 1.2 ug/kg/h via INTRAVENOUS
  Administered 2013-09-22: 1 ug/kg/h via INTRAVENOUS
  Administered 2013-09-22 – 2013-09-23 (×5): 1.2 ug/kg/h via INTRAVENOUS
  Administered 2013-09-24 (×5): 1.6 ug/kg/h via INTRAVENOUS
  Administered 2013-09-24: 1.2 ug/kg/h via INTRAVENOUS
  Administered 2013-09-25: 1.6 ug/kg/h via INTRAVENOUS
  Administered 2013-09-25: 1.596 ug/kg/h via INTRAVENOUS
  Administered 2013-09-25 – 2013-09-27 (×11): 1.6 ug/kg/h via INTRAVENOUS
  Filled 2013-09-20 (×2): qty 100
  Filled 2013-09-20: qty 200
  Filled 2013-09-20 (×21): qty 100
  Filled 2013-09-20: qty 200
  Filled 2013-09-20 (×6): qty 100
  Filled 2013-09-20: qty 200
  Filled 2013-09-20 (×9): qty 100

## 2013-09-20 MED ORDER — VITAL HIGH PROTEIN PO LIQD
1000.0000 mL | ORAL | Status: DC
  Administered 2013-09-20 – 2013-09-21 (×2): 1000 mL
  Administered 2013-09-22: 14:00:00
  Administered 2013-09-22 – 2013-09-23 (×2): 1000 mL
  Filled 2013-09-20 (×8): qty 1000

## 2013-09-20 MED ORDER — PRO-STAT SUGAR FREE PO LIQD
30.0000 mL | Freq: Four times a day (QID) | ORAL | Status: DC
  Administered 2013-09-20 – 2013-09-23 (×11): 30 mL
  Filled 2013-09-20 (×15): qty 30

## 2013-09-20 NOTE — Progress Notes (Signed)
PULMONARY / CRITICAL CARE MEDICINE  Name: [name] MRN: [id number] DOB: 09-02-1950    ADMISSION DATE:  08/30/2013 CONSULTATION DATE:  09/04/2013  REFERRING MD :  Thereasa Solo   CHIEF COMPLAINT:  Concern for airway protection   INITIAL PRESENTATION: 63 yo with three week history of choreiform movements presents 8/28 with worsening involuntary movements, metabolic acidosis, rhabdomyolysis  and AKI.   STUDIES / EVENTS:  8/28  Admitted 8/31  MRI brain >>>  bilateral basal ganglial altered signal intensity  8/31  Intubated in MRI secondary to oversedation, extubated up on arrival to ICU.  Re intubated for "severe chorea and hypoxemic resp failure" 8/31  TTE >>> EF 45%, grade 2 DD  INTERVAL HISTORY: Reintubated 8/31 due to uncontrolled movements / agitation.  Requires high dose Propofol.  VITAL SIGNS: Temp:  [97.8 F (36.6 C)-99 F (37.2 C)] 98.2 F (36.8 C) (09/01 0844) Pulse Rate:  [84-150] 89 (09/01 0831) Resp:  [8-49] 16 (09/01 0831) BP: (79-180)/(58-97) 147/77 mmHg (09/01 0831) SpO2:  [87 %-100 %] 100 % (09/01 0831) FiO2 (%):  [40 %-100 %] 40 % (09/01 0831) Weight:  [80.7 kg (177 lb 14.6 oz)] 80.7 kg (177 lb 14.6 oz) (09/01 0500)  HEMODYNAMICS:   VENTILATOR SETTINGS: Vent Mode:  [-] PRVC FiO2 (%):  [40 %-100 %] 40 % Set Rate:  [16 bmp] 16 bmp Vt Set:  [480 mL-500 mL] 480 mL PEEP:  [5 cmH20] 5 cmH20 Plateau Pressure:  [16 cmH20-18 cmH20] 16 cmH20  INTAKE / OUTPUT: Intake/Output     08/31 0701 - 09/01 0700 09/01 0701 - 09/02 0700   P.O.     I.V. (mL/kg) 2379.4 (29.5) 125 (1.5)   NG/GT  30   Total Intake(mL/kg) 2379.4 (29.5) 155 (1.9)   Urine (mL/kg/hr) 1025 (0.5)    Total Output 1025     Net +1354.4 +155        Stool Occurrence 1 x      PHYSICAL EXAMINATION: General: Resting comfortable Neuro: Well sedated, cough, gag diminished HEENT: OETT Cardiovascular: Regular, no murmurs Lungs: Bilateral air entry, scattered rhonchi Abdomen:  Soft, bowel sounds  present Musculoskeletal: Anasarca Skin: Intact  LABS:  CBC  Recent Labs Lab 09/17/13 1300 09/18/13 0234 08/23/2013 0232  WBC 13.3* 13.3* 11.2*  HGB 7.8* 8.2* 8.9*  HCT 25.6* 26.0* 28.9*  PLT 458* 443* 574*   Coag's  Recent Labs Lab 09/17/13 0550  APTT 24  INR 1.14   BMET  Recent Labs Lab 09/17/13 1300 09/18/13 0540 08/20/2013 0232  NA 142 144 144  K 4.7 4.3 4.2  CL 108 109 110  CO2 17* 21 18*  BUN 33* 26* 22  CREATININE 1.83* 1.55* 1.35*  GLUCOSE 163* 138* 135*   Electrolytes  Recent Labs Lab 09/17/13 0235 09/17/13 0550 09/17/13 1300 09/18/13 0540 09/04/2013 0232  CALCIUM 8.9 8.0* 8.1* 8.8 8.6  MG 2.2 2.1  --   --   --   PHOS 4.3 3.9  --   --   --    Sepsis Markers  Recent Labs Lab 09/17/13 0235 09/17/13 1300 09/18/13 0540  LATICACIDVEN 2.2 1.8 1.7   ABG  Recent Labs Lab 09/18/2013 2328 08/21/2013 2211  PHART 7.370 7.339*  PCO2ART 35.6 32.0*  PO2ART 78.0* 287.0*   Liver Enzymes  Recent Labs Lab 09/10/2013 1930 09/17/13 0235 08/27/2013 0232  AST 144* 121* 45*  ALT 71* 69* 66*  ALKPHOS 168* 164* 271*  BILITOT 0.4 0.4 0.3  ALBUMIN 2.4* 2.4* 2.2*  Cardiac Enzymes  Recent Labs Lab 09/14/2013 1930 09/07/2013 2025 09/17/13 0235  TROPONINI <0.30  --  <0.30  PROBNP  --  4061.0*  --    Glucose  Recent Labs Lab 08/20/2013 0749 09/03/2013 1535 08/30/2013 2015 08/24/2013 2350 09/20/13 0419 09/20/13 0812  GLUCAP 140* 143* 151* 170* 95 92   IMAGING:   Mr Brain Wo Contrast  09/06/2013   CLINICAL DATA:  Three-week history of choreiform movements. Diabetic hypertensive hyperlipidemia CT 63 year old female.  EXAM: MRI HEAD WITHOUT CONTRAST  TECHNIQUE: Multiplanar, multiecho pulse sequences of the brain and surrounding structures were obtained without intravenous contrast.  COMPARISON:  08/18/2013 MR. 08/17/2013 CT.  FINDINGS: No acute infarct.  Interval development of altered signal intensity involving the basal ganglia bilaterally greater on the  right with T1 hyperintense signal with mild surrounding T2 altered signal intensity. This type of finding has been described in patients with; hepatic encephalopathy, secondary to hyperglycemia, result of hyperalimentation, hypoxic ischemic encephalopathy, endocrine dysfunction, copper overload and less likely carbon monoxide poisoning. Microangiopathy of AIDS felt unlikely given patient's lab results.  No intracranial mass lesion noted on this unenhanced exam.  No hydrocephalus.  Mild small vessel disease type changes.  Mild exophthalmos.  Post bilateral lens replacement.  Major intracranial vascular structures are patent.  Mild transverse ligament hypertrophy.  IMPRESSION: Bilateral basal ganglial altered signal intensity has developed since the prior MR as discussed above. This may reflect result of hepatic encephalopathy, secondary to hyperglycemia, result of hyperalimentation, hypoxic ischemic encephalopathy, endocrine dysfunction, copper overload (labs pending) and less likely carbon monoxide poisoning.  These results will be called to the ordering clinician or representative by the Radiologist Assistant, and communication documented in the PACS or zVision Dashboard.   Electronically Signed   By: Chauncey Cruel M.D.   On: 09/14/2013 14:51   Dg Chest Port 1 View  08/26/2013   CLINICAL DATA:  Endotracheal tube placement  EXAM: PORTABLE CHEST - 1 VIEW  COMPARISON:  08/21/2013 at 1501 hr  FINDINGS: Endotracheal tube tip now 2.5 cm above the carina. No change in the presence of extensive left lower lobe consolidation and possibly a left effusion. Mild right lower lobe infiltrate stable. NG tube crosses the gastroesophageal junction.  IMPRESSION: Endotracheal tube as described with bilateral opacities   Electronically Signed   By: Skipper Cliche M.D.   On: 09/14/2013 21:23   Dg Chest Port 1 View  09/02/2013   CLINICAL DATA:  Endotracheal tube placement  EXAM: PORTABLE CHEST - 1 VIEW  COMPARISON:  Portable exam  1501 hr compared to 09/10/2013  FINDINGS: Tip of endotracheal tube projects 3.7 cm above carina.  Rotated to the LEFT.  Upper normal heart size.  Pulmonary vascular congestion.  Bibasilar atelectasis and suspected LEFT pleural effusion.  Probable LEFT perihilar infiltrate versus edema.  No pneumothorax.  IMPRESSION: Is endotracheal tube tip projects 3.7 cm above carina.  Bibasilar atelectasis, probable LEFT pleural effusion and questionable LEFT perihilar infiltrate versus edema.   Electronically Signed   By: Lavonia Dana M.D.   On: 08/22/2013 15:19   Dg Abd Portable 1v  08/28/2013   CLINICAL DATA:  Orogastric tube placement  EXAM: PORTABLE ABDOMEN - 1 VIEW  COMPARISON:  None.  FINDINGS: Orogastric tube projects in the left upper quadrant over the anticipated position of the stomach.  IMPRESSION: Orogastric tube as described   Electronically Signed   By: Skipper Cliche M.D.   On: 09/19/2013 21:27   ASSESSMENT / PLAN:  PULMONARY A:  Acute respiratory failure, intubated for extreme agitation 8/31 ( intubated earlier same day for procedural sedation and then extubated ) P: Goal pH>7.30, SpO2>92 Continuous mechanical support VAP bundle Daily SBT Trend ABG/CXR  CARDIOVASCULAR A:  Chronic systolic / diastolic congestive heart failure ( EF 45%, grade 2 DD 8/31 ) HTN P:  MAP goal > 65 mmHg Hydralazine PRN  RENAL A:  AKI, improving Rhabdomyolysis, improving Fluid overload P:   Trend BMP D/c IVF Start Lasix 40 daily  GASTROINTESTINAL A:   Abnormal LFTs, improving Nutrition GIPx P:   NPO Protonix TF per Nutritionist  HEMATOLOGIC A:   Anemia VTE Px P:  Trend CBC Heparin [place]  INFECTIOUS A:   No overt infection  P:   Follow blood / urine cx  ENDOCRINE A:   Diabetes  P:   SSI Lantus 5  NEUROLOGIC A:   Newly diagnosed movement disorder Acute encephalopathy  P:   RASS goal 0 to -1 Work up per Neurology Start Precedex Wean Propofol Klonopin, Amantadine Versed  PRN Solu-Medrol per Neurology  I have personally obtained history, examined patient, evaluated and interpreted laboratory and imaging results, reviewed medical records, formulated assessment / plan and placed orders.  CRITICAL CARE:  The patient is critically ill with multiple organ systems failure and requires high complexity decision making for assessment and support, frequent evaluation and titration of therapies, application of advanced monitoring technologies and extensive interpretation of multiple databases. Critical Care Time devoted to patient care services described in this note is 35 minutes.   Doree Fudge, MD Pulmonary and [place] Pager: [phone]  09/20/2013, 10:29 AM

## 2013-09-20 NOTE — Procedures (Signed)
Central Venous Catheter Insertion Procedure Note SHONDRA CAPPS [id number] September 19, 1950  Procedure: Insertion of Central Venous Catheter Indications: Drug and/or fluid administration and Frequent blood sampling  Procedure Details Consent: Risks of procedure as well as the alternatives and risks of each were explained to the (patient/caregiver).  Consent for procedure obtained. Time Out: Verified patient identification, verified procedure, site/side was marked, verified correct patient position, special equipment/implants available, medications/allergies/relevent history reviewed, required imaging and test results available.  Performed  Maximum sterile technique was used including antiseptics, cap, gloves, gown, hand hygiene, mask and sheet. Skin prep: Chlorhexidine; local anesthetic administered A antimicrobial bonded/coated triple lumen catheter was placed in the left internal jugular vein using the Seldinger technique. Ultrasound guidance used.Yes.   Catheter placed to 18 cm. Blood aspirated via all 3 ports and then flushed x 3. Line sutured x 2 and dressing applied.  Evaluation Blood flow good Complications: No apparent complications Patient did tolerate procedure well. Chest X-ray ordered to verify placement.  CXR: pending.  Georgann Housekeeper, ACNP [facility] Pulmonology/Critical Care Pager [phone] or [phone]

## 2013-09-20 NOTE — Progress Notes (Signed)
INITIAL NUTRITION ASSESSMENT  DOCUMENTATION CODES Per approved criteria  -Not Applicable   INTERVENTION:  Utilize 3M PEPuP Protocol: initiate TF via OGT  with Vital HP at 25 ml/h and Prostat 30 ml QID on day 1; on day 2, continue at goal rate of 25 ml/h (600 ml per day) to provide 1000 kcals, 113 gm protein, 502 ml free water daily.  TF plus Propofol will provide a total of 1660 kcals per day (106% of estimated needs).  NUTRITION DIAGNOSIS: Inadequate oral intake related to inability to eat as evidenced by NPO status.   Goal: Pt to meet >/= 90% of their estimated nutrition needs   Monitor:  TF regimen & tolerance, respiratory status, weight, labs, I/O's  Reason for Assessment: Consult, Vent   Admitting Dx: dyspnea  ASSESSMENT: 63 year old woman with underlying history of diabetes mellitus and recent new diagnosis of systolic heart failure. She initially presented for admission on 7-30 due to complaints of weakness of the lower extremities and right facial numbness and slurred speech. Patient required intubation for MRI yesterday. Was extubated but later in evening had respiratory decline and required re-intubation.   Patient is currently intubated on ventilator support MV: 7.8 L/min Temp (24hrs), Avg:98.3 F (36.8 C), Min:97.8 F (36.6 C), Max:99 F (37.2 C)  Propofol: 25 ml/hr, providing 660 fat kcals  Nutrition Focused Physical Exam:  Subcutaneous Fat:  Orbital Region: WNL Upper Arm Region: WNL Thoracic and Lumbar Region: WNL  Muscle:  Temple Region: WNL Clavicle Bone Region: WNL Clavicle and Acromion Bone Region: WNL Scapular Bone Region: WNL Dorsal Hand: WNL Patellar Region: WNL Anterior Thigh Region: WNL Posterior Calf Region: moderate depletion  Edema: no LE edema    Height: Ht Readings from Last 1 Encounters:  09/17/13 5\' 6"  (1.676 m)    Weight: Wt Readings from Last 1 Encounters:  09/20/13 177 lb 14.6 oz (80.7 kg)    Ideal Body Weight: 130  lb  % Ideal Body Weight: 136%  Wt Readings from Last 10 Encounters:  09/20/13 177 lb 14.6 oz (80.7 kg)  09/20/13 177 lb 14.6 oz (80.7 kg)  09/14/13 166 lb 11.2 oz (75.615 kg)  08/29/13 182 lb (82.555 kg)  08/20/13 173 lb 8 oz (78.7 kg)  04/25/13 186 lb (84.369 kg)  11/22/12 172 lb (78.019 kg)  08/12/12 174 lb (78.926 kg)  06/25/12 172 lb (78.019 kg)  05/10/12 182 lb (82.555 kg)    Usual Body Weight: variable  % Usual Body Weight: NA  BMI:  Body mass index is 28.73 kg/(m^2).  Estimated Nutritional Needs: Kcal: 1561 Protein: 110-120g Fluid: >1.5L/day  Skin: intact  Diet Order: NPO  EDUCATION NEEDS: -No education needs identified at this time   Intake/Output Summary (Last 24 hours) at 09/20/13 1212 Last data filed at 09/20/13 0800  Gross per 24 hour  Intake 2159.4 ml  Output    850 ml  Net 1309.4 ml    Last BM: 8/31   Labs:   Recent Labs Lab 08/27/2013 1930 09/17/13 0235 09/17/13 0550 09/17/13 1300 09/18/13 0540 09/18/2013 0232  NA 140 141 141 142 144 144  K 4.8 4.3 4.7 4.7 4.3 4.2  CL 102 103 108 108 109 110  CO2 13* 20 17* 17* 21 18*  BUN 34* 34* 33* 33* 26* 22  CREATININE 2.00* 1.83* 1.75* 1.83* 1.55* 1.35*  CALCIUM 9.7 8.9 8.0* 8.1* 8.8 8.6  MG  --  2.2 2.1  --   --   --   PHOS  --  4.3 3.9  --   --   --   GLUCOSE 161* 154* 175* 163* 138* 135*    CBG (last 3)   Recent Labs  09/07/2013 2350 09/20/13 0419 09/20/13 0812  GLUCAP 170* 95 92    Scheduled Meds: . amantadine  100 mg Oral BID  . antiseptic oral rinse  7 mL Mouth Rinse QID  . aspirin EC  81 mg Oral Daily  . chlorhexidine  15 mL Mouth Rinse BID  . clonazePAM  2 mg Oral TID  . furosemide  40 mg Intravenous Daily  . heparin  5,000 Units Subcutaneous 3 times per day  . insulin aspart  0-9 Units Subcutaneous 6 times per day  . insulin glargine  5 Units Subcutaneous QHS  . methylPREDNISolone (SOLU-MEDROL) injection  250 mg Intravenous Q12H  . pantoprazole (PROTONIX) IV  40 mg  Intravenous Daily  . tetrahydrozoline  2 drop Both Eyes BID    Continuous Infusions: . dexmedetomidine    . propofol 50 mcg/kg/min (09/20/13 1100)    Past Medical History  Diagnosis Date  . Hypertension   . Diabetes mellitus   . Hyperlipidemia   . History of noncompliance with medical treatment   . Shingles 08/18/2013    Past Surgical History  Procedure Laterality Date  . Diabetes    . Fibroids of breast    . Radiology with anesthesia N/A 08/23/2013    Procedure: MRI OF BRAIN;  Surgeon: Medication Radiologist, MD;  Location: [facility];  Service: Radiology;  Laterality: N/A;    Clayton Bibles, MS, PLDN Provisionally Licensed Dietitian Nutritionist Pager: [phone]   Chart and note reviewed. Agree with note.  Molli Barrows, RD, LDN, [place] Pager [phone] After Hours Pager [phone]

## 2013-09-20 NOTE — Progress Notes (Signed)
Subjective: Patient required intubation for MRI yesterday.  Was extubated but later in evening had respiratory decline and required re-intubation.    Objective: Current vital signs: BP 147/77  Pulse 89  Temp(Src) 98.2 F (36.8 C) (Oral)  Resp 16  Ht 5\' 6"  (1.676 m)  Wt 80.7 kg (177 lb 14.6 oz)  BMI 28.73 kg/m2  SpO2 100% Vital signs in last 24 hours: Temp:  [97.8 F (36.6 C)-99 F (37.2 C)] 98.2 F (36.8 C) (09/01 0844) Pulse Rate:  [84-150] 89 (09/01 0831) Resp:  [8-49] 16 (09/01 0831) BP: (79-180)/(58-97) 147/77 mmHg (09/01 0831) SpO2:  [87 %-100 %] 100 % (09/01 0831) FiO2 (%):  [40 %-100 %] 40 % (09/01 0831) Weight:  [80.7 kg (177 lb 14.6 oz)] 80.7 kg (177 lb 14.6 oz) (09/01 0500)  Intake/Output from previous day: 08/31 0701 - 09/01 0700 In: 2379.4 [I.V.:2379.4] Out: 5643 [Urine:1025] Intake/Output this shift: Total I/O In: 155 [I.V.:125; NG/GT:30] Out: -  Nutritional status: NPO  Neurologic Exam: Mental Status: With stimulation choreiform movements start.  Patient otherwise unresponsive.  Does not follow commands.   Cranial Nerves: II: patient does not respond confrontation bilaterally, pupils right 3 mm, left 2 mm,and unreactive bilaterally III,IV,VI: doll's response present bilaterally.  V,VII: corneal reflex reduced bilaterally  VIII: patient does not respond to verbal stimuli IX,X: gag reflex unable to be tested, XI: trapezius strength unable to test bilaterally XII: tongue strength unable to test Motor: Choreiform movements noted throughout Sensory: With stimulation begins involuntary movements and patient unable to continue to be tested Deep Tendon Reflexes:  2+ throughout. Cerebellar: Unable to perform    Lab Results: Basic Metabolic Panel:  Recent Labs Lab 08/22/2013 1930 09/17/13 0235 09/17/13 0550 09/17/13 1300 09/18/13 0540 09/16/2013 0232  NA 140 141 141 142 144 144  K 4.8 4.3 4.7 4.7 4.3 4.2  CL 102 103 108 108 109 110  CO2 13* 20  17* 17* 21 18*  GLUCOSE 161* 154* 175* 163* 138* 135*  BUN 34* 34* 33* 33* 26* 22  CREATININE 2.00* 1.83* 1.75* 1.83* 1.55* 1.35*  CALCIUM 9.7 8.9 8.0* 8.1* 8.8 8.6  MG  --  2.2 2.1  --   --   --   PHOS  --  4.3 3.9  --   --   --     Liver Function Tests:  Recent Labs Lab 08/30/2013 1930 09/17/13 0235 09/02/2013 0232  AST 144* 121* 45*  ALT 71* 69* 66*  ALKPHOS 168* 164* 271*  BILITOT 0.4 0.4 0.3  PROT 7.0 6.7 6.0  ALBUMIN 2.4* 2.4* 2.2*   No results found for this basename: LIPASE, AMYLASE,  in the last 168 hours No results found for this basename: AMMONIA,  in the last 168 hours  CBC:  Recent Labs Lab 08/27/2013 1930 09/17/13 0235 09/17/13 1300 09/18/13 0234 09/18/2013 0232  WBC 21.0* 20.7* 13.3* 13.3* 11.2*  NEUTROABS 14.5*  --  10.9*  --   --   HGB 7.7* 8.8* 7.8* 8.2* 8.9*  HCT 25.1* 28.4* 25.6* 26.0* 28.9*  MCV 90.9 88.5 89.5 87.8 89.2  PLT 735* 581* 458* 443* 574*    Cardiac Enzymes:  Recent Labs Lab 09/11/2013 1930 09/17/13 0235 09/17/13 1300 09/04/2013 1020 09/20/13 0300  CKTOTAL 9082*  --  4537* 945* 816*  CKMB  --   --   --  4.1* 5.2*  TROPONINI <0.30 <0.30  --   --   --     Lipid Panel:  Recent Labs Lab  08/24/2013 2053  TRIG 245*    CBG:  Recent Labs Lab 09/16/2013 1535 08/25/2013 2015 09/03/2013 2350 09/20/13 0419 09/20/13 0812  GLUCAP 143* 151* 170* 95 92    Microbiology: Results for orders placed during the hospital encounter of 08/27/2013  CULTURE, BLOOD (ROUTINE X 2)     Status: None   Collection Time    09/02/2013  8:25 PM      Result Value Ref Range Status   Specimen Description BLOOD RIGHT FOREARM   Final   Special Requests BOTTLES DRAWN AEROBIC AND ANAEROBIC 5CC   Final   Culture  Setup Time     Final   Value: 09/17/2013 01:00     Performed at [facility]   Culture     Final   Value:        BLOOD CULTURE RECEIVED NO GROWTH TO DATE CULTURE WILL BE HELD FOR 5 DAYS BEFORE ISSUING A FINAL NEGATIVE REPORT     Performed at  [facility]   Report Status PENDING   Incomplete  CULTURE, BLOOD (ROUTINE X 2)     Status: None   Collection Time    08/25/2013  8:32 PM      Result Value Ref Range Status   Specimen Description BLOOD HAND RIGHT   Final   Special Requests BOTTLES DRAWN AEROBIC AND ANAEROBIC 5CC   Final   Culture  Setup Time     Final   Value: 09/17/2013 01:00     Performed at [facility]   Culture     Final   Value:        BLOOD CULTURE RECEIVED NO GROWTH TO DATE CULTURE WILL BE HELD FOR 5 DAYS BEFORE ISSUING A FINAL NEGATIVE REPORT     Performed at [facility]   Report Status PENDING   Incomplete  URINE CULTURE     Status: None   Collection Time    08/28/2013  8:46 PM      Result Value Ref Range Status   Specimen Description URINE, CATHETERIZED   Final   Special Requests CX ADDED AT 0307 ON [id number]   Final   Culture  Setup Time     Final   Value: 09/17/2013 15:23     Performed at [place]     Final   Value: NO GROWTH     Performed at [facility]   Culture     Final   Value: NO GROWTH     Performed at [facility]   Report Status 09/18/2013 FINAL   Final  MRSA PCR SCREENING     Status: None   Collection Time    09/17/13  1:35 AM      Result Value Ref Range Status   MRSA by PCR NEGATIVE  NEGATIVE Final   Comment:            The GeneXpert MRSA Assay (FDA     approved for NASAL specimens     only), is one component of a     comprehensive MRSA colonization     surveillance program. It is not     intended to diagnose MRSA     infection nor to guide or     monitor treatment for     MRSA infections.    Coagulation Studies: No results found for this basename: LABPROT, INR,  in the last 72 hours  Imaging: Mr Brain Wo Contrast  08/29/2013  CLINICAL DATA:  Three-week history of choreiform movements. Diabetic hypertensive hyperlipidemia CT 63 year old female.  EXAM: MRI HEAD WITHOUT CONTRAST  TECHNIQUE: Multiplanar, multiecho  pulse sequences of the brain and surrounding structures were obtained without intravenous contrast.  COMPARISON:  08/18/2013 MR. 08/17/2013 CT.  FINDINGS: No acute infarct.  Interval development of altered signal intensity involving the basal ganglia bilaterally greater on the right with T1 hyperintense signal with mild surrounding T2 altered signal intensity. This type of finding has been described in patients with; hepatic encephalopathy, secondary to hyperglycemia, result of hyperalimentation, hypoxic ischemic encephalopathy, endocrine dysfunction, copper overload and less likely carbon monoxide poisoning. Microangiopathy of AIDS felt unlikely given patient's lab results.  No intracranial mass lesion noted on this unenhanced exam.  No hydrocephalus.  Mild small vessel disease type changes.  Mild exophthalmos.  Post bilateral lens replacement.  Major intracranial vascular structures are patent.  Mild transverse ligament hypertrophy.  IMPRESSION: Bilateral basal ganglial altered signal intensity has developed since the prior MR as discussed above. This may reflect result of hepatic encephalopathy, secondary to hyperglycemia, result of hyperalimentation, hypoxic ischemic encephalopathy, endocrine dysfunction, copper overload (labs pending) and less likely carbon monoxide poisoning.  These results will be called to the ordering clinician or representative by the Radiologist Assistant, and communication documented in the PACS or zVision Dashboard.   Electronically Signed   By: Chauncey Cruel M.D.   On: 08/28/2013 14:51   Dg Chest Port 1 View  09/01/2013   CLINICAL DATA:  Endotracheal tube placement  EXAM: PORTABLE CHEST - 1 VIEW  COMPARISON:  09/15/2013 at 1501 hr  FINDINGS: Endotracheal tube tip now 2.5 cm above the carina. No change in the presence of extensive left lower lobe consolidation and possibly a left effusion. Mild right lower lobe infiltrate stable. NG tube crosses the gastroesophageal junction.   IMPRESSION: Endotracheal tube as described with bilateral opacities   Electronically Signed   By: Skipper Cliche M.D.   On: 09/04/2013 21:23   Dg Chest Port 1 View  08/22/2013   CLINICAL DATA:  Endotracheal tube placement  EXAM: PORTABLE CHEST - 1 VIEW  COMPARISON:  Portable exam 1501 hr compared to 09/05/2013  FINDINGS: Tip of endotracheal tube projects 3.7 cm above carina.  Rotated to the LEFT.  Upper normal heart size.  Pulmonary vascular congestion.  Bibasilar atelectasis and suspected LEFT pleural effusion.  Probable LEFT perihilar infiltrate versus edema.  No pneumothorax.  IMPRESSION: Is endotracheal tube tip projects 3.7 cm above carina.  Bibasilar atelectasis, probable LEFT pleural effusion and questionable LEFT perihilar infiltrate versus edema.   Electronically Signed   By: Lavonia Dana M.D.   On: 08/29/2013 15:19   Dg Abd Portable 1v  09/17/2013   CLINICAL DATA:  Orogastric tube placement  EXAM: PORTABLE ABDOMEN - 1 VIEW  COMPARISON:  None.  FINDINGS: Orogastric tube projects in the left upper quadrant over the anticipated position of the stomach.  IMPRESSION: Orogastric tube as described   Electronically Signed   By: Skipper Cliche M.D.   On: 09/12/2013 21:27    Medications:  I have reviewed the patient's current medications. Scheduled: . amantadine  100 mg Oral BID  . antiseptic oral rinse  7 mL Mouth Rinse QID  . aspirin EC  81 mg Oral Daily  . chlorhexidine  15 mL Mouth Rinse BID  . clonazePAM  2 mg Oral TID  . heparin  5,000 Units Subcutaneous 3 times per day  . insulin aspart  0-9 Units Subcutaneous 6  times per day  . insulin glargine  5 Units Subcutaneous QHS  . pantoprazole (PROTONIX) IV  40 mg Intravenous Daily  . tetrahydrozoline  2 drop Both Eyes BID    Assessment/Plan: MRI of the brain reviewed and shows interval development of bilateral BG altered signal intensity.  Although some hepatic dysfunction noted does not appear to be to the degree that would cause the  above findings.  ANA neg, ACE neg, thyroid studies normal, anticardiolipin antibodies normal, HIV neg, B12 normal, folate normal, Magnesium, normal, Copper studies are pending.  Serum sent for genetic testing.   Since it now appears that choreiform movements are not related to medications may also consider the use of antipsychotics for help in control of movements, although Haldol was not helpful yesterday.    Recommendations: 1.  PTH, RPR, Lyme titer, B1, strep antibodies 2.  Solumedrol 250mg  q 12 hours    LOS: 4 days   Alexis Goodell, MD [facility] [phone] 09/20/2013  8:51 AM

## 2013-09-20 NOTE — Procedures (Signed)
Supervised and present through the entire procedure.  Johnpatrick Jenny, MD Pulmonary and Critical Care Medicine [facility] Pager: [phone]  

## 2013-09-20 DEATH — deceased

## 2013-09-21 ENCOUNTER — Telehealth: Payer: Self-pay | Admitting: Neurology

## 2013-09-21 ENCOUNTER — Inpatient Hospital Stay (HOSPITAL_COMMUNITY): Payer: BC Managed Care – PPO

## 2013-09-21 LAB — BASIC METABOLIC PANEL
Anion gap: 13 (ref 5–15)
BUN: 27 mg/dL — AB (ref 6–23)
CO2: 21 meq/L (ref 19–32)
Calcium: 8.8 mg/dL (ref 8.4–10.5)
Chloride: 111 mEq/L (ref 96–112)
Creatinine, Ser: 1.42 mg/dL — ABNORMAL HIGH (ref 0.50–1.10)
GFR calc Af Amer: 44 mL/min — ABNORMAL LOW (ref >90)
GFR, EST NON AFRICAN AMERICAN: 38 mL/min — AB (ref >90)
Glucose, Bld: 162 mg/dL — ABNORMAL HIGH (ref 70–99)
Potassium: 4 mEq/L (ref 3.7–5.3)
Sodium: 145 mEq/L (ref 137–147)

## 2013-09-21 LAB — GLUCOSE, CAPILLARY
GLUCOSE-CAPILLARY: 291 mg/dL — AB (ref 70–99)
Glucose-Capillary: 190 mg/dL — ABNORMAL HIGH (ref 70–99)
Glucose-Capillary: 257 mg/dL — ABNORMAL HIGH (ref 70–99)
Glucose-Capillary: 302 mg/dL — ABNORMAL HIGH (ref 70–99)
Glucose-Capillary: 317 mg/dL — ABNORMAL HIGH (ref 70–99)
Glucose-Capillary: 358 mg/dL — ABNORMAL HIGH (ref 70–99)
Glucose-Capillary: 99 mg/dL (ref 70–99)

## 2013-09-21 LAB — RPR

## 2013-09-21 LAB — CBC
HEMATOCRIT: 28 % — AB (ref 36.0–46.0)
Hemoglobin: 8.6 g/dL — ABNORMAL LOW (ref 12.0–15.0)
MCH: 28 pg (ref 26.0–34.0)
MCHC: 30.7 g/dL (ref 30.0–36.0)
MCV: 91.2 fL (ref 78.0–100.0)
Platelets: 455 10*3/uL — ABNORMAL HIGH (ref 150–400)
RBC: 3.07 MIL/uL — AB (ref 3.87–5.11)
RDW: 15.1 % (ref 11.5–15.5)
WBC: 8.4 10*3/uL (ref 4.0–10.5)

## 2013-09-21 LAB — CARBOXYHEMOGLOBIN
Carboxyhemoglobin: 1.2 % (ref 0.5–1.5)
METHEMOGLOBIN: 1.1 % (ref 0.0–1.5)
O2 Saturation: 71.7 %
TOTAL HEMOGLOBIN: 8.6 g/dL — AB (ref 12.0–16.0)

## 2013-09-21 LAB — PARATHYROID HORMONE, INTACT (NO CA): PTH: 102 pg/mL — AB (ref 14–64)

## 2013-09-21 LAB — CERULOPLASMIN: Ceruloplasmin: 47 mg/dL (ref 18–53)

## 2013-09-21 LAB — B. BURGDORFI ANTIBODIES: B BURGDORFERI AB IGG+ IGM: 0.38 {ISR}

## 2013-09-21 MED ORDER — INSULIN GLARGINE 100 UNIT/ML ~~LOC~~ SOLN
15.0000 [IU] | Freq: Every day | SUBCUTANEOUS | Status: DC
  Administered 2013-09-21 – 2013-09-22 (×2): 15 [IU] via SUBCUTANEOUS
  Filled 2013-09-21 (×3): qty 0.15

## 2013-09-21 MED ORDER — IOHEXOL 300 MG/ML  SOLN: 25.0000 mL | INTRAMUSCULAR | Status: DC

## 2013-09-21 MED ORDER — FUROSEMIDE 10 MG/ML IJ SOLN
40.0000 mg | Freq: Two times a day (BID) | INTRAMUSCULAR | Status: DC
  Administered 2013-09-21: 40 mg via INTRAVENOUS
  Filled 2013-09-21 (×3): qty 4

## 2013-09-21 MED ORDER — IOHEXOL 300 MG/ML  SOLN
25.0000 mL | INTRAMUSCULAR | Status: AC
  Administered 2013-09-21 (×2): 25 mL via ORAL

## 2013-09-21 MED ORDER — IOHEXOL 300 MG/ML  SOLN: 100.0000 mL | Freq: Once | INTRAMUSCULAR | Status: AC | PRN

## 2013-09-21 NOTE — Progress Notes (Addendum)
Subjective/events: Changed form propofol to precedex.  started on solumedrol  Exam: Filed Vitals:   09/21/13 0700  BP: 95/21  Pulse: 66  Temp:   Resp: 17   Gen: In bed, NAD MS: sedated, and on awakening has involuntary appearing writhing movements, dos not follow command ON:GEXBM, blinks to eyelid stim bilaterally.  Motor: Moves all extremities with writhing like movements Sensory:responds to nox stim x 4.    MRI - bilateral basal ganglia T2 change  Impression: 63 yo F with sudden onset choreiform movements. I agree with starting solumedrol given the timecourse of the movements, autoimmune etiology is more likely. This can be a paraneoplastic syndrome and therefore will get ct chest/abd/pelvis  Recommendations: 1) continue solumedrol 250mg  Q12H 2) continue sedation as needed.  3) will fu labwork.  4) ct chest/abd/pelvis.   Roland Rack, MD [facility] [phone]  If 7pm- 7am, please page neurology on call as listed in [place].

## 2013-09-21 NOTE — Progress Notes (Signed)
Patient ID: [name], female   DOB: September 27, 1950, 63 y.o.   MRN: [id number]   Pt in CT now with IV site at Rt antecubital space 80 cc contrast extravasation superior to site  Minimal swelling noted Soft; no redness 2+ pulses at wrist Skin intact  Order for elevation and ice to area.  Will recheck in am

## 2013-09-21 NOTE — Plan of Care (Signed)
Problem: Phase I Progression Outcomes Goal: GIProphysixis Outcome: Completed/Met Date Met:  09/21/13 Ptotonix

## 2013-09-21 NOTE — Plan of Care (Signed)
Problem: Consults Goal: Ventilated Patients Patient Education See Patient Education Module for education specifics.  Outcome: Progressing Patient unable to learn. Family educated.

## 2013-09-21 NOTE — Progress Notes (Signed)
Inpatient Diabetes Program Recommendations  AACE/ADA: New Consensus Statement on Inpatient Glycemic Control (2013)  Target Ranges:  Prepandial:   less than 140 mg/dL      Peak postprandial:   less than 180 mg/dL (1-2 hours)      Critically ill patients:  140 - 180 mg/dL   Reason for Assessment:  Results for [name], [name] (MRN [id number]) as of 09/21/2013 09:37  Ref. Range 09/20/2013 15:17 09/20/2013 19:40 09/21/2013 00:10 09/21/2013 04:12 09/21/2013 08:07  Glucose-Capillary Latest Range: 70-99 mg/dL 203 (H) 358 (H) 99 257 (H) 190 (H)   Diabetes history:Type 2 diabetes Outpatient Diabetes medications: Lantus 8 units bid, Novolog Current orders for Inpatient glycemic control: Novolog sensitive q 4 hours, Lantus 5 units daily  Note patient is now on high dose IV steroids.  May consider ICU glycemic control protocol/IV insulin due to wide variations in CBG's. Will follow.  Thanks, Adah Perl, RN, BC-ADM Inpatient Diabetes Coordinator Pager [phone]

## 2013-09-21 NOTE — Progress Notes (Signed)
PULMONARY / CRITICAL CARE MEDICINE  Name: [name] MRN: [id number] DOB: 1950-11-02    ADMISSION DATE:  09/03/2013 CONSULTATION DATE:  08/25/2013  REFERRING MD :  Thereasa Solo   CHIEF COMPLAINT:  Concern for airway protection   INITIAL PRESENTATION: 63 yo with three week history of choreiform movements presents 8/28 with worsening involuntary movements, metabolic acidosis, rhabdomyolysis  and AKI.   STUDIES / EVENTS:  8/28  Admitted 8/31  MRI brain >>>  bilateral basal ganglial altered signal intensity  8/31  Intubated in MRI secondary to oversedation, extubated up on arrival to ICU.  Re intubated for "severe chorea and hypoxemic resp failure" 8/31  TTE >>> EF 45%, grade 2 DD 9/2    CT chest / abdomen / pelvis >>>  INTERVAL HISTORY: No acute changes overnigh  VITAL SIGNS: Temp:  [97.7 F (36.5 C)-98.4 F (36.9 C)] 98 F (36.7 C) (09/02 0830) Pulse Rate:  [59-81] 63 (09/02 1300) Resp:  [16-30] 17 (09/02 1300) BP: (95-181)/(21-99) 131/67 mmHg (09/02 1300) SpO2:  [98 %-100 %] 100 % (09/02 1300) FiO2 (%):  [40 %] 40 % (09/02 1309) Weight:  [81.5 kg (179 lb 10.8 oz)] 81.5 kg (179 lb 10.8 oz) (09/02 0227)  HEMODYNAMICS:   VENTILATOR SETTINGS: Vent Mode:  [-] PRVC FiO2 (%):  [40 %] 40 % Set Rate:  [16 bmp] 16 bmp Vt Set:  [480 mL] 480 mL PEEP:  [5 cmH20] 5 cmH20 Pressure Support:  [8 cmH20] 8 cmH20 Plateau Pressure:  [17 cmH20-18 cmH20] 18 cmH20  INTAKE / OUTPUT: Intake/Output     09/01 0701 - 09/02 0700 09/02 0701 - 09/03 0700   I.V. (mL/kg) 698.1 (8.6) 127.2 (1.6)   NG/GT 550 360   IV Piggyback 102 50   Total Intake(mL/kg) 1350.1 (16.6) 537.2 (6.6)   Urine (mL/kg/hr) 1935 (1) 300 (0.5)   Total Output 1935 300   Net -584.9 +237.2         PHYSICAL EXAMINATION: General: Resting in no distress Neuro: Well sedated, cough, gag diminished HEENT: OETT Cardiovascular: Regular, no murmurs Lungs: Bilateral air entry, no added sounds Abdomen:  Soft, bowel sounds  present Musculoskeletal: Anasarca Skin: Intact  LABS:  CBC  Recent Labs Lab 09/18/13 0234 08/22/2013 0232 09/21/13 0300  WBC 13.3* 11.2* 8.4  HGB 8.2* 8.9* 8.6*  HCT 26.0* 28.9* 28.0*  PLT 443* 574* 455*   Coag's  Recent Labs Lab 09/17/13 0550  APTT 24  INR 1.14   BMET  Recent Labs Lab 09/18/13 0540 09/10/2013 0232 09/21/13 0300  NA 144 144 145  K 4.3 4.2 4.0  CL 109 110 111  CO2 21 18* 21  BUN 26* 22 27*  CREATININE 1.55* 1.35* 1.42*  GLUCOSE 138* 135* 162*   Electrolytes  Recent Labs Lab 09/17/13 0235 09/17/13 0550  09/18/13 0540 08/22/2013 0232 09/21/13 0300  CALCIUM 8.9 8.0*  < > 8.8 8.6 8.8  MG 2.2 2.1  --   --   --   --   PHOS 4.3 3.9  --   --   --   --   < > = values in this interval not displayed. Sepsis Markers  Recent Labs Lab 09/17/13 0235 09/17/13 1300 09/18/13 0540  LATICACIDVEN 2.2 1.8 1.7   ABG  Recent Labs Lab 08/21/2013 2328 09/02/2013 2211  PHART 7.370 7.339*  PCO2ART 35.6 32.0*  PO2ART 78.0* 287.0*   Liver Enzymes  Recent Labs Lab 09/05/2013 1930 09/17/13 0235 08/26/2013 0232  AST 144* 121* 45*  ALT  71* 69* 66*  ALKPHOS 168* 164* 271*  BILITOT 0.4 0.4 0.3  ALBUMIN 2.4* 2.4* 2.2*   Cardiac Enzymes  Recent Labs Lab 09/03/2013 1930 09/03/2013 2025 09/17/13 0235  TROPONINI <0.30  --  <0.30  PROBNP  --  4061.0*  --    Glucose  Recent Labs Lab 09/20/13 1517 09/20/13 1940 09/21/13 0010 09/21/13 0412 09/21/13 0807 09/21/13 1300  GLUCAP 203* 358* 99 257* 190* 291*   IMAGING:   Dg Chest Port 1 View  09/20/2013   CLINICAL DATA:  Central line placement  EXAM: PORTABLE CHEST - 1 VIEW  COMPARISON:  08/30/2013  FINDINGS: There is a new left IJ central line, tip at the mid SVC. The endotracheal tube ends in the mid thoracic trachea, between the clavicular heads and carina. Gastric suction tube enters the stomach.  There is no complicating pneumothorax. Haziness of the lower chest consistent with atelectasis and effusions.  No discernible change in heart size or mediastinal contours, although there is marked distortion from rightward rotation.  IMPRESSION: 1. New left IJ catheter, tip at the SVC. No complicating pneumothorax. 2. Endotracheal and orogastric tubes remain in good position. 3. Worsening basilar atelectasis with pleural effusions. Cannot exclude superimposed pneumonia.   Electronically Signed   By: Jorje Guild M.D.   On: 09/20/2013 12:48   ASSESSMENT / PLAN:  PULMONARY A:  Acute respiratory failure, intubated for extreme agitation 8/31 ( intubated earlier same day for procedural sedation and then extubated ) P: Goal pH>7.30, SpO2>92 Continuous mechanical support VAP bundle Daily SBT, neurological status is limiting extubation Trend ABG/CXR  CARDIOVASCULAR A:  Chronic systolic / diastolic congestive heart failure ( EF 45%, grade 2 DD 8/31 ) HTN P:  MAP goal > 65 mmHg Hydralazine PRN  RENAL A:  AKI, improving Rhabdomyolysis, improving Fluid overload P:   Trend BMP Lasix increase to 40 mg q12h  GASTROINTESTINAL A:   Abnormal LFTs, improving Nutrition GIPx P:   NPO Protonix TF per Nutritionist  HEMATOLOGIC A:   Anemia VTE Px P:  Trend CBC Heparin [place]  INFECTIOUS A:   No overt infection Urine cx neg 8/28 P:   Follow blood / cx  ENDOCRINE A:   Diabetes  Hyperglycemia, likely steroid induced P:   SSI Lantus increase to 15  NEUROLOGIC A:   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  CRITICAL CARE:  The patient is critically ill with  multiple organ systems failure and requires high complexity decision making for assessment and support, frequent evaluation and titration of therapies, application of advanced monitoring technologies and extensive interpretation of multiple databases. Critical Care Time devoted to patient care services described in this note is 35 minutes.   Doree Fudge, MD Pulmonary and [place] Pager: [phone]  09/21/2013, 2:22 PM

## 2013-09-21 NOTE — Plan of Care (Signed)
Problem: Phase I Progression Outcomes Goal: VTE prophylaxis Outcome: Completed/Met Date Met:  09/21/13 Heparin SQ, SCD's

## 2013-09-21 NOTE — Telephone Encounter (Signed)
Pt no showed 09/09/2013 np appt w/ Dr. Carles Collet. Pt was referred by the ER.   Melissa Noon - please send pt a no show letter / Gayleen Orem.

## 2013-09-22 ENCOUNTER — Inpatient Hospital Stay (HOSPITAL_COMMUNITY): Payer: BC Managed Care – PPO

## 2013-09-22 ENCOUNTER — Encounter: Payer: Self-pay | Admitting: *Deleted

## 2013-09-22 LAB — CBC
HCT: 27.2 % — ABNORMAL LOW (ref 36.0–46.0)
HEMOGLOBIN: 8.4 g/dL — AB (ref 12.0–15.0)
MCH: 27.6 pg (ref 26.0–34.0)
MCHC: 30.9 g/dL (ref 30.0–36.0)
MCV: 89.5 fL (ref 78.0–100.0)
Platelets: 510 10*3/uL — ABNORMAL HIGH (ref 150–400)
RBC: 3.04 MIL/uL — AB (ref 3.87–5.11)
RDW: 15.8 % — ABNORMAL HIGH (ref 11.5–15.5)
WBC: 15.4 10*3/uL — AB (ref 4.0–10.5)

## 2013-09-22 LAB — GLUCOSE, CAPILLARY
GLUCOSE-CAPILLARY: 210 mg/dL — AB (ref 70–99)
Glucose-Capillary: 154 mg/dL — ABNORMAL HIGH (ref 70–99)
Glucose-Capillary: 162 mg/dL — ABNORMAL HIGH (ref 70–99)
Glucose-Capillary: 224 mg/dL — ABNORMAL HIGH (ref 70–99)
Glucose-Capillary: 227 mg/dL — ABNORMAL HIGH (ref 70–99)
Glucose-Capillary: 51 mg/dL — ABNORMAL LOW (ref 70–99)
Glucose-Capillary: 81 mg/dL (ref 70–99)

## 2013-09-22 LAB — HEPATIC FUNCTION PANEL
ALBUMIN: 2 g/dL — AB (ref 3.5–5.2)
ALT: 31 U/L (ref 0–35)
AST: 11 U/L (ref 0–37)
Alkaline Phosphatase: 187 U/L — ABNORMAL HIGH (ref 39–117)
Bilirubin, Direct: <0.2 mg/dL (ref 0.0–0.3)
TOTAL PROTEIN: 5.9 g/dL — AB (ref 6.0–8.3)
Total Bilirubin: <0.2 mg/dL — ABNORMAL LOW (ref 0.3–1.2)

## 2013-09-22 LAB — BASIC METABOLIC PANEL
Anion gap: 13 (ref 5–15)
BUN: 59 mg/dL — ABNORMAL HIGH (ref 6–23)
CHLORIDE: 111 meq/L (ref 96–112)
CO2: 22 meq/L (ref 19–32)
Calcium: 9 mg/dL (ref 8.4–10.5)
Creatinine, Ser: 1.77 mg/dL — ABNORMAL HIGH (ref 0.50–1.10)
GFR calc Af Amer: 34 mL/min — ABNORMAL LOW (ref >90)
GFR calc non Af Amer: 29 mL/min — ABNORMAL LOW (ref >90)
GLUCOSE: 58 mg/dL — AB (ref 70–99)
POTASSIUM: 3.7 meq/L (ref 3.7–5.3)
SODIUM: 146 meq/L (ref 137–147)

## 2013-09-22 MED ORDER — HALOPERIDOL 1 MG PO TABS
1.0000 mg | ORAL_TABLET | Freq: Two times a day (BID) | ORAL | Status: DC
  Administered 2013-09-22 (×2): 1 mg via ORAL
  Filled 2013-09-22 (×4): qty 1

## 2013-09-22 MED ORDER — MIDAZOLAM HCL 2 MG/2ML IJ SOLN
2.0000 mg | INTRAMUSCULAR | Status: DC | PRN
  Administered 2013-09-22 – 2013-09-23 (×6): 2 mg via INTRAVENOUS
  Filled 2013-09-22 (×5): qty 2

## 2013-09-22 MED ORDER — FENTANYL CITRATE 0.05 MG/ML IJ SOLN
100.0000 ug | INTRAMUSCULAR | Status: DC | PRN
  Administered 2013-09-22 – 2013-09-23 (×5): 100 ug via INTRAVENOUS
  Filled 2013-09-22 (×5): qty 2

## 2013-09-22 MED ORDER — DEXTROSE 50 % IV SOLN
INTRAVENOUS | Status: AC
  Filled 2013-09-22: qty 50

## 2013-09-22 MED ORDER — DEXTROSE 50 % IV SOLN: 25.0000 mL | Freq: Once | INTRAVENOUS | Status: AC | PRN

## 2013-09-22 NOTE — Progress Notes (Signed)
Inpatient Diabetes Program Recommendations  AACE/ADA: New Consensus Statement on Inpatient Glycemic Control (2013)  Target Ranges:  Prepandial:   less than 140 mg/dL      Peak postprandial:   less than 180 mg/dL (1-2 hours)      Critically ill patients:  140 - 180 mg/dL   Results for [name], [name] (MRN [id number]) as of 09/22/2013 08:52  Ref. Range 09/21/2013 04:12 09/21/2013 08:07 09/21/2013 13:00 09/21/2013 15:46 09/21/2013 19:07 09/22/2013 00:00 09/22/2013 04:16 09/22/2013 08:06  Glucose-Capillary Latest Range: 70-99 mg/dL 257 (H) 190 (H) 291 (H) 302 (H) 317 (H) 162 (H) 81 51 (L)   Diabetes history: DM2 Outpatient Diabetes medications: Lantus 8 units bid, Novolog  Current orders for Inpatient glycemic control: Novolog 0-9 units Q4H, Lantus 15 units QHS  Inpatient Diabetes Program Recommendations Insulin - Basal: Noted patient received Lantus 15 units last night and CBG 51 mg/dl at 8:06 am. Please consider decreasing Lantus to 10 units QHS. Insulin - Meal Coverage: May want to consider ordering Novolog 2 units Q4H for TF coverage if CBGs rebound and are consistently elevated > 180 mg/dl.  Thanks, Barnie Alderman, RN, MSN, CCRN Diabetes Coordinator Inpatient Diabetes Program [phone] (Team Pager) [phone] ([facility] office) [phone] [facility] office)

## 2013-09-22 NOTE — Progress Notes (Signed)
Patient ID: [name], female   DOB: December 19, 1950, 63 y.o.   MRN: [id number]   Pt with Rt arm CT contrast extravasation 09/21/13 80 cc into space slightly superior to Rt antecubital  Site is NT No redness; swelling minimal 2+ pulses distally Skin intact No bruising  Please let us know if nay further issues Sign off

## 2013-09-22 NOTE — Progress Notes (Signed)
No show letter sent for 08/21/2013

## 2013-09-22 NOTE — Progress Notes (Signed)
Subjective: Less agitated today.   Exam: Filed Vitals:   09/22/13 0500  BP: 130/62  Pulse: 51  Temp:   Resp: 16   Gen: In bed, intubated. On precedex MS: awakens to noxious stimuli. Follows some commands PN:PYYFR, eyes dysconjugate, but conjugate with arousal.  Motor: wiggles toes and closes eyes to command, but does not squeeze hands.  Sensory:responds to nox stim x 4.   Impression: 63 yo F with choreiform movements of subacute time course. Given the time course, autoimmune etiology has been considered and she has been started on steroids. Autoimmune workup in progress including search for a neoplasm. She seems to have some improvement as she is awake enough to follow simple commands without ballistic choreiform movements.   Recommendations: 1) would workup breast mass  2) anti-hu-yo,ri, crmp5 sent 3) continue solumedrol.   Roland Rack, MD [facility] [phone]  If 7pm- 7am, please page neurology on call as listed in [place].

## 2013-09-22 NOTE — Progress Notes (Signed)
Hypoglycemic Event  CBG: 51   Treatment: D50 IV 50 mL given at 0915  Symptoms: None  Follow-up CBG: Time:0930 CBG Result 210  Possible Reasons for Event: Inadequate meal intake, Change in activity and Unknown  Comments/MD notified: Dr. Oliver Pila notified    [name], [name]  Remember to initiate Hypoglycemia Order Set & complete

## 2013-09-22 NOTE — Progress Notes (Signed)
PULMONARY / CRITICAL CARE MEDICINE  Name: [name] MRN: [id number] DOB: March 14, 1950    ADMISSION DATE:  09/15/2013 CONSULTATION DATE:  09/07/2013  REFERRING MD :  Thereasa Solo   CHIEF COMPLAINT:  Concern for airway protection   INITIAL PRESENTATION: 63 yo with three week history of choreiform movements presents 8/28 with worsening involuntary movements, metabolic acidosis, rhabdomyolysis  and AKI.   STUDIES / EVENTS:  8/28  Admitted 8/31  MRI brain >>>  bilateral basal ganglial altered signal intensity  8/31  Intubated in MRI secondary to oversedation, extubated up on arrival to ICU.  Re intubated for "severe chorea and hypoxemic resp failure" 8/31  TTE >>> EF 45%, grade 2 DD 9/2    CT chest / abdomen / pelvis >>> 1.2 cm L breast mass, small bilateral effusions / atelectasis, atrophic R kidney with bilateral cysts  INTERVAL HISTORY: Uneventful nigh, CT revealed small R breast mass  VITAL SIGNS: Temp:  [95.4 F (35.2 C)-98.3 F (36.8 C)] 95.4 F (35.2 C) (09/03 0436) Pulse Rate:  [40-74] 51 (09/03 0500) Resp:  [10-30] 16 (09/03 0500) BP: (103-181)/(55-110) 130/62 mmHg (09/03 0500) SpO2:  [98 %-100 %] 100 % (09/03 0500) FiO2 (%):  [40 %] 40 % (09/03 0300) Weight:  [82.1 kg (181 lb)] 82.1 kg (181 lb) (09/03 0433)  HEMODYNAMICS:   VENTILATOR SETTINGS: Vent Mode:  [-] PRVC FiO2 (%):  [40 %] 40 % Set Rate:  [16 bmp] 16 bmp Vt Set:  [480 mL] 480 mL PEEP:  [5 cmH20] 5 cmH20 Pressure Support:  [8 cmH20] 8 cmH20 Plateau Pressure:  [18 cmH20-20 cmH20] 20 cmH20  INTAKE / OUTPUT: Intake/Output     09/02 0701 - 09/03 0700 09/03 0701 - 09/04 0700   I.V. (mL/kg) 512.6 (6.2)    NG/GT 785    IV Piggyback 50    Total Intake(mL/kg) 1347.6 (16.4)    Urine (mL/kg/hr) 1245 (0.6)    Total Output 1245     Net +102.6           PHYSICAL EXAMINATION: General: No distress Neuro: Wakes up o stimulation and follows some commands HEENT: OETT Cardiovascular: Regular, no murmurs Lungs:  Bilateral diminished air entry Abdomen:  Soft, bowel sounds present Musculoskeletal: Anasarca Skin: Intact  LABS:  CBC  Recent Labs Lab 09/15/2013 0232 09/21/13 0300 09/22/13 0430  WBC 11.2* 8.4 15.4*  HGB 8.9* 8.6* 8.4*  HCT 28.9* 28.0* 27.2*  PLT 574* 455* 510*   Coag's  Recent Labs Lab 09/17/13 0550  APTT 24  INR 1.14   BMET  Recent Labs Lab 09/04/2013 0232 09/21/13 0300 09/22/13 0430  NA 144 145 146  K 4.2 4.0 3.7  CL 110 111 111  CO2 18* 21 22  BUN 22 27* 59*  CREATININE 1.35* 1.42* 1.77*  GLUCOSE 135* 162* 58*   Electrolytes  Recent Labs Lab 09/17/13 0235 09/17/13 0550  08/28/2013 0232 09/21/13 0300 09/22/13 0430  CALCIUM 8.9 8.0*  < > 8.6 8.8 9.0  MG 2.2 2.1  --   --   --   --   PHOS 4.3 3.9  --   --   --   --   < > = values in this interval not displayed. Sepsis Markers  Recent Labs Lab 09/17/13 0235 09/17/13 1300 09/18/13 0540  LATICACIDVEN 2.2 1.8 1.7   ABG  Recent Labs Lab 09/12/2013 2328 09/02/2013 2211  PHART 7.370 7.339*  PCO2ART 35.6 32.0*  PO2ART 78.0* 287.0*   Liver Enzymes  Recent Labs  Lab 09/17/13 0235 09/18/2013 0232 09/22/13 0430  AST 121* 45* 11  ALT 69* 66* 31  ALKPHOS 164* 271* 187*  BILITOT 0.4 0.3 <0.2*  ALBUMIN 2.4* 2.2* 2.0*   Cardiac Enzymes  Recent Labs Lab 08/22/2013 1930 09/15/2013 2025 09/17/13 0235  TROPONINI <0.30  --  <0.30  PROBNP  --  4061.0*  --    Glucose  Recent Labs Lab 09/21/13 0807 09/21/13 1300 09/21/13 1546 09/21/13 1907 09/22/13 09/22/13 0416  GLUCAP 190* 291* 302* 317* 162* 81   IMAGING:   Ct Chest W Contrast  09/21/2013   ADDENDUM REPORT: 09/21/2013 14:27  ADDENDUM: 1.2 cm right breast mass.  Recommend mammographic correlation.   Electronically Signed   By: Marin Olp M.D.   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  EXAM: CT CHEST, ABDOMEN, AND PELVIS WITH CONTRAST  TECHNIQUE: Multidetector CT imaging of the chest, abdomen and  pelvis was performed following the standard protocol during bolus administration of intravenous contrast.  CONTRAST:  80 mL Omnipaque 300 IV.  COMPARISON:  Chest x-ray 09/20/2013.  FINDINGS: CT CHEST FINDINGS  Endotracheal tube has tip 1.4 cm above the carina. Left IJ central venous catheter has tip within the SVC. Nasogastric tube has tip over the distal stomach just right of midline. The lungs demonstrate small to moderate bilateral pleural effusions with associated lower lobe airspace disease likely atelectasis, although cannot exclude infection. There is mild dependent opacification over the posterior upper lobes likely atelectasis. Heart is normal in size. There is minimal calcification over the left main and anterior descending coronary arteries. There is no significant mediastinal, hilar or axillary adenopathy. There is moderate calcified plaque at the origin of the left subclavian artery with mild narrowing of the proximal left subclavian artery. Remaining mediastinal structures are unremarkable. There is an oval 1.2 cm mass over the upper outer right breast. There is elevation of the right hemidiaphragm. There are findings suggesting infiltration of the IV contrast over the soft tissues of the right upper arm.  CT ABDOMEN AND PELVIS FINDINGS  There is subtle periportal edema. Liver is otherwise unremarkable. Mild linear hypodensity over the superior aspect of the spleen. The pancreas, gallbladder and adrenal glands are within normal. Left kidney is normal in size with a sub cm hypodensity the lower pole likely a cyst but too small to characterize. Right kidney is somewhat atrophic measuring 7.2 cm in length with areas of cortical scarring. There are 2 right renal cyst present. There is no hydronephrosis or nephrolithiasis. Ureters are unremarkable. There is mild calcified plaque of the abdominal aorta and iliac arteries. The appendix is normal. There is subtle stranding of the left perinephric fat. There is  no abdominal/retroperitoneal adenopathy. No focal mass.  Pelvic images demonstrate be uterus, ovaries and rectum to be within normal. There is a small amount of free fluid present. A Foley catheter is present within the bladder. There is minimal subcutaneous edema over the abdomen/pelvis.  There are degenerative changes of the spine.  IMPRESSION: Small to moderate bilateral pleural effusions with associated lower lobe airspace disease likely atelectasis, although cannot exclude infection. Dependent opacification of the posterior upper lobes likely atelectasis.  No acute findings in the abdomen/pelvis.  Atrophic right kidney with evidence of bilateral renal cysts.  Mild nonspecific free fluid within the pelvis.  Subtle periportal edema which may be due to hypoproteinemia versus over hydration.  Linear hypodensity over the superior aspect the spleen which may be partially  artifactual. Recommend clinical correlation as ultrasound may be helpful for further evaluation.  Mild atherosclerotic coronary artery disease.  Tubes and lines as described.  Electronically Signed: By: Marin Olp M.D. On: 09/21/2013 13:14   Ct Abdomen Pelvis W Contrast  09/21/2013   ADDENDUM REPORT: 09/21/2013 14:27  ADDENDUM: 1.2 cm right breast mass.  Recommend mammographic correlation.   Electronically Signed   By: Marin Olp M.D.   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  EXAM: CT CHEST, ABDOMEN, AND PELVIS WITH CONTRAST  TECHNIQUE: Multidetector CT imaging of the chest, abdomen and pelvis was performed following the standard protocol during bolus administration of intravenous contrast.  CONTRAST:  80 mL Omnipaque 300 IV.  COMPARISON:  Chest x-ray 09/20/2013.  FINDINGS: CT CHEST FINDINGS  Endotracheal tube has tip 1.4 cm above the carina. Left IJ central venous catheter has tip within the SVC. Nasogastric tube has tip over the distal stomach just right of midline. The lungs demonstrate small  to moderate bilateral pleural effusions with associated lower lobe airspace disease likely atelectasis, although cannot exclude infection. There is mild dependent opacification over the posterior upper lobes likely atelectasis. Heart is normal in size. There is minimal calcification over the left main and anterior descending coronary arteries. There is no significant mediastinal, hilar or axillary adenopathy. There is moderate calcified plaque at the origin of the left subclavian artery with mild narrowing of the proximal left subclavian artery. Remaining mediastinal structures are unremarkable. There is an oval 1.2 cm mass over the upper outer right breast. There is elevation of the right hemidiaphragm. There are findings suggesting infiltration of the IV contrast over the soft tissues of the right upper arm.  CT ABDOMEN AND PELVIS FINDINGS  There is subtle periportal edema. Liver is otherwise unremarkable. Mild linear hypodensity over the superior aspect of the spleen. The pancreas, gallbladder and adrenal glands are within normal. Left kidney is normal in size with a sub cm hypodensity the lower pole likely a cyst but too small to characterize. Right kidney is somewhat atrophic measuring 7.2 cm in length with areas of cortical scarring. There are 2 right renal cyst present. There is no hydronephrosis or nephrolithiasis. Ureters are unremarkable. There is mild calcified plaque of the abdominal aorta and iliac arteries. The appendix is normal. There is subtle stranding of the left perinephric fat. There is no abdominal/retroperitoneal adenopathy. No focal mass.  Pelvic images demonstrate be uterus, ovaries and rectum to be within normal. There is a small amount of free fluid present. A Foley catheter is present within the bladder. There is minimal subcutaneous edema over the abdomen/pelvis.  There are degenerative changes of the spine.  IMPRESSION: Small to moderate bilateral pleural effusions with associated lower  lobe airspace disease likely atelectasis, although cannot exclude infection. Dependent opacification of the posterior upper lobes likely atelectasis.  No acute findings in the abdomen/pelvis.  Atrophic right kidney with evidence of bilateral renal cysts.  Mild nonspecific free fluid within the pelvis.  Subtle periportal edema which may be due to hypoproteinemia versus over hydration.  Linear hypodensity over the superior aspect the spleen which may be partially artifactual. Recommend clinical correlation as ultrasound may be helpful for further evaluation.  Mild atherosclerotic coronary artery disease.  Tubes and lines as described.  Electronically Signed: By: Marin Olp M.D. On: 09/21/2013 13:14   ASSESSMENT / PLAN:  PULMONARY A:  Acute respiratory failure, intubated for extreme agitation 8/31 ( intubated earlier  same day for procedural sedation and then extubated ) P: Goal pH>7.30, SpO2>92 Continuous mechanical support VAP bundle Daily SBT, neurological status is limiting extubation May need tracheostomy if no significant improvement by next week Trend ABG/CXR  CARDIOVASCULAR A:  Chronic systolic / diastolic congestive heart failure ( EF 45%, grade 2 DD 8/31 ) HTN P:  MAP goal > 65 mmHg Hydralazine PRN  RENAL A:  AKI P:   Trend BMP D/c Lasix  GASTROINTESTINAL A:   Abnormal LFTs, improving Nutrition GIPx P:   NPO Protonix TF per Nutritionist  HEMATOLOGIC A:   Anemia VTE Px P:  Trend CBC Heparin [place]  INFECTIOUS A:   No overt infection but increasing WBC Urine cx neg 8/28 P:   Follow blood / cx  ENDOCRINE A:   Diabetes Hyperglycemia, likely steroid induced P:   SSI Lantus 15  NEUROLOGIC A:   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  CRITICAL CARE:  The patient is critically ill with multiple organ systems failure and requires high complexity decision making for assessment and support, frequent evaluation and titration of therapies, application of advanced monitoring technologies and extensive interpretation of multiple databases. Critical Care Time devoted to patient care services described in this note is 35 minutes.   Doree Fudge, MD Pulmonary and [facility] Pager: [phone]  09/22/2013, 7:42 AM

## 2013-09-22 NOTE — Progress Notes (Signed)
[facility] informed of suspicious right breast mast. 1723 Critical Care Medicine, Dr. Nelda Marseille informed of new information.

## 2013-09-23 ENCOUNTER — Inpatient Hospital Stay (HOSPITAL_COMMUNITY): Payer: BC Managed Care – PPO

## 2013-09-23 DIAGNOSIS — R259 Unspecified abnormal involuntary movements: Secondary | ICD-10-CM

## 2013-09-23 LAB — CULTURE, BLOOD (ROUTINE X 2)
CULTURE: NO GROWTH
Culture: NO GROWTH

## 2013-09-23 LAB — CBC
HCT: 29 % — ABNORMAL LOW (ref 36.0–46.0)
Hemoglobin: 8.9 g/dL — ABNORMAL LOW (ref 12.0–15.0)
MCH: 27.6 pg (ref 26.0–34.0)
MCHC: 30.7 g/dL (ref 30.0–36.0)
MCV: 89.8 fL (ref 78.0–100.0)
Platelets: 526 10*3/uL — ABNORMAL HIGH (ref 150–400)
RBC: 3.23 MIL/uL — ABNORMAL LOW (ref 3.87–5.11)
RDW: 16.1 % — AB (ref 11.5–15.5)
WBC: 11.6 10*3/uL — ABNORMAL HIGH (ref 4.0–10.5)

## 2013-09-23 LAB — BASIC METABOLIC PANEL
Anion gap: 15 (ref 5–15)
BUN: 79 mg/dL — ABNORMAL HIGH (ref 6–23)
CALCIUM: 8.7 mg/dL (ref 8.4–10.5)
CO2: 18 mEq/L — ABNORMAL LOW (ref 19–32)
CREATININE: 2.03 mg/dL — AB (ref 0.50–1.10)
Chloride: 109 mEq/L (ref 96–112)
GFR, EST AFRICAN AMERICAN: 29 mL/min — AB (ref >90)
GFR, EST NON AFRICAN AMERICAN: 25 mL/min — AB (ref >90)
GLUCOSE: 305 mg/dL — AB (ref 70–99)
POTASSIUM: 4 meq/L (ref 3.7–5.3)
Sodium: 142 mEq/L (ref 137–147)

## 2013-09-23 LAB — GLUCOSE, CAPILLARY
GLUCOSE-CAPILLARY: 302 mg/dL — AB (ref 70–99)
GLUCOSE-CAPILLARY: 237 mg/dL — AB (ref 70–99)
GLUCOSE-CAPILLARY: 212 mg/dL — AB (ref 70–99)
GLUCOSE-CAPILLARY: 186 mg/dL — AB (ref 70–99)
GLUCOSE-CAPILLARY: 243 mg/dL — AB (ref 70–99)
Glucose-Capillary: 293 mg/dL — ABNORMAL HIGH (ref 70–99)

## 2013-09-23 LAB — ANTI-DNASE B ANTIBODY: Anti-DNAse-B: 137 U/mL (ref <301)

## 2013-09-23 LAB — VITAMIN B1

## 2013-09-23 MED ORDER — WHITE PETROLATUM GEL
Status: AC
  Administered 2013-09-23: 11:00:00
  Filled 2013-09-23: qty 5

## 2013-09-23 MED ORDER — CLONAZEPAM 0.5 MG PO TABS
1.0000 mg | ORAL_TABLET | Freq: Three times a day (TID) | ORAL | Status: DC
  Administered 2013-09-23 – 2013-09-26 (×8): 1 mg via ORAL
  Filled 2013-09-23 (×9): qty 2

## 2013-09-23 MED ORDER — SODIUM CHLORIDE 0.9 % IV SOLN
INTRAVENOUS | Status: DC
  Administered 2013-09-23 – 2013-09-24 (×2): via INTRAVENOUS

## 2013-09-23 MED ORDER — HALOPERIDOL 1 MG PO TABS
1.0000 mg | ORAL_TABLET | Freq: Two times a day (BID) | ORAL | Status: DC
  Administered 2013-09-23: 1 mg via ORAL
  Filled 2013-09-23 (×3): qty 1

## 2013-09-23 MED ORDER — GLUCERNA 1.2 CAL PO LIQD
1000.0000 mL | ORAL | Status: DC
  Administered 2013-09-23 – 2013-09-27 (×4): 1000 mL
  Filled 2013-09-23 (×9): qty 1000

## 2013-09-23 MED ORDER — INSULIN GLARGINE 100 UNIT/ML ~~LOC~~ SOLN
25.0000 [IU] | Freq: Every day | SUBCUTANEOUS | Status: DC
  Administered 2013-09-23: 25 [IU] via SUBCUTANEOUS
  Filled 2013-09-23 (×2): qty 0.25

## 2013-09-23 MED ORDER — MIDAZOLAM HCL 2 MG/2ML IJ SOLN
0.5000 mg | INTRAMUSCULAR | Status: DC | PRN
  Administered 2013-09-23 – 2013-09-27 (×5): 0.5 mg via INTRAVENOUS
  Filled 2013-09-23 (×5): qty 2

## 2013-09-23 MED ORDER — CLONAZEPAM 0.5 MG PO TABS
2.0000 mg | ORAL_TABLET | Freq: Three times a day (TID) | ORAL | Status: DC
  Administered 2013-09-23: 2 mg via ORAL
  Filled 2013-09-23: qty 4

## 2013-09-23 MED ORDER — SODIUM CHLORIDE 0.9 % IV BOLUS (SEPSIS)
500.0000 mL | Freq: Once | INTRAVENOUS | Status: AC
Start: 2013-09-23 — End: 2013-09-23
  Administered 2013-09-23: 500 mL via INTRAVENOUS

## 2013-09-23 NOTE — Progress Notes (Addendum)
Subjective: Contineus to have abnormal movements. She received sedative just prior to my exam.   Exam: Filed Vitals:   09/23/13 0600  BP: 151/68  Pulse: 58  Temp:   Resp: 16   Gen: In bed, intubated. On precedex MS: awakens to noxious stimuli. Does not clearly follow comands this am.  OV:ZCHYI, eyes dysconjugate, but conjugate with arousal. Does not fixate or track Motor: wiggles toes and closes eyes to command, but does not squeeze hands.  Sensory:responds to nox stim x 4.   Labs returned: RPR - wnl ANA - normal ACE - normal TSH- normal HIV - non-reactive Cerruloplasmin - noral Lyme negative Cardiolipin ab - negative Cortisol - negative Carboxyhemoglobin - nml PTH - high at 102 with normal calcium.   Labs pending - HD genotype(unlikely) Anti-DNAse B Anti-hu, yo, ri Anti-crmp5   Impression: 63 yo F with choreiform movements of subacute time course. Given the time course, autoimmune etiology has been considered and she has been started on steroids. Autoimmune workup in progress including search for a neoplasm. She seems to have some improvement as she is awake enough to follow simple commands without ballistic choreiform movements.   Unsure of significance of high PTH with normal calcium. Primary hyperparathyroidism has been associated with choreiform movements but with normal calcium this would seem more consistent with secondary hyperparathyroidism. Will discuss with primary team.   Recommendations: 1) would continue workup breast mass  2) anti-hu-yo,ri, crmp5 sent 3) continue solumedrol.  4) continue haldol 1mg  BID.   Roland Rack, MD [facility] [phone]  If 7pm- 7am, please page neurology on call as listed in [place].

## 2013-09-23 NOTE — Progress Notes (Signed)
[facility] Progress Note Patient Name: [name] DOB: 1950-11-23 MRN: [id number]   Date of Service  09/23/2013  HPI/Events of Note  aggitation  eICU Interventions  Low dos eversed     Intervention Category Minor Interventions: Agitation / anxiety - evaluation and management  Raylene Miyamoto. 09/23/2013, 11:16 PM

## 2013-09-23 NOTE — Progress Notes (Signed)
PULMONARY / CRITICAL CARE MEDICINE  Name: [name] MRN: [id number] DOB: 01-14-1951    ADMISSION DATE:  09/06/2013 CONSULTATION DATE:  08/22/2013  REFERRING MD :  Thereasa Solo   CHIEF COMPLAINT:  Concern for airway protection   INITIAL PRESENTATION: 63 yo with three week history of choreiform movements presents 8/28 with worsening involuntary movements, metabolic acidosis, rhabdomyolysis  and AKI.   STUDIES / EVENTS:  8/28  Admitted 8/31  MRI brain >>>  bilateral basal ganglial altered signal intensity  8/31  Intubated in MRI secondary to oversedation, extubated up on arrival to ICU.  Re intubated for "severe chorea and hypoxemic resp failure" 8/31  TTE >>> EF 45%, grade 2 DD 9/2    CT chest / abdomen / pelvis >>> 1.2 cm L breast mass, small bilateral effusions / atelectasis, atrophic R kidney with bilateral cysts 9/3    [place] L breas >>> mass / needs further work up / bx  INTERVAL HISTORY: No acute events overnight, tolerating SBT 5/5  VITAL SIGNS: Temp:  [97.3 F (36.3 C)-98 F (36.7 C)] 97.3 F (36.3 C) (09/04 0756) Pulse Rate:  [54-75] 58 (09/04 0757) Resp:  [15-20] 20 (09/04 0757) BP: (105-158)/(57-112) 158/76 mmHg (09/04 0757) SpO2:  [99 %-100 %] 100 % (09/04 0600) FiO2 (%):  [40 %] 40 % (09/04 0757) Weight:  [83.4 kg (183 lb 13.8 oz)] 83.4 kg (183 lb 13.8 oz) (09/04 0412)  HEMODYNAMICS:   VENTILATOR SETTINGS: Vent Mode:  [-] PSV;CPAP FiO2 (%):  [40 %] 40 % Set Rate:  [16 bmp] 16 bmp Vt Set:  [480 mL] 480 mL PEEP:  [5 cmH20] 5 cmH20 Pressure Support:  [5 cmH20] 5 cmH20 Plateau Pressure:  [17 cmH20-20 cmH20] 20 cmH20  INTAKE / OUTPUT: Intake/Output     09/03 0701 - 09/04 0700 09/04 0701 - 09/05 0700   I.V. (mL/kg) 508.6 (6.1)    Other 120    NG/GT 479.2    IV Piggyback 208    Total Intake(mL/kg) 1315.8 (15.8)    Urine (mL/kg/hr) 500 (0.2)    Total Output 500     Net +815.8           PHYSICAL EXAMINATION: General: Tolerating CPAP 5 PS 5 with appropriate  Vt/Ve Neuro: Follows some commands HEENT: OETT Cardiovascular: Regular, no murmurs Lungs: Few rhonchi Abdomen:  Soft, bowel sounds present Musculoskeletal: Anasarca Skin: Intact  LABS:  CBC  Recent Labs Lab 09/21/13 0300 09/22/13 0430 09/23/13 0213  WBC 8.4 15.4* 11.6*  HGB 8.6* 8.4* 8.9*  HCT 28.0* 27.2* 29.0*  PLT 455* 510* 526*   Coag's  Recent Labs Lab 09/17/13 0550  APTT 24  INR 1.14   BMET  Recent Labs Lab 09/21/13 0300 09/22/13 0430 09/23/13 0213  NA 145 146 142  K 4.0 3.7 4.0  CL 111 111 109  CO2 21 22 18*  BUN 27* 59* 79*  CREATININE 1.42* 1.77* 2.03*  GLUCOSE 162* 58* 305*   Electrolytes  Recent Labs Lab 09/17/13 0235 09/17/13 0550  09/21/13 0300 09/22/13 0430 09/23/13 0213  CALCIUM 8.9 8.0*  < > 8.8 9.0 8.7  MG 2.2 2.1  --   --   --   --   PHOS 4.3 3.9  --   --   --   --   < > = values in this interval not displayed. Sepsis Markers  Recent Labs Lab 09/17/13 0235 09/17/13 1300 09/18/13 0540  LATICACIDVEN 2.2 1.8 1.7   ABG  Recent Labs Lab  08/31/2013 2328 08/26/2013 2211  PHART 7.370 7.339*  PCO2ART 35.6 32.0*  PO2ART 78.0* 287.0*   Liver Enzymes  Recent Labs Lab 09/17/13 0235 09/14/2013 0232 09/22/13 0430  AST 121* 45* 11  ALT 69* 66* 31  ALKPHOS 164* 271* 187*  BILITOT 0.4 0.3 <0.2*  ALBUMIN 2.4* 2.2* 2.0*   Cardiac Enzymes  Recent Labs Lab 08/24/2013 1930 08/23/2013 2025 09/17/13 0235  TROPONINI <0.30  --  <0.30  PROBNP  --  4061.0*  --    Glucose  Recent Labs Lab 09/22/13 1250 09/22/13 1541 09/22/13 1957 09/23/13 0018 09/23/13 0409 09/23/13 0755  GLUCAP 154* 224* 227* 293* 302* 237*   IMAGING:   [place] Unlisted Procedure Breast  09/22/2013   CLINICAL DATA:  Patient with breast mass on prior CT  EXAM: ULTRASOUND OF THE RIGHT BREAST  COMPARISON:  CT 09/21/2013  FINDINGS: Sonographer performed breast ultrasound demonstrates a 1.5 x 1.0 x 1.0 cm irregular taller than wide hypoechoic mass within the right  breast 12 o'clock position 3 cm from the nipple. This corresponds with the finding on prior CT.  IMPRESSION: Suspicious right breast mass.  RECOMMENDATION: Recommend dedicated mammographic and sonographic evaluation of the right breast/axilla to complete workup in the non acute setting.  After dedicated mammographic and sonographic evaluation, the suspicious right breast mass would need ultrasound-guided core needle biopsy.  I have discussed the findings and recommendations with the patient. Results were also provided in writing at the conclusion of the visit. If applicable, a reminder letter will be sent to the patient regarding the next appointment.  BI-RADS CATEGORY  0: Incomplete. Need additional imaging evaluation and/or prior mammograms for comparison.  These results will be called to the ordering clinician or representative by the Radiologist Assistant, and communication documented in the PACS or zVision Dashboard.   Electronically Signed   By: Lovey Newcomer M.D.   On: 09/22/2013 17:01   Dg Abd Portable 1v  09/22/2013   CLINICAL DATA:  Gastric tube placement  EXAM: PORTABLE ABDOMEN - 1 VIEW  COMPARISON:  08/22/2013  FINDINGS: NG tube in the stomach unchanged in position. Negative bowel gas pattern. Contrast in the colon from CT.  Left lower lobe atelectasis shows mild improvement.  IMPRESSION: NG tube remains in the stomach unchanged   Electronically Signed   By: Franchot Gallo M.D.   On: 09/22/2013 14:54   ASSESSMENT / PLAN:  PULMONARY A:  Acute respiratory failure, intubated for extreme agitation 8/31 ( intubated earlier same day for procedural sedation and then extubated ) P: Goal SpO2>92 Extubate Supplemental oxygen  CARDIOVASCULAR A:  Chronic systolic / diastolic congestive heart failure ( EF 45%, grade 2 DD 8/31 ) HTN P:  MAP goal > 65 mmHg ASA Hydralazine PRN  RENAL A:  AKI P:   Trend BMP NS 500 x 1 NS@100   GASTROINTESTINAL A:   Dysphagia GIPx is not indicated P:    NPO Place nasal Panda prior to extubation D/c Protonix TF per Nutritionist  HEMATOLOGIC / ONC A:   R breast mass Anemia VTE Px P:  Needs mammogram / bx Trend CBC Heparin Graham  INFECTIOUS A:   Leucocytosis, possibly secondary to systemic steroids, no overt infection Urine cx neg 8/28 P:   Follow blood / cx  ENDOCRINE A:   Diabetes Hyperglycemia, likely steroid induced P:   SSI Lantus increase to 25  NEUROLOGIC A:   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  CRITICAL CARE:  The patient is critically ill with multiple organ systems failure and requires high complexity decision making for assessment and support, frequent evaluation and titration of therapies, application of advanced monitoring technologies and extensive interpretation of multiple databases. Critical Care Time devoted to patient care services described in this note is 35 minutes.   Doree Fudge, MD Pulmonary and [place] Pager: [phone]  09/23/2013, 9:22 AM

## 2013-09-23 NOTE — Progress Notes (Signed)
NUTRITION FOLLOW UP  Intervention:    Utilize 24M PEPuP Protocol: Change TF to Glucerna 1.2 at goal rate of 60 ml/h (1440 ml per day) to provide 1728 kcals, 86 gm protein, 1159 ml free water daily.  Nutrition Dx:   Inadequate oral intake related to inability to eat as evidenced by NPO status  Goal:   Intake to meet >90% of estimated nutrition needs. Unmet.  Monitor:   Diet advancement, PO intake, labs, weight trend.  Assessment:   63 yo with three week history of choreiform movements presents 8/28 with worsening involuntary movements, metabolic acidosis, rhabdomyolysis and AKI.    Discussed patient in ICU rounds today. Patient with new breast mass needing further work-up. Patient was extubated this morning. NGT in place. Plans to continue TF for now.   Patient has NGT in place with tip of tube in the peripyloric region, possibly gastric antrum or duodenal bulb. Vital High Protein is infusing at 25 ml/hr with Prostat 30 ml QID. Tube feeding regimen currently providing 800 kcal, 113 gm protein, and 502 ml free water daily.    Height: Ht Readings from Last 1 Encounters:  09/17/13 5\' 6"  (1.676 m)    Weight Status:   Wt Readings from Last 1 Encounters:  09/23/13 183 lb 13.8 oz (83.4 kg)  09/20/13 177 lb 14.6 oz (80.7 kg)  Re-estimated needs:  Kcal: 1600-1800 Protein: 80-90 gm Fluid: 1.6-1.8 L  Skin: intact  Diet Order: NPO   Intake/Output Summary (Last 24 hours) at 09/23/13 1249 Last data filed at 09/23/13 1200  Gross per 24 hour  Intake 1452.28 ml  Output    500 ml  Net 952.28 ml    Last BM: 9/4   Labs:   Recent Labs Lab 09/09/2013 1930 09/17/13 0235 09/17/13 0550  09/21/13 0300 09/22/13 0430 09/23/13 0213  NA 140 141 141  < > 145 146 142  K 4.8 4.3 4.7  < > 4.0 3.7 4.0  CL 102 103 108  < > 111 111 109  CO2 13* 20 17*  < > 21 22 18*  BUN 34* 34* 33*  < > 27* 59* 79*  CREATININE 2.00* 1.83* 1.75*  < > 1.42* 1.77* 2.03*  CALCIUM 9.7 8.9 8.0*  < > 8.8 9.0  8.7  MG  --  2.2 2.1  --   --   --   --   PHOS  --  4.3 3.9  --   --   --   --   GLUCOSE 161* 154* 175*  < > 162* 58* 305*  < > = values in this interval not displayed.  CBG (last 3)   Recent Labs  09/23/13 0409 09/23/13 0755 09/23/13 1227  GLUCAP 302* 237* 212*    Scheduled Meds: . amantadine  100 mg Oral BID  . antiseptic oral rinse  7 mL Mouth Rinse QID  . aspirin EC  81 mg Oral Daily  . chlorhexidine  15 mL Mouth Rinse BID  . clonazePAM  2 mg Oral TID  . feeding supplement (PRO-STAT SUGAR FREE 64)  30 mL Per Tube QID  . feeding supplement (VITAL HIGH PROTEIN)  1,000 mL Per Tube Q24H  . haloperidol  1 mg Oral BID  . heparin  5,000 Units Subcutaneous 3 times per day  . insulin aspart  0-9 Units Subcutaneous 6 times per day  . insulin glargine  25 Units Subcutaneous QHS  . methylPREDNISolone (SOLU-MEDROL) injection  250 mg Intravenous Q12H  . tetrahydrozoline  2 drop Both Eyes BID    Continuous Infusions: . sodium chloride 100 mL/hr at 09/23/13 0928  . dexmedetomidine 1.2 mcg/kg/hr (09/23/13 0550)    Molli Barrows, RD, LDN, [place] Pager [phone] After Hours Pager [phone]

## 2013-09-23 NOTE — Progress Notes (Signed)
Inpatient Diabetes Program Recommendations  AACE/ADA: New Consensus Statement on Inpatient Glycemic Control (2013)  Target Ranges:  Prepandial:   less than 140 mg/dL      Peak postprandial:   less than 180 mg/dL (1-2 hours)      Critically ill patients:  140 - 180 mg/dL   Results for NATIKA, GEYER (MRN [id number]) as of 09/23/2013 09:22  Ref. Range 09/22/2013 08:06 09/22/2013 09:29 09/22/2013 12:50 09/22/2013 15:41 09/22/2013 19:57 09/23/2013 00:18 09/23/2013 04:09 09/23/2013 07:55  Glucose-Capillary Latest Range: 70-99 mg/dL 51 (L) 210 (H) 154 (H) 224 (H) 227 (H) 293 (H) 302 (H) 237 (H)    Current orders for Inpatient glycemic control: Lantus 15 units QHS, Novolog 0-9 units Q4H  Inpatient Diabetes Program Recommendations Correction (SSI): CBGs ranged from 51-227 mg/dl on 09/22/13 and has ranged from 237-302 mg/dl today so far. Patient continues to be ordered Solumedrol 250 mg Q12H which is contributing to elevated glucose. Please consider discontinuing current Glycemic Control order set and order ICU Glycemic Control order set so patient can be transitioned to IV insulin if needed to improve glycemic control. Insulin - Meal Coverage: If tube feeding is continued may want to consider ordering Novolog 3 units Q4H for TF coverage.  Thanks, Barnie Alderman, RN, MSN, CCRN Diabetes Coordinator Inpatient Diabetes Program [phone] (Team Pager) [phone] ([facility] office) [phone] [facility] office)

## 2013-09-23 NOTE — Procedures (Signed)
Extubation Procedure Note  Patient Details:   Name: [name] DOB: 1950-03-08 MRN: [id number]   Airway Documentation:     Evaluation  O2 sats: stable throughout Complications: No apparent complications Patient did tolerate procedure well. Bilateral Breath Sounds: Clear;Diminished Suctioning: Airway Yes  Pt extubated to 3 LPM nasal cannula. Pt was able to vocalize where she was. Pt had good air movement in neck, no stridor noted. BBS clear; diminished. Pt had small amounts of sputum. Pt vitals are stable SPO2 98%. RT will continue to monitor.    Denzil Mceachron M 09/23/2013, 10:28 AM

## 2013-09-24 LAB — CBC
HCT: 26.8 % — ABNORMAL LOW (ref 36.0–46.0)
HEMOGLOBIN: 8.3 g/dL — AB (ref 12.0–15.0)
MCH: 28.6 pg (ref 26.0–34.0)
MCHC: 31 g/dL (ref 30.0–36.0)
MCV: 92.4 fL (ref 78.0–100.0)
Platelets: 470 10*3/uL — ABNORMAL HIGH (ref 150–400)
RBC: 2.9 MIL/uL — AB (ref 3.87–5.11)
RDW: 16.1 % — ABNORMAL HIGH (ref 11.5–15.5)
WBC: 9.4 10*3/uL (ref 4.0–10.5)

## 2013-09-24 LAB — BASIC METABOLIC PANEL
Anion gap: 13 (ref 5–15)
BUN: 92 mg/dL — ABNORMAL HIGH (ref 6–23)
CO2: 20 meq/L (ref 19–32)
Calcium: 8.4 mg/dL (ref 8.4–10.5)
Chloride: 114 mEq/L — ABNORMAL HIGH (ref 96–112)
Creatinine, Ser: 2.3 mg/dL — ABNORMAL HIGH (ref 0.50–1.10)
GFR calc Af Amer: 25 mL/min — ABNORMAL LOW (ref >90)
GFR calc non Af Amer: 21 mL/min — ABNORMAL LOW (ref >90)
GLUCOSE: 208 mg/dL — AB (ref 70–99)
POTASSIUM: 4 meq/L (ref 3.7–5.3)
SODIUM: 147 meq/L (ref 137–147)

## 2013-09-24 LAB — GLUCOSE, CAPILLARY
GLUCOSE-CAPILLARY: 122 mg/dL — AB (ref 70–99)
GLUCOSE-CAPILLARY: 174 mg/dL — AB (ref 70–99)
GLUCOSE-CAPILLARY: 190 mg/dL — AB (ref 70–99)
GLUCOSE-CAPILLARY: 246 mg/dL — AB (ref 70–99)
Glucose-Capillary: 184 mg/dL — ABNORMAL HIGH (ref 70–99)
Glucose-Capillary: 136 mg/dL — ABNORMAL HIGH (ref 70–99)

## 2013-09-24 MED ORDER — HALOPERIDOL 5 MG PO TABS
5.0000 mg | ORAL_TABLET | Freq: Two times a day (BID) | ORAL | Status: DC
  Administered 2013-09-24 – 2013-09-25 (×4): 5 mg via ORAL
  Filled 2013-09-24 (×6): qty 1

## 2013-09-24 MED ORDER — FENTANYL CITRATE 0.05 MG/ML IJ SOLN
25.0000 ug | INTRAMUSCULAR | Status: DC | PRN
  Administered 2013-09-24 – 2013-09-26 (×26): 25 ug via INTRAVENOUS
  Filled 2013-09-24 (×17): qty 2

## 2013-09-24 MED ORDER — HALOPERIDOL 2 MG PO TABS
2.0000 mg | ORAL_TABLET | Freq: Two times a day (BID) | ORAL | Status: DC
  Filled 2013-09-24 (×2): qty 1

## 2013-09-24 MED ORDER — CETYLPYRIDINIUM CHLORIDE 0.05 % MT LIQD
7.0000 mL | Freq: Two times a day (BID) | OROMUCOSAL | Status: DC
  Administered 2013-09-25 – 2013-09-27 (×5): 7 mL via OROMUCOSAL

## 2013-09-24 MED ORDER — VITAMIN B-1 100 MG PO TABS
100.0000 mg | ORAL_TABLET | Freq: Every day | ORAL | Status: DC
  Administered 2013-09-24 – 2013-09-25 (×2): 100 mg via ORAL
  Filled 2013-09-24 (×3): qty 1

## 2013-09-24 MED ORDER — INSULIN GLARGINE 100 UNIT/ML ~~LOC~~ SOLN
35.0000 [IU] | Freq: Every day | SUBCUTANEOUS | Status: DC
  Administered 2013-09-24 – 2013-09-27 (×4): 35 [IU] via SUBCUTANEOUS
  Filled 2013-09-24 (×5): qty 0.35

## 2013-09-24 NOTE — Progress Notes (Signed)
MD contacted second time regarding patient safety/agitation/uncontrolled movements in bed. See eMAR for new orders. RN maintaining direct line of sight continuously throughout the night. Will continue to monitor.

## 2013-09-24 NOTE — Progress Notes (Addendum)
Subjective: Movements have been worse after reducing sedating meds pending extubation  Exam: Filed Vitals:   09/24/13 0500  BP:   Pulse: 63  Temp:   Resp: 25   Gen: In bed, writhing MS: awake Does not clearly follow comands this am.  WO:EHOZY, eyes dysconjugate, but conjugate with arousal. Does not fixate or track Motor: wiggles toes and closes eyes to command, but does not squeeze hands.  Sensory:responds to nox stim x 4.   Labs returned: RPR - wnl ANA - normal ACE - normal TSH- normal HIV - non-reactive Cerruloplasmin - noral Lyme negative Cardiolipin ab - negative Cortisol - negative Carboxyhemoglobin - nml PTH - high at 102 with normal calcium.   Labs pending - HD genotype(unlikely) Anti-DNAse B Anti-hu, yo, ri Anti-crmp5 SSA SSB  Impression: 63 yo F with choreiform movements of subacute time course. Given the time course, autoimmune etiology has been considered and she has been started on steroids. Autoimmune workup in progress including search for a neoplasm. She seems to have some improvement as she is awake enough to follow simple commands without ballistic choreiform movements.   Unsure of significance of high PTH with normal calcium. Primary hyperparathyroidism has been associated with choreiform movements but with normal calcium this would seem more consistent with secondary hyperparathyroidism.   Recommendations: 1) would continue workup breast mass  2) continue solumedrol for a total of 5 days.  4) Increase haldol to 5mg  BID  Roland Rack, MD [facility] [phone]  If 7pm- 7am, please page neurology on call as listed in [place].

## 2013-09-24 NOTE — Progress Notes (Signed)
PULMONARY / CRITICAL CARE MEDICINE  Name: [name] MRN: [id number] DOB: 01/05/1951    ADMISSION DATE:  08/28/2013 CONSULTATION DATE:  09/19/2013  REFERRING MD :  Thereasa Solo   CHIEF COMPLAINT:  Concern for airway protection   INITIAL PRESENTATION: 63 yo with three week history of choreiform movements presents 8/28 with worsening involuntary movements, metabolic acidosis, rhabdomyolysis  and AKI.   STUDIES / EVENTS:  8/28  Admitted 8/31  MRI brain >>>  bilateral basal ganglial altered signal intensity  8/31  Intubated in MRI secondary to oversedation, extubated up on arrival to ICU.  Re intubated for "severe chorea and hypoxemic resp failure" 8/31  TTE >>> EF 45%, grade 2 DD 9/2    CT chest / abdomen / pelvis >>> 1.2 cm L breast mass, small bilateral effusions / atelectasis, atrophic R kidney with bilateral cysts 9/3    [place] L breas >>> mass / needs further work up / bx 9/4    Extubated  INTERVAL HISTORY: Remains extubated overnight.  Low dose versed overnight for agitation.  VITAL SIGNS: Temp:  [96.4 F (35.8 C)-98.5 F (36.9 C)] 98.3 F (36.8 C) (09/05 0751) Pulse Rate:  [57-151] 63 (09/05 0500) Resp:  [16-39] 25 (09/05 0500) BP: (100-215)/(31-172) 177/137 mmHg (09/05 0400) SpO2:  [92 %-100 %] 92 % (09/05 0500) FiO2 (%):  [40 %] 40 % (09/04 0930) Weight:  [86.3 kg (190 lb 4.1 oz)] 86.3 kg (190 lb 4.1 oz) (09/05 0432)  HEMODYNAMICS:   VENTILATOR SETTINGS: Vent Mode:  [-] PSV;CPAP FiO2 (%):  [40 %] 40 % PEEP:  [5 cmH20] 5 cmH20 Pressure Support:  [5 cmH20] 5 cmH20  INTAKE / OUTPUT: Intake/Output     09/04 0701 - 09/05 0700 09/05 0701 - 09/06 0700   I.V. (mL/kg) 2584.2 (29.9)    Other     NG/GT 948.9    IV Piggyback 104    Total Intake(mL/kg) 3637.1 (42.1)    Urine (mL/kg/hr) 830 (0.4)    Stool 50 (0)    Total Output 880     Net +2757.1           PHYSICAL EXAMINATION: General: No distress Neuro: Abnormal movements noted but responds appropriately to  questions HEENT: PERRL Cardiovascular: Regular, no murmurs Lungs: Bilateral air entry Abdomen:  Soft, bowel sounds present Musculoskeletal: Trace edema Skin: Intact  LABS:  CBC  Recent Labs Lab 09/22/13 0430 09/23/13 0213 09/24/13 0424  WBC 15.4* 11.6* 9.4  HGB 8.4* 8.9* 8.3*  HCT 27.2* 29.0* 26.8*  PLT 510* 526* 470*   Coag's No results found for this basename: APTT, INR,  in the last 168 hours  BMET  Recent Labs Lab 09/22/13 0430 09/23/13 0213 09/24/13 0424  NA 146 142 147  K 3.7 4.0 4.0  CL 111 109 114*  CO2 22 18* 20  BUN 59* 79* 92*  CREATININE 1.77* 2.03* 2.30*  GLUCOSE 58* 305* 208*   Electrolytes  Recent Labs Lab 09/22/13 0430 09/23/13 0213 09/24/13 0424  CALCIUM 9.0 8.7 8.4   Sepsis Markers  Recent Labs Lab 09/17/13 1300 09/18/13 0540  LATICACIDVEN 1.8 1.7   ABG  Recent Labs Lab 09/06/2013 2211  PHART 7.339*  PCO2ART 32.0*  PO2ART 287.0*   Liver Enzymes  Recent Labs Lab 09/16/2013 0232 09/22/13 0430  AST 45* 11  ALT 66* 31  ALKPHOS 271* 187*  BILITOT 0.3 <0.2*  ALBUMIN 2.2* 2.0*   Cardiac Enzymes No results found for this basename: TROPONINI, PROBNP,  in the last  168 hours  Glucose  Recent Labs Lab 09/23/13 0755 09/23/13 1227 09/23/13 1549 09/23/13 2054 09/23/13 2349 09/24/13 0426  GLUCAP 237* 212* 186* 243* 246* 190*   IMAGING:   Dg Chest Port 1 View  09/23/2013   CLINICAL DATA:  Infiltrates.  EXAM: PORTABLE CHEST - 1 VIEW  COMPARISON:  CT chest with contrast 09/21/2013.  FINDINGS: Patient is intubated. The endotracheal tube terminates 3 cm above the carina. A left IJ line is stable. The side port of the NG tube is in the fundus the stomach.  Left lower lobe pneumonia is again noted. A left pleural effusion persists. Aeration is improving. The right lung is clear.  IMPRESSION: 1. Persistent left lower lobe pneumonia with improving aeration. 2. Left greater than right pleural effusion. 3. Support apparatus is stable.    Electronically Signed   By: Lawrence Santiago M.D.   On: 09/23/2013 07:32   Dg Abd Portable 1v  09/23/2013   CLINICAL DATA:  NG tube placement  EXAM: PORTABLE ABDOMEN - 1 VIEW  COMPARISON:  09/22/2013  FINDINGS: NG tube tip remains in the proximal to mid stomach. Feeding tube is in place. The tip is in the peripyloric region, possibly gastric antrum or duodenal bulb. Nonobstructive bowel gas pattern.  IMPRESSION: Feeding tube tip in the peripyloric region.   Electronically Signed   By: Rolm Baptise M.D.   On: 09/23/2013 10:29   ASSESSMENT / PLAN:  PULMONARY A:  Acute respiratory failure, intubated for extreme agitation 8/31 ( intubated earlier same day for procedural sedation and then extubated ), extubated 9/4 P: Goal SpO2>92 Supplemental oxygen  CARDIOVASCULAR A:  Chronic systolic / diastolic congestive heart failure ( EF 45%, grade 2 DD 8/31 ) HTN P:  MAP goal > 65 mmHg ASA Hydralazine PRN  RENAL A:  AKI / CKD? P:   Trend BMP NS@100   GASTROINTESTINAL A:   Dysphagia GIPx is not indicated P:   NPO TF per Nutritionist  HEMATOLOGIC / ONC A:   R breast mass ( on CT 9/2 and [place] 9/2 ) - may be a culprit for paraneoplastic syndrome responsible for neurological presentation Anemia VTE Px P:  Needs mammogram / bx, should discuss with IR on Monday ( do not think can wait for outpatient workup, needs answer this admission )  Trend CBC Heparin [place]  INFECTIOUS A:   Leucocytosis, possibly secondary to systemic steroids, no overt infection Urine cx neg 8/28 P:   Follow blood / cx  ENDOCRINE A:   Diabetes Hyperglycemia, likely steroid induced Elevated PTH with normal Ca? ( secondary due to CKD? 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  Doree Fudge, MD Pulmonary and [place] Pager: [phone]  09/24/2013, 7:56 AM

## 2013-09-25 LAB — GLUCOSE, CAPILLARY
GLUCOSE-CAPILLARY: 214 mg/dL — AB (ref 70–99)
GLUCOSE-CAPILLARY: 129 mg/dL — AB (ref 70–99)
Glucose-Capillary: 148 mg/dL — ABNORMAL HIGH (ref 70–99)
Glucose-Capillary: 229 mg/dL — ABNORMAL HIGH (ref 70–99)
Glucose-Capillary: 171 mg/dL — ABNORMAL HIGH (ref 70–99)
Glucose-Capillary: 136 mg/dL — ABNORMAL HIGH (ref 70–99)

## 2013-09-25 LAB — BASIC METABOLIC PANEL
Anion gap: 14 (ref 5–15)
BUN: 87 mg/dL — ABNORMAL HIGH (ref 6–23)
CALCIUM: 8.1 mg/dL — AB (ref 8.4–10.5)
CO2: 18 mEq/L — ABNORMAL LOW (ref 19–32)
Chloride: 114 mEq/L — ABNORMAL HIGH (ref 96–112)
Creatinine, Ser: 2.3 mg/dL — ABNORMAL HIGH (ref 0.50–1.10)
GFR calc Af Amer: 25 mL/min — ABNORMAL LOW (ref >90)
GFR, EST NON AFRICAN AMERICAN: 21 mL/min — AB (ref >90)
GLUCOSE: 230 mg/dL — AB (ref 70–99)
Potassium: 4.5 mEq/L (ref 3.7–5.3)
SODIUM: 146 meq/L (ref 137–147)

## 2013-09-25 LAB — CBC
HCT: 27.1 % — ABNORMAL LOW (ref 36.0–46.0)
HEMOGLOBIN: 8.4 g/dL — AB (ref 12.0–15.0)
MCH: 28.7 pg (ref 26.0–34.0)
MCHC: 31 g/dL (ref 30.0–36.0)
MCV: 92.5 fL (ref 78.0–100.0)
PLATELETS: 418 10*3/uL — AB (ref 150–400)
RBC: 2.93 MIL/uL — ABNORMAL LOW (ref 3.87–5.11)
RDW: 16.3 % — ABNORMAL HIGH (ref 11.5–15.5)
WBC: 8.6 10*3/uL (ref 4.0–10.5)

## 2013-09-25 MED ORDER — FENTANYL CITRATE 0.05 MG/ML IJ SOLN
INTRAMUSCULAR | Status: AC
  Filled 2013-09-25: qty 2

## 2013-09-25 MED ORDER — SODIUM CHLORIDE 0.45 % IV SOLN
INTRAVENOUS | Status: DC
  Administered 2013-09-25 – 2013-09-26 (×2): via INTRAVENOUS

## 2013-09-25 MED ORDER — THIAMINE HCL 100 MG/ML IJ SOLN
500.0000 mg | Freq: Three times a day (TID) | INTRAVENOUS | Status: AC
  Administered 2013-09-25 – 2013-09-27 (×9): 500 mg via INTRAVENOUS
  Filled 2013-09-25 (×9): qty 5

## 2013-09-25 NOTE — Progress Notes (Signed)
PULMONARY / CRITICAL CARE MEDICINE  Name: [name] MRN: [id number] DOB: Jul 08, 1950    ADMISSION DATE:  09/17/2013 CONSULTATION DATE:  09/15/2013  REFERRING MD :  Thereasa Solo   CHIEF COMPLAINT:  Concern for airway protection   INITIAL PRESENTATION: 63 yo with three week history of choreiform movements presents 8/28 with worsening involuntary movements, metabolic acidosis, rhabdomyolysis  and AKI.   STUDIES / EVENTS:  8/28  Admitted 8/31  MRI brain >>>  bilateral basal ganglial altered signal intensity  8/31  Intubated in MRI secondary to oversedation, extubated up on arrival to ICU.  Re intubated for "severe chorea and hypoxemic resp failure" 8/31  TTE >>> EF 45%, grade 2 DD 9/2    CT chest / abdomen / pelvis >>> 1.2 cm L breast mass, small bilateral effusions / atelectasis, atrophic R kidney with bilateral cysts 9/3    [place] L breas >>> mass / needs further work up / bx 9/4    Extubated 9/5    Updated family in detail about findings and care plan  INTERVAL HISTORY: No issues overnight  VITAL SIGNS: Temp:  [97.6 F (36.4 C)-98.6 F (37 C)] 97.9 F (36.6 C) (09/06 0409) Pulse Rate:  [33-87] 70 (09/06 0600) Resp:  [19-36] 26 (09/06 0600) BP: (81-141)/(48-109) 81/51 mmHg (09/06 0600) SpO2:  [78 %-100 %] 96 % (09/06 0600) Weight:  [87.4 kg (192 lb 10.9 oz)] 87.4 kg (192 lb 10.9 oz) (09/06 0600)  HEMODYNAMICS:   VENTILATOR SETTINGS:   INTAKE / OUTPUT: Intake/Output     09/05 0701 - 09/06 0700 09/06 0701 - 09/07 0700   I.V. (mL/kg) 2910.6 (33.3)    NG/GT 1320    IV Piggyback 102    Total Intake(mL/kg) 4332.6 (49.6)    Urine (mL/kg/hr) 865 (0.4)    Stool     Total Output 865     Net +3467.6           PHYSICAL EXAMINATION: General: Resting in no distress Neuro: When awaken, confused / agitated HEENT: PERRL Cardiovascular: Regular, no murmurs Lungs: Bilateral air entry Abdomen:  Soft, bowel sounds present Musculoskeletal: Trace edema Skin:  Intact  LABS:  CBC  Recent Labs Lab 09/23/13 0213 09/24/13 0424 09/25/13 0535  WBC 11.6* 9.4 8.6  HGB 8.9* 8.3* 8.4*  HCT 29.0* 26.8* 27.1*  PLT 526* 470* 418*   Coag's No results found for this basename: APTT, INR,  in the last 168 hours  BMET  Recent Labs Lab 09/23/13 0213 09/24/13 0424 09/25/13 0535  NA 142 147 146  K 4.0 4.0 4.5  CL 109 114* 114*  CO2 18* 20 18*  BUN 79* 92* 87*  CREATININE 2.03* 2.30* 2.30*  GLUCOSE 305* 208* 230*   Electrolytes  Recent Labs Lab 09/23/13 0213 09/24/13 0424 09/25/13 0535  CALCIUM 8.7 8.4 8.1*   Sepsis Markers No results found for this basename: LATICACIDVEN, PROCALCITON, O2SATVEN,  in the last 168 hours  ABG  Recent Labs Lab 09/05/2013 2211  PHART 7.339*  PCO2ART 32.0*  PO2ART 287.0*   Liver Enzymes  Recent Labs Lab 09/06/2013 0232 09/22/13 0430  AST 45* 11  ALT 66* 31  ALKPHOS 271* 187*  BILITOT 0.3 <0.2*  ALBUMIN 2.2* 2.0*   Cardiac Enzymes No results found for this basename: TROPONINI, PROBNP,  in the last 168 hours  Glucose  Recent Labs Lab 09/24/13 0708 09/24/13 1103 09/24/13 1512 09/24/13 1925 09/24/13 2349 09/25/13 0406  GLUCAP 184* 174* 136* 122* 148* 214*   IMAGING:  No results found.  ASSESSMENT / PLAN:  PULMONARY A:  Acute respiratory failure, intubated for extreme agitation 8/31 ( intubated earlier same day for procedural sedation and then extubated ), extubated 9/4 P: Goal SpO2>92 Supplemental oxygen  CARDIOVASCULAR A:  Chronic systolic / diastolic congestive heart failure ( EF 45%, grade 2 DD 8/31 ) HTN P:  MAP goal > 65 mmHg ASA Hydralazine PRN  RENAL A:  AKI / CKD? 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 ( secondary due to CKD? 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  Doree Fudge, MD Pulmonary and [place] Pager: [phone]  09/25/2013, 7:19 AM

## 2013-09-25 NOTE — Progress Notes (Addendum)
Subjective: Continues to have severe movements.   Exam: Filed Vitals:   09/25/13 0731  BP:   Pulse:   Temp: 97.5 F (36.4 C)  Resp:    Gen: In bed, writhing MS: awake, follows commands this morning.  BS:JGGEZ, eyes conjugate, fixates and tracks across midline in both directions.  Motor: High amplitude choreiform movements of all four  Sensory:responds to nox stim x 4.   Labs returned: RPR - wnl ANA - normal ACE - normal TSH- normal HIV - non-reactive Cerruloplasmin - noral Lyme negative Cardiolipin ab - negative Cortisol - negative Carboxyhemoglobin - nml PTH - high at 102 with normal calcium.   Labs pending - HD genotype(unlikely) Anti-DNAse B Anti-hu, yo, ri Anti-crmp5 SSA SSB  Impression: 63 yo F with choreiform movements of subacute time course. Given the time course, autoimmune etiology has been considered and she has been started on steroids. Autoimmune workup in progress including search for a neoplasm.   Unsure of significance of high PTH with normal calcium. Primary hyperparathyroidism has been associated with choreiform movements but with normal calcium this would seem more consistent with secondary hyperparathyroidism.   She has completed a 5 days course of solumedrol without significant response. At this time, with a suspicious mass, I would think that if this is neoplastic, then excision of this mass would be the next step in treatent.   Further immunotherapy I would think should consist of plasma exchange, but I would favor evaluation and treatment of possible neoplasm prior to pursuing this course.   Recommendations: 1) would continue workup breast mass  2) Increase haldol to 5mg  TID 3) continue clonazepam 1mg  TID.   Roland Rack, MD [facility] [phone]  If 7pm- 7am, please page neurology on call as listed in [place].   Also Thiamine level is < 7, which is lower limit of detection. Thiamine deficiency can cause chorea.  Will start high dose supplementation with 500mg  IV TID x 3 days.   Roland Rack, MD [facility] [phone]  If 7pm- 7am, please page neurology on call as listed in [place].

## 2013-09-25 NOTE — Progress Notes (Signed)
QTC results called to Dr. Leonel Ramsay. No orders at this time.

## 2013-09-25 NOTE — Evaluation (Signed)
Physical Therapy Evaluation Patient Details Name: KATTIA SELLEY MRN: [id number] DOB: March 13, 1950 Today's Date: 09/25/2013   History of Present Illness  63 yo with three week history of choreiform movements presents 8/28 with worsening involuntary movements, metabolic acidosis, rhabdomyolysis and AKI. MRI brain showing bilateral basal ganglia altered signal intensity; Intubated due to "severe chorea and hypoxemic resp failure"; CT ches revealing L breast mass, atelectases, atrophic R kidney with bil cysts (Breast mass needing further workup); Extubated 9/4; PT/OT ordered 9/4  Clinical Impression   Pt admitted with above. Pt currently with functional limitations due to the deficits listed below (see PT Problem List).  Pt will benefit from skilled PT to increase their independence and safety with mobility to allow discharge to the venue listed below.        Follow Up Recommendations CIR    Equipment Recommendations   (to be determined)    Recommendations for Other Services Rehab consult;Speech consult     Precautions / Restrictions Precautions Precautions: Fall      Mobility  Bed Mobility Overal bed mobility:  (Nursing had used lift equipment to help pt up )                Transfers     Transfers: Sit to/from Stand Sit to Stand: +2 physical assistance;Total assist         General transfer comment: Did not note much , if any, LE muscle activation as a WBing response to Center of mass being shifted over feet; difficulty with rise with bil knees blocked; only hal-stand acheived  Ambulation/Gait             General Gait Details: Unable  Stairs            Wheelchair Mobility    Modified Rankin (Stroke Patients Only)       Balance Overall balance assessment: Needs assistance Sitting-balance support: Feet supported Sitting balance-Leahy Scale: Poor Sitting balance - Comments: Sitting in recliner, noted able to pull to unsupported sit with mod assist;  Requires extremely close guard due to tendency to lose balance forward in unsupported sitting; Decr trunk stability in unsupported sitting                                     Pertinent Vitals/Pain Pain Assessment:  (Appears generally uncomfortable with sig choreiform movt) Unable to get a reliable BP due to UE movement    Home Living Family/patient expects to be discharged to:: Private residence (Pt unable to verbalize and spouse unavailable; was dc'd to private residence after most recent admission ) Living Arrangements: Spouse/significant other Available Help at Discharge: Family;Available PRN/intermittently Type of Home: House       Home Layout: Two level;Bed/bath upstairs Home Equipment: Cane - single point;Shower seat;Walker - 4 wheels      Prior Function Level of Independence:  (prior to recent admission of 09/10/13)         Comments: patient reports she stays upstairs all day and does not get out of bed when alone (prior to recent admission of 09/10/13)     Hand Dominance   Dominant Hand: Right    Extremity/Trunk Assessment   Upper Extremity Assessment: Defer to OT evaluation (sever choreiform movements)           Lower Extremity Assessment: Generalized weakness (significant choreiform movements)         Communication   Communication: Expressive difficulties (No  difficulty prior to recent admission of 09/10/13)  Cognition Arousal/Alertness: Awake/alert Behavior During Therapy: Restless (Significant choreiform movement) Overall Cognitive Status: Difficult to assess                      General Comments      Exercises        Assessment/Plan    PT Assessment Patient needs continued PT services  PT Diagnosis Generalized weakness;Difficulty walking (Choreiform movement disorder)   PT Problem List Decreased strength;Decreased range of motion;Decreased activity tolerance;Decreased balance;Decreased mobility;Decreased  coordination;Decreased cognition;Decreased knowledge of use of DME;Decreased safety awareness;Decreased knowledge of precautions;Impaired tone;Impaired sensation  PT Treatment Interventions DME instruction;Gait training;Functional mobility training;Therapeutic activities;Therapeutic exercise;Balance training;Neuromuscular re-education;Cognitive remediation;Patient/family education;Wheelchair mobility training   PT Goals (Current goals can be found in the Care Plan section) Acute Rehab PT Goals Patient Stated Goal: Unable to state, but able to answer yes/no questions to agree to working with PT PT Goal Formulation: Patient unable to participate in goal setting Time For Goal Achievement: 10/09/13 Potential to Achieve Goals: [employer] (and is largely dependent on medical course)    Frequency Min 3X/week   Barriers to discharge   Will need to find out exactly how much assist is available to pt at home; how much can her spouse assist her?    Co-evaluation               End of Session Equipment Utilized During Treatment: Gait belt Activity Tolerance: Patient limited by fatigue Patient left: in chair;with call bell/phone within reach;with nursing/sitter in room Nurse Communication: Mobility status;Precautions         Time: 6948-5462 PT Time Calculation (min): 20 min   Charges:   PT Evaluation $Initial PT Evaluation Tier I: 1 Procedure PT Treatments $Therapeutic Activity: 8-22 mins   PT G Codes:          Quin Hoop 09/25/2013, 11:15 AM  Roney Marion, PT  Acute Rehabilitation Services Pager [phone] Office [phone]

## 2013-09-26 ENCOUNTER — Inpatient Hospital Stay (HOSPITAL_COMMUNITY): Payer: BC Managed Care – PPO

## 2013-09-26 DIAGNOSIS — R092 Respiratory arrest: Secondary | ICD-10-CM

## 2013-09-26 LAB — BASIC METABOLIC PANEL
Anion gap: 9 (ref 5–15)
BUN: 80 mg/dL — ABNORMAL HIGH (ref 6–23)
CHLORIDE: 118 meq/L — AB (ref 96–112)
CO2: 20 meq/L (ref 19–32)
Calcium: 8.2 mg/dL — ABNORMAL LOW (ref 8.4–10.5)
Creatinine, Ser: 2.19 mg/dL — ABNORMAL HIGH (ref 0.50–1.10)
GFR calc Af Amer: 26 mL/min — ABNORMAL LOW (ref >90)
GFR calc non Af Amer: 23 mL/min — ABNORMAL LOW (ref >90)
GLUCOSE: 161 mg/dL — AB (ref 70–99)
POTASSIUM: 4.2 meq/L (ref 3.7–5.3)
Sodium: 147 mEq/L (ref 137–147)

## 2013-09-26 LAB — CBC
HEMATOCRIT: 27.2 % — AB (ref 36.0–46.0)
HEMOGLOBIN: 8.1 g/dL — AB (ref 12.0–15.0)
MCH: 27.6 pg (ref 26.0–34.0)
MCHC: 29.8 g/dL — ABNORMAL LOW (ref 30.0–36.0)
MCV: 92.5 fL (ref 78.0–100.0)
Platelets: 395 10*3/uL (ref 150–400)
RBC: 2.94 MIL/uL — ABNORMAL LOW (ref 3.87–5.11)
RDW: 16.9 % — ABNORMAL HIGH (ref 11.5–15.5)
WBC: 9.2 10*3/uL (ref 4.0–10.5)

## 2013-09-26 LAB — GLUCOSE, CAPILLARY
GLUCOSE-CAPILLARY: 129 mg/dL — AB (ref 70–99)
GLUCOSE-CAPILLARY: 144 mg/dL — AB (ref 70–99)
Glucose-Capillary: 125 mg/dL — ABNORMAL HIGH (ref 70–99)
Glucose-Capillary: 137 mg/dL — ABNORMAL HIGH (ref 70–99)
Glucose-Capillary: 122 mg/dL — ABNORMAL HIGH (ref 70–99)
Glucose-Capillary: 100 mg/dL — ABNORMAL HIGH (ref 70–99)

## 2013-09-26 MED ORDER — FUROSEMIDE 10 MG/ML IJ SOLN
40.0000 mg | Freq: Once | INTRAMUSCULAR | Status: AC
  Administered 2013-09-26: 40 mg via INTRAVENOUS
  Filled 2013-09-26: qty 4

## 2013-09-26 MED ORDER — FREE WATER
200.0000 mL | Freq: Three times a day (TID) | Status: DC
  Administered 2013-09-26 – 2013-09-28 (×7): 200 mL

## 2013-09-26 MED ORDER — ASPIRIN 81 MG PO CHEW
81.0000 mg | CHEWABLE_TABLET | Freq: Every day | ORAL | Status: DC
  Administered 2013-09-26 – 2013-10-05 (×10): 81 mg
  Filled 2013-09-26 (×10): qty 1

## 2013-09-26 MED ORDER — VITAMIN B-1 100 MG PO TABS: 100.0000 mg | ORAL_TABLET | Freq: Every day | ORAL | Status: DC

## 2013-09-26 MED ORDER — FENTANYL CITRATE 0.05 MG/ML IJ SOLN
25.0000 ug | INTRAMUSCULAR | Status: DC | PRN
  Administered 2013-09-26: 100 ug via INTRAVENOUS
  Administered 2013-09-26: 50 ug via INTRAVENOUS
  Administered 2013-09-26: 100 ug via INTRAVENOUS
  Administered 2013-09-26: 25 ug via INTRAVENOUS
  Administered 2013-09-26 (×2): 100 ug via INTRAVENOUS
  Administered 2013-09-26: 50 ug via INTRAVENOUS
  Administered 2013-09-26: 25 ug via INTRAVENOUS
  Administered 2013-09-26 – 2013-10-05 (×27): 100 ug via INTRAVENOUS
  Filled 2013-09-26 (×33): qty 2

## 2013-09-26 MED ORDER — FENTANYL CITRATE 0.05 MG/ML IJ SOLN
INTRAMUSCULAR | Status: AC
  Filled 2013-09-26: qty 2

## 2013-09-26 MED ORDER — CHLORHEXIDINE GLUCONATE 0.12 % MT SOLN
15.0000 mL | Freq: Two times a day (BID) | OROMUCOSAL | Status: DC
  Administered 2013-09-26 – 2013-10-05 (×18): 15 mL via OROMUCOSAL
  Filled 2013-09-26 (×18): qty 15

## 2013-09-26 MED ORDER — VITAMIN B-1 100 MG PO TABS
100.0000 mg | ORAL_TABLET | Freq: Every day | ORAL | Status: DC
  Administered 2013-09-28 – 2013-10-05 (×8): 100 mg
  Filled 2013-09-26 (×8): qty 1

## 2013-09-26 MED ORDER — ETOMIDATE 2 MG/ML IV SOLN
20.0000 mg | Freq: Once | INTRAVENOUS | Status: AC
  Administered 2013-09-26: 20 mg via INTRAVENOUS

## 2013-09-26 MED ORDER — CETYLPYRIDINIUM CHLORIDE 0.05 % MT LIQD
7.0000 mL | Freq: Four times a day (QID) | OROMUCOSAL | Status: DC
  Administered 2013-09-27 – 2013-10-05 (×35): 7 mL via OROMUCOSAL

## 2013-09-26 MED ORDER — FAMOTIDINE 40 MG/5ML PO SUSR
20.0000 mg | Freq: Every day | ORAL | Status: DC
  Administered 2013-09-26 – 2013-10-04 (×9): 20 mg
  Filled 2013-09-26 (×10): qty 2.5

## 2013-09-26 MED ORDER — ROCURONIUM BROMIDE 50 MG/5ML IV SOLN
50.0000 mg | Freq: Once | INTRAVENOUS | Status: AC
  Administered 2013-09-26: 50 mg via INTRAVENOUS

## 2013-09-26 MED ORDER — CLONAZEPAM 0.5 MG PO TABS
1.0000 mg | ORAL_TABLET | Freq: Three times a day (TID) | ORAL | Status: DC
Start: 2013-09-26 — End: 2013-10-05
  Administered 2013-09-26 – 2013-10-05 (×27): 1 mg
  Filled 2013-09-26 (×28): qty 2

## 2013-09-26 MED ORDER — HALOPERIDOL 5 MG PO TABS
5.0000 mg | ORAL_TABLET | Freq: Three times a day (TID) | ORAL | Status: DC
  Administered 2013-09-26 – 2013-09-28 (×8): 5 mg
  Filled 2013-09-26 (×9): qty 1

## 2013-09-26 MED ORDER — HALOPERIDOL 5 MG PO TABS
5.0000 mg | ORAL_TABLET | Freq: Three times a day (TID) | ORAL | Status: DC
  Filled 2013-09-26 (×3): qty 1

## 2013-09-26 MED ORDER — AMANTADINE HCL 100 MG PO CAPS
100.0000 mg | ORAL_CAPSULE | Freq: Two times a day (BID) | ORAL | Status: DC
  Administered 2013-09-26 – 2013-09-27 (×3): 100 mg
  Filled 2013-09-26 (×4): qty 1

## 2013-09-26 MED FILL — Medication: Qty: 1 | Status: AC

## 2013-09-26 NOTE — Progress Notes (Signed)
PULMONARY / CRITICAL CARE MEDICINE  Name: [name] MRN: [id number] DOB: 08-Mar-1950    ADMISSION DATE:  09/19/2013 CONSULTATION DATE:  09/14/2013  REFERRING MD :  Thereasa Solo   CHIEF COMPLAINT:  Concern for airway protection   INITIAL PRESENTATION: 63 yo with three week history of choreiform movements presents 8/28 with worsening involuntary movements, metabolic acidosis, rhabdomyolysis  and AKI.   STUDIES / EVENTS:  8/28  Admitted 8/31  MRI brain >>>  bilateral basal ganglial altered signal intensity  8/31  Intubated in MRI secondary to oversedation, extubated up on arrival to ICU.  Re intubated for "severe chorea and hypoxemic resp failure" 8/31  TTE >>> EF 45%, grade 2 DD 9/2    CT chest / abdomen / pelvis >>> 1.2 cm L breast mass, small bilateral effusions / atelectasis, atrophic R kidney with bilateral cysts 9/3    [place] L breas >>> mass / needs further work up / bx 9/4    Extubated 9/5    Updated family in detail about findings and care plan 9/07 Was conversant with RN, then sudden onset of central apnea and junctional bradycardia. HR responded to atropine. Intubated 9/07 CT head: No acute change  INTERVAL HISTORY:  Was conversant with RN, then sudden onset of central apnea and junctional bradycardia. HR responded to atropine. Intubated  VITAL SIGNS: Temp:  [97.5 F (36.4 C)-98.4 F (36.9 C)] 98 F (36.7 C) (09/07 1531) Pulse Rate:  [52-133] 57 (09/07 1558) Resp:  [16-36] 19 (09/07 1558) BP: (106-232)/(28-113) 119/60 mmHg (09/07 1300) SpO2:  [85 %-100 %] 100 % (09/07 1558) FiO2 (%):  [40 %-100 %] 40 % (09/07 1558) Weight:  [90 kg (198 lb 6.6 oz)] 90 kg (198 lb 6.6 oz) (09/07 0500)  HEMODYNAMICS:   VENTILATOR SETTINGS: Vent Mode:  [-] PRVC FiO2 (%):  [40 %-100 %] 40 % Set Rate:  [16 bmp] 16 bmp Vt Set:  [480 mL] 480 mL PEEP:  [5 cmH20] 5 cmH20 Plateau Pressure:  [19 cmH20-23 cmH20] 20 cmH20 INTAKE / OUTPUT: Intake/Output     09/06 0701 - 09/07 0700 09/07 0701 -  09/08 0700   I.V. (mL/kg) 2465.2 (27.4) 331.7 (3.7)   NG/GT 850 370   IV Piggyback 150 50   Total Intake(mL/kg) 3465.2 (38.5) 751.7 (8.4)   Urine (mL/kg/hr) 1515 (0.7) 250 (0.3)   Stool 100 (0)    Total Output 1615 250   Net +1850.2 +501.7         PHYSICAL EXAMINATION: General: Comatose post intubation Neuro: MAEs, no choreiform movements noted. Cognition cannot be tested HEENT: PERRL, corneals present, EOMI Cardiovascular: Regular, no murmurs Lungs: Clear anteriorly Abdomen:  Soft, bowel sounds present Ext: Trace edema, warm  LABS: I have reviewed all of today's lab results. Relevant abnormalities are discussed in the A/P section  CXR: New finding of edema pattern  ASSESSMENT / PLAN:  PULMONARY A:  Acute respiratory failure Pulm edema pattern on CXR Recurrent intubation, 8/31 for agitation, MRI brain >> 9/4       9/07 for apnea >>  P: Cont full vent support - settings established  Vent bundle Daily SBT if/when meets criteria Lasix X 1 9/07  CARDIOVASCULAR A:  Chronic combined CHF (EF 45%, grade 2 DD 8/31) HTN P:  MAP goal > 65 mmHg Cont ASA Hydralazine PRN to maintain SBP < 170 mmHg  RENAL A:  AKI - nonoliguric, improving. Baseline Cr unknown Mild hypernatremia Mild metabolic acidosis likely secondary to renal failure P:  Monitor BMET intermittently Monitor I/Os Correct electrolytes as indicated  GASTROINTESTINAL A:   Dysphagia P:   SUP: famotidine Cont TFs  HEMATOLOGIC / ONC A:   R breast mass (on CT 9/2 and [place] 9/2) Anemia P:  DVT px: SQ heparin Monitor CBC intermittently Transfuse per usual ICU guidelines Will pursue breast bx when able  INFECTIOUS A:   Leukocytosis, resolved P:   Following off abx  ENDOCRINE A:   DM 2 Elevated PTH with normal Ca? ( secondary due to CKD? 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  45 mins CCM time. Husband updated @ bedside. Discussed with Dr Trenton Founds, MD Pulmonary and [place] Pager: [phone]  09/26/2013, 5:07 PM

## 2013-09-26 NOTE — Progress Notes (Signed)
ET pulled back 2cm

## 2013-09-26 NOTE — Procedures (Signed)
History: 64 yo F with sudden change in conciousness earlier.   Sedation: precedex, fentanyl  Technique: This is a 17 channel routine scalp EEG performed at the bedside with bipolar and monopolar montages arranged in accordance to the international 10/20 system of electrode placement. One channel was dedicated to EKG recording.    Background: The background consists of her delta activity with superimposed intermittent sleep spindles. This pattern continues throughout the recording  Photic stimulation: Physiologic driving is not performed  EEG Abnormalities: 1) sleep EEG  Clinical Interpretation: This EEG is most consistent with a sedated EEG with only sleep recorded. There was no seizure or seizure predisposition recorded on this study.   Roland Rack, MD [facility] [phone]  If 7pm- 7am, please page neurology on call as listed in [place].

## 2013-09-26 NOTE — Progress Notes (Signed)
UR Completed.  Vergie Living [phone] 09/26/2013

## 2013-09-26 NOTE — Progress Notes (Signed)
OT Cancellation Note  Patient Details Name: [name] MRN: [id number] DOB: [birth year]   Cancelled Treatment:    Reason Eval/Treat Not Completed: Medical issues which prohibited therapy.  Pt is now reintubated.  Spoke with RN who recommended therapies sign off.  Will sign off at this time due to medical decline.  Pt will need re-order for PT/OT when medically stable.   Darlina Rumpf [place], OTR/L [phone]  09/26/2013, 11:15 AM

## 2013-09-26 NOTE — Progress Notes (Signed)
Routine adult EEG completed at bedside, results pending.

## 2013-09-26 NOTE — Procedures (Signed)
Oral Intubation Procedure Note   Procedure: Intubation Indications: Respiratory arrest Consent: Unable to obtain consent because of altered level of consciousness. Time Out: Verified patient identification, verified procedure, site/side was marked, verified correct patient position, special equipment/implants available, medications/allergies/relevent history reviewed, required imaging and test results available.   Pre-meds: Etomidate 20 mg IV  Neuromuscular blockade: Rocuronium 50 mg IV  Laryngoscope: #3 MAC  Visualization: cords fully visualized  ETT: 8.0 ETT passed on first attempt and secured @ 22 cm at upper gums  Findings:  Grade I visualization but airway is deep and anterior. If requires re-intubation, a #4 MAC would be preferred ETT position confirmed with EZCap and auscultation  Evaluation:  CXR is pending  Pt tolerated procedure well with transient desaturation   Merton Border, MD ; [facility] service Mobile [phone].  After 5:30 PM or weekends, call [phone]

## 2013-09-26 NOTE — Progress Notes (Signed)
Subjective: Continues to have abnormal movements.   Exam: Filed Vitals:   09/26/13 0700  BP:   Pulse: 56  Temp:   Resp: 23   Gen: In bed, writhing MS: awake, follows commands this morning.  RS:WNIOE, eyes conjugate, fixates and tracks across midline in both directions.  Motor: High amplitude choreiform movements of all four  Sensory:responds to nox stim x 4.   Labs returned: RPR - wnl ANA - normal ACE - normal TSH- normal HIV - non-reactive Cerruloplasmin - noral Lyme negative Cardiolipin ab - negative Cortisol - negative Carboxyhemoglobin - nml PTH - high at 102 with normal calcium.  Anti-DNAse B - negative  Labs pending - HD genotype(unlikely) Anti-hu, yo, ri Anti-crmp5 SSA SSB  ABNORMAL LABS: Thiamine < 7.  Impression: 63 yo F with choreiform movements of subacute time course. Given the time course, autoimmune etiology has been considered and she was started on steroids. She has completed a 5 days course of solumedrol without significant response. At this time, with a suspicious mass, I would think that if this is neoplastic, then excision of this mass would be the next step in treatent.   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   Further immunotherapy I would think should consist of plasma exchange, but I would favor evaluation and treatment of possible neoplasm prior to pursuing this course.   Also, in the interim, will continue treating with thiamine as thiamine deficiency has been linked with chorea in case reports.   Recommendations: 1) would continue to workup breast mass  2) Increase haldol to 5mg  TID 3) continue clonazepam 1mg  TID.  4) thiamine 500mg  IV TID.   Roland Rack, MD [facility] [phone]  If 7pm- 7am, please page neurology on call as listed in [place].

## 2013-09-26 NOTE — Progress Notes (Signed)
Thank you for consult on Ms. [name]. She was reintubated this am for respiratory failure. Work up ongoing for paraneoplastic syndrome v/s autoimmune etiology. Will follow along at a distance for now.

## 2013-09-26 NOTE — Progress Notes (Signed)
PT Cancellation Note  Patient Details Name: MANDE AUVIL MRN: [id number] DOB: 1950-05-27   Cancelled Treatment:    Reason Eval/Treat Not Completed: Medical issues which prohibited therapy (Pt intubated this am.  Will sign off per nurse.  Please reorder as appropriate.)   INGOLD,Gagandeep Kossman 09/26/2013, 12:14 PM [place] [facility] [phone] (pager)

## 2013-09-27 ENCOUNTER — Inpatient Hospital Stay (HOSPITAL_COMMUNITY): Payer: BC Managed Care – PPO

## 2013-09-27 ENCOUNTER — Encounter (HOSPITAL_COMMUNITY): Payer: Self-pay | Admitting: General Surgery

## 2013-09-27 LAB — NEURONAL NUCLEAR AUTOABS(IFA), IGG-BLD

## 2013-09-27 LAB — CBC
HEMATOCRIT: 27.1 % — AB (ref 36.0–46.0)
HEMOGLOBIN: 8.1 g/dL — AB (ref 12.0–15.0)
MCH: 27.8 pg (ref 26.0–34.0)
MCHC: 29.9 g/dL — AB (ref 30.0–36.0)
MCV: 93.1 fL (ref 78.0–100.0)
Platelets: 330 10*3/uL (ref 150–400)
RBC: 2.91 MIL/uL — ABNORMAL LOW (ref 3.87–5.11)
RDW: 16.9 % — ABNORMAL HIGH (ref 11.5–15.5)
WBC: 8.1 10*3/uL (ref 4.0–10.5)

## 2013-09-27 LAB — COMPREHENSIVE METABOLIC PANEL
ALT: 110 U/L — ABNORMAL HIGH (ref 0–35)
ANION GAP: 11 (ref 5–15)
AST: 45 U/L — ABNORMAL HIGH (ref 0–37)
Albumin: 1.7 g/dL — ABNORMAL LOW (ref 3.5–5.2)
Alkaline Phosphatase: 240 U/L — ABNORMAL HIGH (ref 39–117)
BUN: 64 mg/dL — AB (ref 6–23)
CO2: 21 mEq/L (ref 19–32)
Calcium: 8 mg/dL — ABNORMAL LOW (ref 8.4–10.5)
Chloride: 114 mEq/L — ABNORMAL HIGH (ref 96–112)
Creatinine, Ser: 1.96 mg/dL — ABNORMAL HIGH (ref 0.50–1.10)
GFR calc non Af Amer: 26 mL/min — ABNORMAL LOW (ref >90)
GFR, EST AFRICAN AMERICAN: 30 mL/min — AB (ref >90)
GLUCOSE: 176 mg/dL — AB (ref 70–99)
Potassium: 4.4 mEq/L (ref 3.7–5.3)
SODIUM: 146 meq/L (ref 137–147)
TOTAL PROTEIN: 4.7 g/dL — AB (ref 6.0–8.3)
Total Bilirubin: 0.2 mg/dL — ABNORMAL LOW (ref 0.3–1.2)

## 2013-09-27 LAB — GLUCOSE, CAPILLARY
GLUCOSE-CAPILLARY: 178 mg/dL — AB (ref 70–99)
GLUCOSE-CAPILLARY: 106 mg/dL — AB (ref 70–99)
Glucose-Capillary: 106 mg/dL — ABNORMAL HIGH (ref 70–99)
Glucose-Capillary: 135 mg/dL — ABNORMAL HIGH (ref 70–99)
Glucose-Capillary: 136 mg/dL — ABNORMAL HIGH (ref 70–99)
Glucose-Capillary: 148 mg/dL — ABNORMAL HIGH (ref 70–99)

## 2013-09-27 LAB — PROTEIN AND GLUCOSE, CSF
GLUCOSE CSF: 91 mg/dL — AB (ref 43–76)
Total  Protein, CSF: 12 mg/dL — ABNORMAL LOW (ref 15–45)

## 2013-09-27 LAB — CSF CELL COUNT WITH DIFFERENTIAL
Eosinophils, CSF: NONE SEEN % (ref 0–1)
RBC Count, CSF: 0 /mm3
SEGMENTED NEUTROPHILS-CSF: NONE SEEN % (ref 0–6)
TUBE #: 1
WBC, CSF: 0 /mm3 (ref 0–5)

## 2013-09-27 LAB — RI ANTIBODY, WESTERN BLOT: RI Ab, WBlot: NEGATIVE

## 2013-09-27 LAB — SJOGRENS SYNDROME-B EXTRACTABLE NUCLEAR ANTIBODY: SSB (LA) (ENA) ANTIBODY, IGG: NEGATIVE

## 2013-09-27 LAB — SJOGRENS SYNDROME-A EXTRACTABLE NUCLEAR ANTIBODY: SSA (RO) (ENA) ANTIBODY, IGG: NEGATIVE

## 2013-09-27 LAB — YO ANTIBODY, WESTERN BLOT: Yo ab, western blot: NEGATIVE

## 2013-09-27 LAB — HU ANTIBODY, WESTERN BLOT: HU Ab, WBlot: NEGATIVE

## 2013-09-27 MED ORDER — HEPARIN SODIUM (PORCINE) 5000 UNIT/ML IJ SOLN
5000.0000 [IU] | Freq: Three times a day (TID) | INTRAMUSCULAR | Status: DC
  Administered 2013-09-27 – 2013-09-28 (×2): 5000 [IU] via SUBCUTANEOUS
  Filled 2013-09-27 (×3): qty 1

## 2013-09-27 MED ORDER — HALOPERIDOL LACTATE 5 MG/ML IJ SOLN
INTRAMUSCULAR | Status: AC
  Filled 2013-09-27: qty 1

## 2013-09-27 MED ORDER — HALOPERIDOL LACTATE 5 MG/ML IJ SOLN
5.0000 mg | Freq: Once | INTRAMUSCULAR | Status: AC
  Administered 2013-09-27: 5 mg via INTRAVENOUS

## 2013-09-27 MED ORDER — MIDAZOLAM HCL 2 MG/2ML IJ SOLN
2.0000 mg | INTRAMUSCULAR | Status: DC | PRN
  Administered 2013-09-27: 2 mg via INTRAVENOUS
  Administered 2013-09-27: 4 mg via INTRAVENOUS
  Administered 2013-09-29 (×2): 2 mg via INTRAVENOUS
  Filled 2013-09-27 (×2): qty 4
  Filled 2013-09-27: qty 2
  Filled 2013-09-27: qty 4

## 2013-09-27 MED ORDER — PROPOFOL 10 MG/ML IV EMUL
5.0000 ug/kg/min | INTRAVENOUS | Status: DC
  Administered 2013-09-27 – 2013-09-28 (×10): 50 ug/kg/min via INTRAVENOUS
  Administered 2013-09-28: 45 ug/kg/min via INTRAVENOUS
  Administered 2013-09-29 (×2): 50 ug/kg/min via INTRAVENOUS
  Administered 2013-09-29 (×2): 45 ug/kg/min via INTRAVENOUS
  Administered 2013-09-29 – 2013-10-01 (×10): 50 ug/kg/min via INTRAVENOUS
  Administered 2013-10-01 (×2): 35 ug/kg/min via INTRAVENOUS
  Administered 2013-10-01 – 2013-10-02 (×2): 40 ug/kg/min via INTRAVENOUS
  Administered 2013-10-02: 30 ug/kg/min via INTRAVENOUS
  Administered 2013-10-02 – 2013-10-03 (×4): 40 ug/kg/min via INTRAVENOUS
  Administered 2013-10-03: 20 ug/kg/min via INTRAVENOUS
  Administered 2013-10-03: 50 ug/kg/min via INTRAVENOUS
  Administered 2013-10-03 (×2): 45 ug/kg/min via INTRAVENOUS
  Administered 2013-10-03: 50 ug/kg/min via INTRAVENOUS
  Administered 2013-10-04 (×2): 40 ug/kg/min via INTRAVENOUS
  Administered 2013-10-04 (×3): 45 ug/kg/min via INTRAVENOUS
  Administered 2013-10-05: 20 ug/kg/min via INTRAVENOUS
  Administered 2013-10-05 (×2): 45 ug/kg/min via INTRAVENOUS
  Filled 2013-09-27 (×12): qty 100
  Filled 2013-09-27: qty 200
  Filled 2013-09-27 (×18): qty 100
  Filled 2013-09-27: qty 200
  Filled 2013-09-27 (×5): qty 100
  Filled 2013-09-27: qty 200
  Filled 2013-09-27: qty 100
  Filled 2013-09-27 (×3): qty 200
  Filled 2013-09-27 (×2): qty 100

## 2013-09-27 MED ORDER — MIDAZOLAM HCL 2 MG/2ML IJ SOLN
4.0000 mg | Freq: Once | INTRAMUSCULAR | Status: AC
  Administered 2013-09-27: 4 mg via INTRAVENOUS

## 2013-09-27 MED ORDER — FUROSEMIDE 10 MG/ML IJ SOLN
40.0000 mg | Freq: Once | INTRAMUSCULAR | Status: AC
  Administered 2013-09-27: 40 mg via INTRAVENOUS
  Filled 2013-09-27: qty 4

## 2013-09-27 MED ORDER — GLUCERNA 1.2 CAL PO LIQD
1000.0000 mL | ORAL | Status: DC
  Administered 2013-09-27 – 2013-09-29 (×3): 1000 mL
  Filled 2013-09-27 (×5): qty 1000

## 2013-09-27 NOTE — Procedures (Signed)
Indication: Chorea of unclear etiology  Risks of the procedure were dicussed with the patient including post-LP headache, bleeding, infection, weakness/numbness of legs(radiculopathy), death.  The patient/patient's proxy agreed and written consent was obtained.   The patient was prepped and draped, and using sterile technique a 20 gauge quinke spinal needle was inserted in the L4-5 space. The opening pressure was 22 cm H2O. Approximately 11 cc of clear CSF were obtained and sent for analysis.   The opening pressure is of unclear significance in the setting of intubation and being in the fetal position.   Sending for CSF antibodies, evidence of inflammation.

## 2013-09-27 NOTE — Progress Notes (Addendum)
Subjective: Continues to have abnormal movements.   Exam: Filed Vitals:   09/27/13 1225  BP:   Pulse:   Temp: 98 F (36.7 C)  Resp:    Gen: In bed, writhing MS: awake, follows commands. Able to mouth her name.  OI:ZTIWP, eyes conjugate, fixates and tracks across midline in both directions.  Motor: High amplitude choreiform movements of all four extremities Sensory: endorses sensation bilaterally.   Labs returned: RPR - wnl ANA - normal ACE - normal TSH- normal HIV - non-reactive Cerruloplasmin - noral Lyme negative Cardiolipin ab - negative Cortisol - negative Carboxyhemoglobin - nml PTH - high at 102 with normal calcium.  Anti-DNAse B - negative  Labs pending - HD genotype(unlikely) Anti-hu, yo, ri Anti-crmp5 SSA SSB  ABNORMAL LABS: Thiamine < 7.  Impression: 63 yo F with choreiform movements of subacute time course. Given the time course, autoimmune etiology has been considered and she was started on steroids. She has completed a 5 days course of solumedrol without significant response. At this time, with a suspicious mass, I would think that if this is neoplastic, then excision of this mass would be the next step in treatent.   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   Further immunotherapy I would think should consist of plasma exchange, but I would favor evaluation and treatment of possible neoplasm prior to pursuing this course.   Also, in the interim, will continue treating with thiamine as thiamine deficiency has been linked with chorea in case reports.   Recommendations: 1) would continue to workup breast mass. Preferably would excise mass if malignant and start PLEX thereafter.  2) Continue haldol to 5mg  TID 3) continue clonazepam 1mg  TID.  4) thiamine 500mg  IV TID thorugh today, then start 100mg  daily tomorrow.  5) supportive therapy.  6) anti-crmp5 not collected previously,  will re-order  Roland Rack, MD [facility] [phone]  If 7pm- 7am, please page neurology on call as listed in [place].   LP was performed for evidence of inflamation(pleocytosis or IgG index).   Also sent was: Oligoclonal bands CSF CRMP5 and anti-hu,yo,ri CSF ANNA/PCCA antibodies  Also on serum: GAD CASPR2  Roland Rack, MD [facility] [phone]  If 7pm- 7am, please page neurology on call as listed in [place].

## 2013-09-27 NOTE — Progress Notes (Signed)
PULMONARY / CRITICAL CARE MEDICINE  Name: [name] MRN: [id number] DOB: 1950/06/27    ADMISSION DATE:  09/05/2013 CONSULTATION DATE:  09/12/2013  REFERRING MD :  Thereasa Solo   CHIEF COMPLAINT:  Concern for airway protection   INITIAL PRESENTATION: 63 yo with three week history of choreiform movements presents 8/28 with worsening involuntary movements, metabolic acidosis, rhabdomyolysis  and AKI.   STUDIES / EVENTS:  8/28  Admitted 8/31  MRI brain: bilateral basal ganglial altered signal intensity of uncertain etiology 1/02  Intubated in MRI secondary to oversedation, extubated upon arrival to ICU.  Re-intubated for "severe chorea and hypoxemic resp failure" 8/31  TTE: LVEF 45%, grade 2 DD 9/02  CT chest / abdomen / pelvis: 1.2 cm L breast mass, small bilateral effusions / atelectasis, atrophic R kidney with bilateral cysts 9/03 [place] R breast: Suspicious right breast mass 9/04 Extubated 9/05 Updated family in detail about findings and care plan 9/07 Was conversant with RN, then sudden onset of central apnea and junctional bradycardia. HR responded to atropine. Intubated for third time this hospitalization 9/07 CT head: No acute change 9/07 EEG: no epileptiform discharges  LINES/TUBES: ETT 8/31 >> 8/31, 8/31 >> 9/04, 9/07 >>  L IJ CVL 8/31 >>   MICRO: Urine 8/28 >> NEG Blood 8/29 >> NEG  SUBJ:  Persistent choreiform movements. + F/C intermittently  VITAL SIGNS: Temp:  [97.6 F (36.4 C)-98.6 F (37 C)] 98 F (36.7 C) (09/08 1225) Pulse Rate:  [51-88] 63 (09/08 1151) Resp:  [9-34] 19 (09/08 1151) BP: (99-147)/(48-71) 121/53 mmHg (09/08 0900) SpO2:  [99 %-100 %] 100 % (09/08 1151) FiO2 (%):  [40 %] 40 % (09/08 1151) Weight:  [89.5 kg (197 lb 5 oz)] 89.5 kg (197 lb 5 oz) (09/08 0400)  HEMODYNAMICS:   VENTILATOR SETTINGS: Vent Mode:  [-] PRVC FiO2 (%):  [40 %] 40 % Set Rate:  [16 bmp] 16 bmp Vt Set:  [480 mL] 480 mL PEEP:  [5 cmH20] 5 cmH20 Plateau Pressure:  [13  cmH20-22 cmH20] 22 cmH20 INTAKE / OUTPUT: Intake/Output     09/07 0701 - 09/08 0700 09/08 0701 - 09/09 0700   I.V. (mL/kg) 901 (10.1) 64.6 (0.7)   NG/GT 1970 320   IV Piggyback 150    Total Intake(mL/kg) 3021 (33.8) 384.6 (4.3)   Urine (mL/kg/hr) 3150 (1.5)    Stool 100 (0)    Total Output 3250     Net -229 +384.6         PHYSICAL EXAMINATION: General: RASS -1, + F/C intermittently Neuro: MAEs, Choreiform movements noted. Cognition cannot be tested HEENT: PERRL, EOMI Cardiovascular: Regular, no murmurs Lungs: Clear anteriorly Abdomen:  Soft, bowel sounds present Ext: Trace edema, warm  LABS: I have reviewed all of today's lab results. 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  40 mins CCM time. Husband updated @ bedside.   Merton Border, MD [facility] [place] Pager: [phone]  09/27/2013, 1:39 PM

## 2013-09-27 NOTE — Consult Note (Signed)
[name] 07-10-1950  [id number].   Requesting MD: Dr. Merton Border Chief Complaint/Reason for Consult: breast mass HPI: This is a 63 yo black female with a h/o HTN, DM, and noncompliance who was admitted 12 days ago with 2 weeks of choreiform type movements.  She has had rhabdomyolysis on admission with metabolic acidosis.  She has had an extensive work up.  She was found to have a 1.2cm right breast mass on her CT scan of her chest.  An [place] was then obtained which confirmed a 1.5x1.0x1.0cm right breast mass at 12 oclock 3 cm above the nipple.  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  We have been asked to see for biopsy or removal of this tumor.  ROS : intubated, unable to obtain  Family History  Problem Relation Age of Onset  . Diabetes    . Hypertension    . Hypertension Father   . Diabetes Father   . Hypertension Mother   . Diabetes Mother   . Diabetes Sister   . Hypertension Sister   . Diabetes Sister   . Hypertension Sister     Past Medical History  Diagnosis Date  . Hypertension   . Diabetes mellitus   . Hyperlipidemia   . History of noncompliance with medical treatment   . Shingles 08/18/2013    Past Surgical History  Procedure Laterality Date  . Diabetes    . Fibroids of breast    . Radiology with anesthesia N/A 08/25/2013    Procedure: MRI OF BRAIN;  Surgeon: Medication Radiologist, MD;  Location: [facility];  Service: Radiology;  Laterality: N/A;    Social History:  reports that she has never smoked. She has never used smokeless tobacco. She reports that she does not drink alcohol or use illicit drugs.  Allergies:  Allergies  Allergen Reactions  . Codeine Nausea And Vomiting  . Morphine And Related Other (See Comments)    Extreme sedation  . Peanut-Containing Drug Products Nausea And Vomiting  . Grapefruit Concentrate Itching    Medications Prior to Admission  Medication Sig Dispense Refill  . amLODipine-olmesartan (AZOR) 10-40 MG per tablet  Take 1 tablet by mouth daily.  90 tablet  3  . aspirin EC 81 MG tablet Take 1 tablet (81 mg total) by mouth daily.  30 tablet  0  . atorvastatin (LIPITOR) 10 MG tablet Take 1 tablet (10 mg total) by mouth daily at 6 PM.  30 tablet  0  . benztropine (COGENTIN) 0.5 MG tablet Take 1 tablet (0.5 mg total) by mouth 2 (two) times daily.  60 tablet  1  . carvedilol (COREG) 6.25 MG tablet Take 1 tablet (6.25 mg total) by mouth 2 (two) times daily with a meal.  60 tablet  0  . clonazePAM (KLONOPIN) 1 MG tablet Take 1 tablet (1 mg total) by mouth 2 (two) times daily.  30 tablet  0  . escitalopram (LEXAPRO) 5 MG tablet Take 5 mg by mouth at bedtime.      . furosemide (LASIX) 20 MG tablet Take 20 mg by mouth every morning.      . insulin aspart (NOVOLOG) 100 UNIT/ML injection Inject 0-10 Units into the skin 3 (three) times daily with meals. Before each meal 3 times a day, 140-199 - 2 units, 200-250 - 4 units, 251-299 - 6 units,  300-349 - 8 units,  350 or above 10 units. Insulin PEN if approved, provide syringes and needles if needed.  10 mL  11  .  insulin glargine (LANTUS) 100 UNIT/ML injection Inject 0.08 mLs (8 Units total) into the skin 2 (two) times daily.  10 mL  11  . naproxen sodium (ANAPROX) 220 MG tablet Take 220 mg by mouth daily as needed (for fever).      . polyethylene glycol (MIRALAX / GLYCOLAX) packet Take 17 g by mouth daily.  14 each  0  . Tetrahydrozoline HCl (VISINE OP) Place 1 drop into both eyes daily as needed (dry eyes).        Blood pressure 121/53, pulse 63, temperature 98 F (36.7 C), temperature source Oral, resp. rate 19, height 5\' 6"  (1.676 m), weight 197 lb 5 oz (89.5 kg), SpO2 100.00%. Physical Exam: General: WD, WN black female who is laying in bed in NAD, occasional choreiform movements HEENT: head is normocephalic, atraumatic. Ears and nose without any masses or lesions.  Mouth is pink. ETT in place Heart: regular, rate, and rhythm.  Normal s1,s2. No obvious murmurs,  gallops, or rubs noted.  Palpable radial and pedal pulses bilaterally Lungs: CTAB, no wheezes, rhonchi, or rales noted.  Assisted respiratory effort from vent Breast: no definite breast mass is able to be palpated.  There are no masses in the left breast.  No nipple discharge, no peau d'orange.    Results for orders placed during the hospital encounter of 09/15/2013 (from the past 48 hour(s))  GLUCOSE, CAPILLARY     Status: Abnormal   Collection Time    09/25/13  3:45 PM      Result Value Ref Range   Glucose-Capillary 129 (*) 70 - 99 mg/dL  GLUCOSE, CAPILLARY     Status: Abnormal   Collection Time    09/25/13  7:33 PM      Result Value Ref Range   Glucose-Capillary 136 (*) 70 - 99 mg/dL   Comment 1 Notify RN    GLUCOSE, CAPILLARY     Status: Abnormal   Collection Time    09/26/13 12:23 AM      Result Value Ref Range   Glucose-Capillary 129 (*) 70 - 99 mg/dL   Comment 1 Notify RN    GLUCOSE, CAPILLARY     Status: Abnormal   Collection Time    09/26/13  3:48 AM      Result Value Ref Range   Glucose-Capillary 144 (*) 70 - 99 mg/dL   Comment 1 Notify RN    CBC     Status: Abnormal   Collection Time    09/26/13  4:38 AM      Result Value Ref Range   WBC 9.2  4.0 - 10.5 K/uL   RBC 2.94 (*) 3.87 - 5.11 MIL/uL   Hemoglobin 8.1 (*) 12.0 - 15.0 g/dL   HCT 11/26/13 (*) 16.6 - 19.6 %   MCV 92.5  78.0 - 100.0 fL   MCH 27.6  26.0 - 34.0 pg   MCHC 29.8 (*) 30.0 - 36.0 g/dL   RDW 94.0 (*) 98.2 - 86.7 %   Platelets 395  150 - 400 K/uL  BASIC METABOLIC PANEL     Status: Abnormal   Collection Time    09/26/13  4:38 AM      Result Value Ref Range   Sodium 147  137 - 147 mEq/L   Potassium 4.2  3.7 - 5.3 mEq/L   Chloride 118 (*) 96 - 112 mEq/L   CO2 20  19 - 32 mEq/L   Glucose, Bld 161 (*) 70 - 99 mg/dL   BUN  80 (*) 6 - 23 mg/dL   Creatinine, Ser 2.19 (*) 0.50 - 1.10 mg/dL   Calcium 8.2 (*) 8.4 - 10.5 mg/dL   GFR calc non Af Amer 23 (*) >90 mL/min   GFR calc Af Amer 26 (*) >90 mL/min    Comment: (NOTE)     The eGFR has been calculated using the CKD EPI equation.     This calculation has not been validated in all clinical situations.     eGFR's persistently <90 mL/min signify possible Chronic Kidney     Disease.   Anion gap 9  5 - 15  GLUCOSE, CAPILLARY     Status: Abnormal   Collection Time    09/26/13  8:07 AM      Result Value Ref Range   Glucose-Capillary 125 (*) 70 - 99 mg/dL  GLUCOSE, CAPILLARY     Status: Abnormal   Collection Time    09/26/13 12:24 PM      Result Value Ref Range   Glucose-Capillary 137 (*) 70 - 99 mg/dL  GLUCOSE, CAPILLARY     Status: Abnormal   Collection Time    09/26/13  3:27 PM      Result Value Ref Range   Glucose-Capillary 122 (*) 70 - 99 mg/dL  GLUCOSE, CAPILLARY     Status: Abnormal   Collection Time    09/26/13  7:51 PM      Result Value Ref Range   Glucose-Capillary 100 (*) 70 - 99 mg/dL  GLUCOSE, CAPILLARY     Status: Abnormal   Collection Time    09/26/13 11:43 PM      Result Value Ref Range   Glucose-Capillary 148 (*) 70 - 99 mg/dL   Comment 1 Documented in Chart     Comment 2 Notify RN    GLUCOSE, CAPILLARY     Status: Abnormal   Collection Time    09/27/13  4:02 AM      Result Value Ref Range   Glucose-Capillary 178 (*) 70 - 99 mg/dL   Comment 1 Documented in Chart     Comment 2 Notify RN    CBC     Status: Abnormal   Collection Time    09/27/13  4:28 AM      Result Value Ref Range   WBC 8.1  4.0 - 10.5 K/uL   RBC 2.91 (*) 3.87 - 5.11 MIL/uL   Hemoglobin 8.1 (*) 12.0 - 15.0 g/dL   HCT 27.1 (*) 36.0 - 46.0 %   MCV 93.1  78.0 - 100.0 fL   MCH 27.8  26.0 - 34.0 pg   MCHC 29.9 (*) 30.0 - 36.0 g/dL   RDW 16.9 (*) 11.5 - 15.5 %   Platelets 330  150 - 400 K/uL  COMPREHENSIVE METABOLIC PANEL     Status: Abnormal   Collection Time    09/27/13  4:28 AM      Result Value Ref Range   Sodium 146  137 - 147 mEq/L   Potassium 4.4  3.7 - 5.3 mEq/L   Chloride 114 (*) 96 - 112 mEq/L   CO2 21  19 - 32 mEq/L   Glucose,  Bld 176 (*) 70 - 99 mg/dL   BUN 64 (*) 6 - 23 mg/dL   Creatinine, Ser 1.96 (*) 0.50 - 1.10 mg/dL   Calcium 8.0 (*) 8.4 - 10.5 mg/dL   Total Protein 4.7 (*) 6.0 - 8.3 g/dL   Albumin 1.7 (*) 3.5 - 5.2 g/dL  AST 45 (*) 0 - 37 U/L   ALT 110 (*) 0 - 35 U/L   Alkaline Phosphatase 240 (*) 39 - 117 U/L   Total Bilirubin 0.2 (*) 0.3 - 1.2 mg/dL   GFR calc non Af Amer 26 (*) >90 mL/min   GFR calc Af Amer 30 (*) >90 mL/min   Comment: (NOTE)     The eGFR has been calculated using the CKD EPI equation.     This calculation has not been validated in all clinical situations.     eGFR's persistently <90 mL/min signify possible Chronic Kidney     Disease.   Anion gap 11  5 - 15  GLUCOSE, CAPILLARY     Status: Abnormal   Collection Time    09/27/13 12:25 PM      Result Value Ref Range   Glucose-Capillary 106 (*) 70 - 99 mg/dL   Ct Head Wo Contrast  09/26/2013   CLINICAL DATA:  63 year old female with diabetes, high blood pressure and hyperlipidemia presenting with altered mental status. Coded.  EXAM: CT HEAD WITHOUT CONTRAST  TECHNIQUE: Contiguous axial images were obtained from the base of the skull through the vertex without intravenous contrast.  COMPARISON:  08/28/2013 brain MR. 08/17/2013 head CT.  FINDINGS: No intracranial hemorrhage or CT evidence of large acute infarct.  The basal ganglia abnormality noted on the recent MR is not as well appreciated on present CT (CT not as sensitive for detection of such findings). In addition to previously described considerations as cause for basal ganglia abnormality, metabolic abnormality including vitamin B12 deficiency can sometimes present with this MR finding.  No intracranial mass lesion noted on this unenhanced exam.  Mild global atrophy.  No hydrocephalus  IMPRESSION: No intracranial hemorrhage or CT evidence of large acute infarct. Please see above discussion.   Electronically Signed   By: Chauncey Cruel M.D.   On: 09/26/2013 10:57   Dg Chest Port 1  View  09/27/2013   CLINICAL DATA:  Followup respiratory failure  EXAM: PORTABLE CHEST - 1 VIEW  COMPARISON:  09/26/2013  FINDINGS: Cardiac shadow is stable. A left-sided central venous line is again noted in the proximal superior vena cava. An endotracheal tube is seen 3.7 cm above the carina. Feeding catheter is noted extending into the stomach. Patchy infiltrative changes are again identified bilaterally but overall improved when compare with the prior study.  IMPRESSION: Improved airspace disease bilaterally.  Tubes and lines as described, stable in appearance   Electronically Signed   By: Inez Catalina M.D.   On: 09/27/2013 07:46   Dg Chest Port 1 View  09/26/2013   CLINICAL DATA:  Respiratory failure and intubation.  EXAM: PORTABLE CHEST - 1 VIEW  COMPARISON:  09/23/2013  FINDINGS: The patient has been reintubated with the endotracheal tube tip approximately 1 cm above the carina. Lungs show diffuse worsening of bilateral pulmonary airspace disease which may be a combination of pulmonary edema and also potentially ARDS. No significant pleural fluid identified. The heart size and mediastinal contours are stable. No pneumothorax. Stable positioning of central line with tip in the upper SVC.  IMPRESSION: Endotracheal tube tip approximately 1 cm above the carina. Diffuse worsening of bilateral pulmonary airspace disease.   Electronically Signed   By: Aletta Edouard M.D.   On: 09/26/2013 09:54       Assessment/Plan 1. Choreiform movements 2. VDRF 3. AKI 4. CHF 5. Metabolic acidosis 6. Right breast mass  Plan: 1. This is a difficult situation.  To be appropriately worked up the patient ideally needs a mammogram and [place] with stereotatic biopsy.  Given she is currently on the ventilator, that is not reasonable right now.  This mass is not palpable.  Dr. Ninfa Linden would only take her to the OR if radiology could [place] her the day of surgery and place an X over the exact location.  Even at this, it is no  guarantee that this is malignant.  There is also no guarantee, because of inappropriate workup, that if this was malignant, that all of it would be removed at the time of surgery.  This is not the standard of care for this type of problem.  A PET scan may be helpful as well, but clearly with her being an inpatient, this is not feasible either.   2. We will make her NPO p MN.  Dr. Ninfa Linden will try to speak to radiology to see if they may be able to reultrasound her.  If they are not, since this is not palpable, we may not be able to proceed.  Dr. Ninfa Linden will make further recommendations.   Calisa Luckenbaugh E 09/27/2013, 2:28 PM Pager: [phone]

## 2013-09-27 NOTE — Consult Note (Signed)
I have seen and examined the patient and agree with the assessment and plans.  This is a very difficult situation.  I doubt highly that this breast mass is the cause of her neurologic condition.  She has not even had complete imaging.  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  The CT scan and limited U/S are not impressive and I can not palpate a mass. The standard of care would be to get a dedicated mammograms and complete breast ultrasound.  She may not even need a biopsy if these are consistent with a fibroadenoma.  If the films are worrisome, she could then have an ultrasound guided needle/core biopsy without need for surgery.  If she extubates, she can be set up to be transported to the breast center for these studies. If she can not extubate, the only surgical option I would have is to have the radiologists ultrasound her again and place an X on the breast where the mass might be.  I would then have to perform a very generous lumpectomy in the hopes of removing the lesion with the risk being missing the lesion completely.  If it ended up being a malignancy, she could potentially need further surgery (repeat lumpectomy vs mastectomy) and lymph node biopsy. Another option is to have her get a CT PET scan to seen if any areas in her body light up to suggest malignancy.  We will follow her.  Netta Fodge A. Ninfa Linden  MD, FACS

## 2013-09-28 ENCOUNTER — Inpatient Hospital Stay (HOSPITAL_COMMUNITY): Payer: BC Managed Care – PPO

## 2013-09-28 LAB — GLUCOSE, CAPILLARY
GLUCOSE-CAPILLARY: 67 mg/dL — AB (ref 70–99)
GLUCOSE-CAPILLARY: 113 mg/dL — AB (ref 70–99)
GLUCOSE-CAPILLARY: 134 mg/dL — AB (ref 70–99)
Glucose-Capillary: 104 mg/dL — ABNORMAL HIGH (ref 70–99)
Glucose-Capillary: 106 mg/dL — ABNORMAL HIGH (ref 70–99)
Glucose-Capillary: 50 mg/dL — ABNORMAL LOW (ref 70–99)
Glucose-Capillary: 55 mg/dL — ABNORMAL LOW (ref 70–99)
Glucose-Capillary: 81 mg/dL (ref 70–99)
Glucose-Capillary: 86 mg/dL (ref 70–99)

## 2013-09-28 LAB — COMPREHENSIVE METABOLIC PANEL
ALBUMIN: 1.6 g/dL — AB (ref 3.5–5.2)
ALT: 81 U/L — ABNORMAL HIGH (ref 0–35)
ANION GAP: 10 (ref 5–15)
AST: 21 U/L (ref 0–37)
Alkaline Phosphatase: 218 U/L — ABNORMAL HIGH (ref 39–117)
BUN: 51 mg/dL — AB (ref 6–23)
CALCIUM: 7.9 mg/dL — AB (ref 8.4–10.5)
CO2: 23 mEq/L (ref 19–32)
Chloride: 114 mEq/L — ABNORMAL HIGH (ref 96–112)
Creatinine, Ser: 1.86 mg/dL — ABNORMAL HIGH (ref 0.50–1.10)
GFR calc Af Amer: 32 mL/min — ABNORMAL LOW (ref >90)
GFR calc non Af Amer: 28 mL/min — ABNORMAL LOW (ref >90)
Glucose, Bld: 76 mg/dL (ref 70–99)
Potassium: 3.8 mEq/L (ref 3.7–5.3)
SODIUM: 147 meq/L (ref 137–147)
TOTAL PROTEIN: 4.6 g/dL — AB (ref 6.0–8.3)
Total Bilirubin: 0.2 mg/dL — ABNORMAL LOW (ref 0.3–1.2)

## 2013-09-28 LAB — VITAMIN B12: Vitamin B-12: 368 pg/mL (ref 211–911)

## 2013-09-28 LAB — CBC
HCT: 26.4 % — ABNORMAL LOW (ref 36.0–46.0)
HEMOGLOBIN: 7.9 g/dL — AB (ref 12.0–15.0)
MCH: 27.8 pg (ref 26.0–34.0)
MCHC: 29.9 g/dL — AB (ref 30.0–36.0)
MCV: 93 fL (ref 78.0–100.0)
Platelets: 289 10*3/uL (ref 150–400)
RBC: 2.84 MIL/uL — ABNORMAL LOW (ref 3.87–5.11)
RDW: 17 % — AB (ref 11.5–15.5)
WBC: 11.7 10*3/uL — ABNORMAL HIGH (ref 4.0–10.5)

## 2013-09-28 LAB — TRIGLYCERIDES: TRIGLYCERIDES: 236 mg/dL — AB (ref <150)

## 2013-09-28 MED ORDER — FREE WATER
200.0000 mL | Status: DC
  Administered 2013-09-28 – 2013-10-01 (×16): 200 mL

## 2013-09-28 MED ORDER — DEXTROSE 5 % IV SOLN
INTRAVENOUS | Status: AC
  Administered 2013-09-28: 13:00:00 via INTRAVENOUS

## 2013-09-28 MED ORDER — DEXTROSE 50 % IV SOLN
50.0000 mL | Freq: Once | INTRAVENOUS | Status: AC
  Administered 2013-09-28: 50 mL via INTRAVENOUS

## 2013-09-28 MED ORDER — DEXTROSE 50 % IV SOLN
INTRAVENOUS | Status: AC
  Administered 2013-09-28: 50 mL
  Filled 2013-09-28: qty 50

## 2013-09-28 MED ORDER — LIDOCAINE HCL (PF) 1 % IJ SOLN
INTRAMUSCULAR | Status: AC
  Filled 2013-09-28: qty 10

## 2013-09-28 MED ORDER — INSULIN GLARGINE 100 UNIT/ML ~~LOC~~ SOLN
20.0000 [IU] | Freq: Every day | SUBCUTANEOUS | Status: DC
  Administered 2013-09-28 – 2013-10-02 (×5): 20 [IU] via SUBCUTANEOUS
  Filled 2013-09-28 (×6): qty 0.2

## 2013-09-28 MED ORDER — HEPARIN SODIUM (PORCINE) 5000 UNIT/ML IJ SOLN
5000.0000 [IU] | Freq: Three times a day (TID) | INTRAMUSCULAR | Status: DC
  Administered 2013-09-29 – 2013-10-05 (×18): 5000 [IU] via SUBCUTANEOUS
  Filled 2013-09-28 (×22): qty 1

## 2013-09-28 MED ORDER — DEXTROSE 50 % IV SOLN
INTRAVENOUS | Status: AC
  Filled 2013-09-28: qty 50

## 2013-09-28 MED ORDER — DEXTROSE 50 % IV SOLN
INTRAVENOUS | Status: AC
  Administered 2013-09-28: 25 mL
  Filled 2013-09-28: qty 50

## 2013-09-28 NOTE — Procedures (Signed)
US guided core biopsies of the right breast nodule.  Nodule located in the 12 o'clock position.  No immediate complication.

## 2013-09-28 NOTE — Progress Notes (Signed)
Patient ID: [name], female   DOB: [birth year], 63 y.o.   MRN: [id number]  I discussed her case with Dr. Donne Hazel for another opinion regarding the breast mass.  He agrees that given the appearance on the CT and the limited ultrasound as well as her history of fibroadenomas of the breast, that this is probably benign.  We recommend that if she remains intubated, you should consult IR for an ultrasound guided biopsy of the mass.  If she extubates, then she needs dedicated mammography and ultrasound at the breast center.  If a biopsy is benign, then no surgery will be needed.  Nothing further to offer other than the above recommendations.

## 2013-09-28 NOTE — Progress Notes (Signed)
NEURO HOSPITALIST PROGRESS NOTE   SUBJECTIVE:                                                                                                                        Remains on propofol, intubated on the vent. Had right breast nodule biopsy today. CSF paraneoplastic/autoimmune studies sent. On haldol.    OBJECTIVE:                                                                                                                           Vital signs in last 24 hours: Temp:  [98.4 F (36.9 C)-99.6 F (37.6 C)] 99 F (37.2 C) (09/09 1554) Pulse Rate:  [81-103] 87 (09/09 1604) Resp:  [13-28] 16 (09/09 1604) BP: (70-154)/(49-99) 118/52 mmHg (09/09 1300) SpO2:  [98 %-100 %] 100 % (09/09 1604) FiO2 (%):  [40 %] 40 % (09/09 1604) Weight:  [90.8 kg (200 lb 2.8 oz)] 90.8 kg (200 lb 2.8 oz) (09/09 0428)  Intake/Output from previous day: 09/08 0701 - 09/09 0700 In: 2017.3 [I.V.:527.3; NG/GT:1340; IV Piggyback:150] Out: 2423 [Urine:3175] Intake/Output this shift: Total I/O In: 275.2 [I.V.:215.2; NG/GT:60] Out: -  Nutritional status: NPO  Past Medical History  Diagnosis Date  . Hypertension   . Diabetes mellitus   . Hyperlipidemia   . History of noncompliance with medical treatment   . Shingles 08/18/2013    Neurologic Exam:  Sedated on propofol, will defer neuro-exam at this moment as attempt to lower propofol results in reemergence of choreic movements.  Lab Results: Lab Results  Component Value Date/Time   CHOL 287* 08/18/2013  2:44 AM   Lipid Panel  Recent Labs  09/28/13 0255  TRIG 236*    Studies/Results: Dg Chest Port 1 View  09/28/2013   CLINICAL DATA:  Respiratory failure.  EXAM: PORTABLE CHEST - 1 VIEW  COMPARISON:  09/27/2013.  CT 09/21/2013.  FINDINGS: Endotracheal tube, orogastric tube, left IJ line in stable position. Heart size stable, no pulmonary venous congestion. Persistent bilateral airspace disease in basilar  atelectasis. Small pleural effusions cannot be excluded. No pneumothorax. No acute osseous abnormality  IMPRESSION: 1. Line and tube position stable. 2. Persistent bilateral airspace disease in basilar atelectasis, no change. 3. Small bilateral pleural effusions cannot  be excluded .   Electronically Signed   By: Marcello Moores  Register   On: 09/28/2013 07:42   Dg Chest Port 1 View  09/27/2013   CLINICAL DATA:  Followup respiratory failure  EXAM: PORTABLE CHEST - 1 VIEW  COMPARISON:  09/26/2013  FINDINGS: Cardiac shadow is stable. A left-sided central venous line is again noted in the proximal superior vena cava. An endotracheal tube is seen 3.7 cm above the carina. Feeding catheter is noted extending into the stomach. Patchy infiltrative changes are again identified bilaterally but overall improved when compare with the prior study.  IMPRESSION: Improved airspace disease bilaterally.  Tubes and lines as described, stable in appearance   Electronically Signed   By: Inez Catalina M.D.   On: 09/27/2013 07:46    MEDICATIONS                                                                                                                        Scheduled: . antiseptic oral rinse  7 mL Mouth Rinse QID  . aspirin  81 mg Per Tube Daily  . chlorhexidine  15 mL Mouth Rinse BID  . clonazePAM  1 mg Per Tube TID  . famotidine  20 mg Per Tube QHS  . free water  200 mL Per Tube Q4H  . [START ON 09/29/2013] heparin  5,000 Units Subcutaneous 3 times per day  . insulin aspart  0-9 Units Subcutaneous 6 times per day  . insulin glargine  20 Units Subcutaneous QHS  . lidocaine (PF)      . tetrahydrozoline  2 drop Both Eyes BID  . thiamine  100 mg Per Tube Daily    ASSESSMENT/PLAN:                                                                                                           63 y/o with chorea of unclear etiology. CSF paraneoplastic/autoimmune profile sent. I support trying to extubate and trying to wean off  propofol, and then give her a trial of PLEX if chorea still present. In the meantime, will stop haldol. Will follow up.  Dorian Pod, MD [facility] [phone]  09/28/2013, 5:26 PM

## 2013-09-28 NOTE — Progress Notes (Signed)
PULMONARY / CRITICAL CARE MEDICINE  Name: [name] MRN: [id number] DOB: 09/10/1950    ADMISSION DATE:  08/25/2013 CONSULTATION DATE:  09/14/2013  REFERRING MD :  Thereasa Solo   CHIEF COMPLAINT:  Concern for airway protection   INITIAL PRESENTATION: 62 yo with three week history of choreiform movements presents 8/28 with worsening involuntary movements, metabolic acidosis, rhabdomyolysis  and AKI.   STUDIES / EVENTS:  8/28  Admitted 8/31  MRI brain: bilateral basal ganglial altered signal intensity of uncertain etiology 4/92  Intubated in MRI secondary to oversedation, extubated upon arrival to ICU.  Re-intubated for "severe chorea and hypoxemic resp failure" 8/31  TTE: LVEF 45%, grade 2 DD 9/02  CT chest / abdomen / pelvis: 1.2 cm L breast mass, small bilateral effusions / atelectasis, atrophic R kidney with bilateral cysts 9/03 [place] R breast: Suspicious right breast mass 9/04 Extubated 9/05 Updated family in detail about findings and care plan 9/07 Was conversant with RN, then sudden onset of central apnea and junctional bradycardia. HR responded to atropine. Intubated for third time this hospitalization 9/07 CT head: No acute change 9/07 EEG: no epileptiform discharges 9/08 CCS consult for possible breast mass bx. Not palpable - therefore, US guided bx by IR requested.   LINES/TUBES: ETT 8/31 >> 8/31, 8/31 >> 9/04, 9/07 >>  L IJ CVL 8/31 >>   MICRO: Urine 8/28 >> NEG Blood 8/29 >> NEG  SUBJ:  Sedated on propofol. No WUA or SBT performed due to planned procedure  VITAL SIGNS: Temp:  [98.4 F (36.9 C)-99.6 F (37.6 C)] 98.9 F (37.2 C) (09/09 1300) Pulse Rate:  [81-103] 87 (09/09 1300) Resp:  [13-28] 13 (09/09 1300) BP: (70-154)/(49-125) 118/52 mmHg (09/09 1300) SpO2:  [98 %-100 %] 100 % (09/09 1300) FiO2 (%):  [40 %] 40 % (09/09 1300) Weight:  [90.8 kg (200 lb 2.8 oz)] 90.8 kg (200 lb 2.8 oz) (09/09 0428)  HEMODYNAMICS:   VENTILATOR SETTINGS: Vent Mode:  [-]  PRVC FiO2 (%):  [40 %] 40 % Set Rate:  [16 bmp] 16 bmp Vt Set:  [480 mL] 480 mL PEEP:  [5 cmH20] 5 cmH20 Plateau Pressure:  [16 cmH20-18 cmH20] 17 cmH20 INTAKE / OUTPUT: Intake/Output     09/08 0701 - 09/09 0700 09/09 0701 - 09/10 0700   I.V. (mL/kg) 527.3 (5.8) 161.4 (1.8)   NG/GT 1340 60   IV Piggyback 150    Total Intake(mL/kg) 2017.3 (22.2) 221.4 (2.4)   Urine (mL/kg/hr) 3175 (1.5)    Stool     Total Output 3175     Net -1157.7 +221.4         PHYSICAL EXAMINATION: General: RASS -1, + F/C intermittently Neuro: MAEs. Cognition cannot be tested HEENT: PERRL, EOMI Cardiovascular: Regular, no murmurs Lungs: Clear anteriorly Abdomen:  Soft, bowel sounds present Ext: Trace edema, warm  LABS: I have reviewed all of today's lab results. 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  35 mins CCM time. Husband updated @ bedside.   Merton Border, MD [facility] [place] Pager: [phone]  09/28/2013, 3:39 PM

## 2013-09-28 NOTE — Progress Notes (Signed)
Patient ID: [name], female   DOB: [birth year], 63 y.o.   MRN: [id number] Request received for US guided rt breast mass biopsy on pt . Hx noted, labs checked, images reviewed by Dr. Anselm Pancoast. Details/risks of procedure d/w pt's daughter , Thomasene Dubow, with her understanding and consent.

## 2013-09-28 NOTE — Progress Notes (Signed)
Inpatient Diabetes Program Recommendations  AACE/ADA: New Consensus Statement on Inpatient Glycemic Control (2013)  Target Ranges:  Prepandial:   less than 140 mg/dL      Peak postprandial:   less than 180 mg/dL (1-2 hours)      Critically ill patients:  140 - 180 mg/dL   Results for LOURDES, KUCHARSKI (MRN [id number]) as of 09/28/2013 09:32  Ref. Range 09/27/2013 08:38 09/27/2013 12:25 09/27/2013 15:39 09/27/2013 19:46 09/28/2013 00:08 09/28/2013 03:42 09/28/2013 04:37 09/28/2013 07:51 09/28/2013 08:53  Glucose-Capillary Latest Range: 70-99 mg/dL 136 (H) 106 (H) 106 (H) 135 (H) 106 (H) 67 (L) 104 (H) 55 (L) 113 (H)    Outpatient Diabetes medications: Lantus 8 units BID, Novolog 0-10 units TID Current orders for Inpatient glycemic control: Lantus 35 units QHS, Novolog 0-9 units Q4H  Inpatient Diabetes Program Recommendations Insulin - Basal: Noted tube feeding on hold since midnight and CBG 67 mg/dl at 3:42 and 55 mg/dl @ 7:51 this morning.  If tube feeding will continue to be held, please consider decreasing Lantus to prevent further episodes of hypoglycemia. May want to consider adding dextrose to IV fluids while tube feeding is being held since patient received Lantus 35 units last night and tube feedings are on hold at this time.  Thanks, Barnie Alderman, RN, MSN, CCRN Diabetes Coordinator Inpatient Diabetes Program [phone] (Team Pager) [phone] ([facility] office) [phone] [facility] office)

## 2013-09-29 ENCOUNTER — Inpatient Hospital Stay (HOSPITAL_COMMUNITY): Payer: BC Managed Care – PPO

## 2013-09-29 LAB — CBC
HEMATOCRIT: 26.9 % — AB (ref 36.0–46.0)
Hemoglobin: 8 g/dL — ABNORMAL LOW (ref 12.0–15.0)
MCH: 27.5 pg (ref 26.0–34.0)
MCHC: 29.7 g/dL — AB (ref 30.0–36.0)
MCV: 92.4 fL (ref 78.0–100.0)
Platelets: 281 10*3/uL (ref 150–400)
RBC: 2.91 MIL/uL — ABNORMAL LOW (ref 3.87–5.11)
RDW: 17.2 % — ABNORMAL HIGH (ref 11.5–15.5)
WBC: 13 10*3/uL — ABNORMAL HIGH (ref 4.0–10.5)

## 2013-09-29 LAB — BASIC METABOLIC PANEL WITH GFR
Anion gap: 9 (ref 5–15)
BUN: 32 mg/dL — ABNORMAL HIGH (ref 6–23)
CO2: 24 meq/L (ref 19–32)
Calcium: 8 mg/dL — ABNORMAL LOW (ref 8.4–10.5)
Chloride: 111 meq/L (ref 96–112)
Creatinine, Ser: 1.62 mg/dL — ABNORMAL HIGH (ref 0.50–1.10)
GFR calc Af Amer: 38 mL/min — ABNORMAL LOW
GFR calc non Af Amer: 33 mL/min — ABNORMAL LOW
Glucose, Bld: 131 mg/dL — ABNORMAL HIGH (ref 70–99)
Potassium: 4.3 meq/L (ref 3.7–5.3)
Sodium: 144 meq/L (ref 137–147)

## 2013-09-29 LAB — GLUCOSE, CAPILLARY
GLUCOSE-CAPILLARY: 122 mg/dL — AB (ref 70–99)
GLUCOSE-CAPILLARY: 150 mg/dL — AB (ref 70–99)
Glucose-Capillary: 118 mg/dL — ABNORMAL HIGH (ref 70–99)
Glucose-Capillary: 127 mg/dL — ABNORMAL HIGH (ref 70–99)
Glucose-Capillary: 129 mg/dL — ABNORMAL HIGH (ref 70–99)
Glucose-Capillary: 174 mg/dL — ABNORMAL HIGH (ref 70–99)

## 2013-09-29 MED ORDER — VITAL HIGH PROTEIN PO LIQD
1000.0000 mL | ORAL | Status: DC
  Administered 2013-09-29 – 2013-10-05 (×6): 1000 mL
  Filled 2013-09-29 (×10): qty 1000

## 2013-09-29 MED ORDER — HEPARIN SODIUM (PORCINE) 1000 UNIT/ML IJ SOLN
3000.0000 [IU] | Freq: Once | INTRAMUSCULAR | Status: AC
  Administered 2013-09-29: 2400 [IU] via INTRAVENOUS
  Filled 2013-09-29: qty 3

## 2013-09-29 MED ORDER — FUROSEMIDE 10 MG/ML IJ SOLN
40.0000 mg | Freq: Once | INTRAMUSCULAR | Status: AC
  Administered 2013-09-29: 40 mg via INTRAVENOUS
  Filled 2013-09-29: qty 4

## 2013-09-29 NOTE — Progress Notes (Signed)
Pt was placed back on full vent support by NP earlier d/t pt being sedated for procedure.  Plan to assess if pt can wean again later today.

## 2013-09-29 NOTE — Progress Notes (Signed)
NUTRITION FOLLOW UP  Intervention:    Change TF to Vital High Protein at 40 ml/h (960 ml per day) to provide 960 kcals, 84 gm protein, 1159 ml free water daily.  Above TF regimen plus current Propofol will provide a total of 1670 kcals per day.  Nutrition Dx:   Inadequate oral intake related to inability to eat as evidenced by NPO status, ongoing.  Goal:   Intake to meet >90% of estimated nutrition needs. Met.  Monitor:   TF tolerance/adequacy, weight trend, labs, vent status.  Assessment:   63 yo with three week history of choreiform movements presents 8/28 with worsening involuntary movements, metabolic acidosis, rhabdomyolysis and AKI.    Discussed patient in ICU rounds today. HD cath placed for plasma exchange today.   Patient is currently intubated on ventilator support MV: 7.8 L/min Temp (24hrs), Avg:99.7 F (37.6 C), Min:98.6 F (37 C), Max:100.7 F (38.2 C)  Propofol: 26.9 ml/hr providing 710 kcals/day   Height: Ht Readings from Last 1 Encounters:  09/17/13 $RemoveB'5\' 6"'SiQgcWQw$  (1.676 m)    Weight Status:   Wt Readings from Last 1 Encounters:  09/29/13 193 lb 5.5 oz (87.7 kg)  09/20/13 177 lb 14.6 oz (80.7 kg)  Re-estimated needs:  Kcal: 1732 Protein: 80-90 gm Fluid: 1.6-1.8 L  Skin: intact  Diet Order:  NPO   Intake/Output Summary (Last 24 hours) at 09/29/13 1614 Last data filed at 09/29/13 1600  Gross per 24 hour  Intake 3703.05 ml  Output   1845 ml  Net 1858.05 ml    Last BM: 9/10   Labs:   Recent Labs Lab 09/27/13 0428 09/28/13 0255 09/29/13 0400  NA 146 147 144  K 4.4 3.8 4.3  CL 114* 114* 111  CO2 $Re'21 23 24  'Jtm$ BUN 64* 51* 32*  CREATININE 1.96* 1.86* 1.62*  CALCIUM 8.0* 7.9* 8.0*  GLUCOSE 176* 76 131*    CBG (last 3)   Recent Labs  09/29/13 0742 09/29/13 1222 09/29/13 1532  GLUCAP 122* 118* 150*    Scheduled Meds: . antiseptic oral rinse  7 mL Mouth Rinse QID  . aspirin  81 mg Per Tube Daily  . chlorhexidine  15 mL Mouth Rinse  BID  . clonazePAM  1 mg Per Tube TID  . famotidine  20 mg Per Tube QHS  . free water  200 mL Per Tube Q4H  . heparin  5,000 Units Subcutaneous 3 times per day  . insulin aspart  0-9 Units Subcutaneous 6 times per day  . insulin glargine  20 Units Subcutaneous QHS  . tetrahydrozoline  2 drop Both Eyes BID  . thiamine  100 mg Per Tube Daily    Continuous Infusions: . feeding supplement (GLUCERNA 1.2 CAL) 1,000 mL (09/29/13 1528)  . propofol 50 mcg/kg/min (09/29/13 1418)    Molli Barrows, RD, LDN, [place] Pager [phone] After Hours Pager [phone]

## 2013-09-29 NOTE — Progress Notes (Signed)
Patient ID: ARCENIA SCARBRO, female   DOB: January 11, 1951, 63 y.o.   MRN: [id number]     [facility]      [address] [place], [place] [address]    Phone: [phone] FAX: [phone]     Subjective: Remains intubated.  Febrile with increasing WBC.   Objective:  Vital signs:  Filed Vitals:   09/29/13 0600 09/29/13 0700 09/29/13 0742 09/29/13 0755  BP: 133/99 178/75  158/70  Pulse: 95 94  88  Temp:   100.7 F (38.2 C)   TempSrc:   Oral   Resp: _0 Height:      Weight:      SpO2: 100% 100%  100%    Last BM Date: 09/28/13  Intake/Output   Yesterday:  09/09 0701 - 09/10 0700 In: 2709.9 [I.V.:1074.9; NG/GT:1635] Out: 1875 [Urine:1875] This shift:   Physical Exam: Skin: no ecchymosis or palpable masses, band aid in place, no bleeding.    Problem List:   Active Problems:   HYPERLIPIDEMIA   HYPERTENSION   Type 2 diabetes mellitus without complication   Involuntary movements   CKD (chronic kidney disease), stage III   Anemia   Rhabdomyolysis   Sepsis   Acute respiratory failure   Chorea   Acute respiratory failure with hypoxia   Agitation states as acute reaction to exceptional (gross) stress   Respiratory arrest    Results:   Labs: Results for orders placed during the hospital encounter of 09/05/2013 (from the past 48 hour(s))  GLUCOSE, CAPILLARY     Status: Abnormal   Collection Time    09/27/13 12:25 PM      Result Value Ref Range   Glucose-Capillary 106 (*) 70 - 99 mg/dL  GLUCOSE, CAPILLARY     Status: Abnormal   Collection Time    09/27/13  3:39 PM      Result Value Ref Range   Glucose-Capillary 106 (*) 70 - 99 mg/dL  VITAMIN B12     Status: None   Collection Time    09/27/13  4:00 PM      Result Value Ref Range   Vitamin B-12 368  211 - 911 pg/mL   Comment: Performed at [facility]  CSF CELL COUNT WITH DIFFERENTIAL     Status: None   Collection Time    09/27/13  4:41 PM       Result Value Ref Range   Tube # 1     Color, CSF COLORLESS  COLORLESS   Appearance, CSF CLEAR  CLEAR   Supernatant NOT INDICATED     RBC Count, CSF 0  0 /cu mm   WBC, CSF 0  0 - 5 /cu mm   Segmented Neutrophils-CSF NONE SEEN  0 - 6 %   Lymphs, CSF FEW  40 - 80 %   Monocyte-Macrophage-Spinal Fluid RARE  15 - 45 %   Eosinophils, CSF NONE SEEN  0 - 1 %  PROTEIN AND GLUCOSE, CSF     Status: Abnormal   Collection Time    09/27/13  4:41 PM      Result Value Ref Range   Glucose, CSF 91 (*) 43 - 76 mg/dL   Total  Protein, CSF 12 (*) 15 - 45 mg/dL  ANTIPHOSPHOLIPID SYNDROME EVAL, BLD     Status: Abnormal (Preliminary result)   Collection Time    09/27/13  5:30 PM      Result Value Ref Range  Anticardiolipin IgA 7 (*) <22 APL U/mL   Comment: (NOTE)     Reference Range:  Cardiolipin IgG       Normal                  <23       Low Positive (+)        23-35       Moderate Positive (+)   36-50       High Positive (+)       >50     Reference Range:  Cardiolipin IgM       Normal                  <11       Low Positive (+)        11-20       Moderate Positive (+)   21-30       High Positive (+)       >30     Reference Range:  Cardiolipin IgA       Normal                  <22       Low Positive (+)        22-35       Moderate Positive (+)   36-45       High Positive (+)       >45   Anticardiolipin IgG 6 (*) <23 GPL U/mL   Anticardiolipin IgM 3 (*) <11 MPL U/mL   PTT Lupus Anticoagulant 38.7  28.0 - 43.0 secs   PTTLA Confirmation NOT APPL  <8.0 secs   PTTLA 4:1 Mix NOT APPL  28.0 - 43.0 secs   Drvvt 43.9 (*) <42.9 secs   Drvvt confirmation NOT APPL  <1.15 Ratio   dRVVT Incubated 1:1 Mix 39.7  <42.9 secs   Lupus Anticoagulant NOT DETECTED  NOT DETECTED   Comment: Performed at [facility]   Phosphatydalserine, IgG PENDING     Phosphatydalserine, IgM PENDING     Phosphatydalserine, IgA PENDING    GLUCOSE, CAPILLARY     Status: Abnormal   Collection Time    09/27/13  7:46 PM       Result Value Ref Range   Glucose-Capillary 135 (*) 70 - 99 mg/dL  GLUCOSE, CAPILLARY     Status: Abnormal   Collection Time    09/28/13 12:08 AM      Result Value Ref Range   Glucose-Capillary 106 (*) 70 - 99 mg/dL   Comment 1 Documented in Chart     Comment 2 Notify RN    CBC     Status: Abnormal   Collection Time    09/28/13  2:55 AM      Result Value Ref Range   WBC 11.7 (*) 4.0 - 10.5 K/uL   RBC 2.84 (*) 3.87 - 5.11 MIL/uL   Hemoglobin 7.9 (*) 12.0 - 15.0 g/dL   HCT 26.4 (*) 36.0 - 46.0 %   MCV 93.0  78.0 - 100.0 fL   MCH 27.8  26.0 - 34.0 pg   MCHC 29.9 (*) 30.0 - 36.0 g/dL   RDW 17.0 (*) 11.5 - 15.5 %   Platelets 289  150 - 400 K/uL  COMPREHENSIVE METABOLIC PANEL     Status: Abnormal   Collection Time    09/28/13  2:55 AM      Result Value Ref Range   Sodium 147  137 -  147 mEq/L   Potassium 3.8  3.7 - 5.3 mEq/L   Chloride 114 (*) 96 - 112 mEq/L   CO2 23  19 - 32 mEq/L   Glucose, Bld 76  70 - 99 mg/dL   BUN 51 (*) 6 - 23 mg/dL   Creatinine, Ser 1.86 (*) 0.50 - 1.10 mg/dL   Calcium 7.9 (*) 8.4 - 10.5 mg/dL   Total Protein 4.6 (*) 6.0 - 8.3 g/dL   Albumin 1.6 (*) 3.5 - 5.2 g/dL   AST 21  0 - 37 U/L   ALT 81 (*) 0 - 35 U/L   Alkaline Phosphatase 218 (*) 39 - 117 U/L   Total Bilirubin 0.2 (*) 0.3 - 1.2 mg/dL   GFR calc non Af Amer 28 (*) >90 mL/min   GFR calc Af Amer 32 (*) >90 mL/min   Comment: (NOTE)     The eGFR has been calculated using the CKD EPI equation.     This calculation has not been validated in all clinical situations.     eGFR's persistently <90 mL/min signify possible Chronic Kidney     Disease.   Anion gap 10  5 - 15  TRIGLYCERIDES     Status: Abnormal   Collection Time    09/28/13  2:55 AM      Result Value Ref Range   Triglycerides 236 (*) <150 mg/dL  GLUCOSE, CAPILLARY     Status: Abnormal   Collection Time    09/28/13  3:42 AM      Result Value Ref Range   Glucose-Capillary 67 (*) 70 - 99 mg/dL   Comment 1 Documented in Chart      Comment 2 Notify RN    GLUCOSE, CAPILLARY     Status: Abnormal   Collection Time    09/28/13  4:37 AM      Result Value Ref Range   Glucose-Capillary 104 (*) 70 - 99 mg/dL   Comment 1 Documented in Chart     Comment 2 Notify RN    GLUCOSE, CAPILLARY     Status: Abnormal   Collection Time    09/28/13  7:51 AM      Result Value Ref Range   Glucose-Capillary 55 (*) 70 - 99 mg/dL   Comment 1 Notify RN     Comment 2 Documented in Chart    GLUCOSE, CAPILLARY     Status: Abnormal   Collection Time    09/28/13  8:53 AM      Result Value Ref Range   Glucose-Capillary 113 (*) 70 - 99 mg/dL  GLUCOSE, CAPILLARY     Status: Abnormal   Collection Time    09/28/13 12:59 PM      Result Value Ref Range   Glucose-Capillary 50 (*) 70 - 99 mg/dL  GLUCOSE, CAPILLARY     Status: Abnormal   Collection Time    09/28/13  1:21 PM      Result Value Ref Range   Glucose-Capillary 134 (*) 70 - 99 mg/dL  GLUCOSE, CAPILLARY     Status: None   Collection Time    09/28/13  3:30 PM      Result Value Ref Range   Glucose-Capillary 86  70 - 99 mg/dL  GLUCOSE, CAPILLARY     Status: None   Collection Time    09/28/13  7:09 PM      Result Value Ref Range   Glucose-Capillary 81  70 - 99 mg/dL  GLUCOSE, CAPILLARY  Status: Abnormal   Collection Time    09/28/13 11:55 PM      Result Value Ref Range   Glucose-Capillary 127 (*) 70 - 99 mg/dL   Comment 1 Documented in Chart     Comment 2 Notify RN    BASIC METABOLIC PANEL     Status: Abnormal   Collection Time    09/29/13  4:00 AM      Result Value Ref Range   Sodium 144  137 - 147 mEq/L   Potassium 4.3  3.7 - 5.3 mEq/L   Chloride 111  96 - 112 mEq/L   CO2 24  19 - 32 mEq/L   Glucose, Bld 131 (*) 70 - 99 mg/dL   BUN 32 (*) 6 - 23 mg/dL   Comment: DELTA CHECK NOTED   Creatinine, Ser 1.62 (*) 0.50 - 1.10 mg/dL   Calcium 8.0 (*) 8.4 - 10.5 mg/dL   GFR calc non Af Amer 33 (*) >90 mL/min   GFR calc Af Amer 38 (*) >90 mL/min   Comment: (NOTE)     The  eGFR has been calculated using the CKD EPI equation.     This calculation has not been validated in all clinical situations.     eGFR's persistently <90 mL/min signify possible Chronic Kidney     Disease.   Anion gap 9  5 - 15  CBC     Status: Abnormal   Collection Time    09/29/13  4:00 AM      Result Value Ref Range   WBC 13.0 (*) 4.0 - 10.5 K/uL   RBC 2.91 (*) 3.87 - 5.11 MIL/uL   Hemoglobin 8.0 (*) 12.0 - 15.0 g/dL   HCT 26.9 (*) 36.0 - 46.0 %   MCV 92.4  78.0 - 100.0 fL   MCH 27.5  26.0 - 34.0 pg   MCHC 29.7 (*) 30.0 - 36.0 g/dL   RDW 17.2 (*) 11.5 - 15.5 %   Platelets 281  150 - 400 K/uL  GLUCOSE, CAPILLARY     Status: Abnormal   Collection Time    09/29/13  4:10 AM      Result Value Ref Range   Glucose-Capillary 129 (*) 70 - 99 mg/dL  GLUCOSE, CAPILLARY     Status: Abnormal   Collection Time    09/29/13  7:42 AM      Result Value Ref Range   Glucose-Capillary 122 (*) 70 - 99 mg/dL    Imaging / Studies: [place] Core Biopsy  09/28/2013   CLINICAL DATA:  63 year old with a right breast nodule. 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  EXAM: ULTRASOUND GUIDED BREAST CORE NEEDLE BIOPSY (RIGHT )  Physician: Stephan Minister. Anselm Pancoast, MD  FLUOROSCOPY TIME:  None  MEDICATIONS: None  ANESTHESIA/SEDATION: Moderate sedation time: None  PROCEDURE: Informed consent was obtained for a right breast nodule biopsy. An irregular shaped nodule in the right breast was identified at the 12 o'clock position. This corresponds with the recent diagnostic ultrasound. The breast was prepped with Betadine and a sterile field was created. 1% lidocaine is used for local anesthetic. A total of 5 core biopsies were obtained with an 18 gauge device using ultrasound guidance. Needle position was confirmed within the lesion. Bandage was placed at the puncture site. Specimens placed in formalin.  FINDINGS: Hypoechoic nodule in the right breast at the 12 o'clock position. Findings correspond with the recent diagnostic ultrasound.   COMPLICATIONS: None  IMPRESSION: Ultrasound-guided core biopsies of the right breast nodule.  Electronically Signed   By: Markus Daft M.D.   On: 09/28/2013 17:59   Dg Chest Port 1 View  09/29/2013   CLINICAL DATA:  Followup respiratory failure  EXAM: PORTABLE CHEST - 1 VIEW  COMPARISON:  Prior chest x-ray 09/28/2013  FINDINGS: The endotracheal tube is 3 cm above the carina. A left IJ approach central venous catheter is in place with the tip directed laterally and toward the wall of the upper SVC. An enteric feeding tube is present. The tip of the tube lies below the diaphragm off the field of view.  Improved inspiratory volumes with decreased bibasilar opacities. There is persistent atelectasis. No pulmonary edema. No pneumothorax. The patient remains rotated to the right. Cardiac and mediastinal contours are grossly within normal limits given the rotated positioning.  IMPRESSION: 1. Improved aeration secondary to improved pulmonary edema and decreased bibasilar atelectasis. 2. Stable positioning of support apparatus.   Electronically Signed   By: Jacqulynn Cadet M.D.   On: 09/29/2013 07:38   Dg Chest Port 1 View  09/28/2013   CLINICAL DATA:  Respiratory failure.  EXAM: PORTABLE CHEST - 1 VIEW  COMPARISON:  09/27/2013.  CT 09/21/2013.  FINDINGS: Endotracheal tube, orogastric tube, left IJ line in stable position. Heart size stable, no pulmonary venous congestion. Persistent bilateral airspace disease in basilar atelectasis. Small pleural effusions cannot be excluded. No pneumothorax. No acute osseous abnormality  IMPRESSION: 1. Line and tube position stable. 2. Persistent bilateral airspace disease in basilar atelectasis, no change. 3. Small bilateral pleural effusions cannot be excluded .   Electronically Signed   By: Marcello Moores  Register   On: 09/28/2013 07:42    Medications / Allergies:  Scheduled Meds: . antiseptic oral rinse  7 mL Mouth Rinse QID  . aspirin  81 mg Per Tube Daily  . chlorhexidine  15  mL Mouth Rinse BID  . clonazePAM  1 mg Per Tube TID  . famotidine  20 mg Per Tube QHS  . free water  200 mL Per Tube Q4H  . heparin  5,000 Units Subcutaneous 3 times per day  . insulin aspart  0-9 Units Subcutaneous 6 times per day  . insulin glargine  20 Units Subcutaneous QHS  . tetrahydrozoline  2 drop Both Eyes BID  . thiamine  100 mg Per Tube Daily   Continuous Infusions: . feeding supplement (GLUCERNA 1.2 CAL) 1,000 mL (09/28/13 1835)  . propofol 45 mcg/kg/min (09/29/13 0656)   PRN Meds:.fentaNYL, hydrALAZINE, midazolam  Antibiotics: Anti-infectives   Start     Dose/Rate Route Frequency Ordered Stop   09/18/13 0100  vancomycin (VANCOCIN) IVPB 1000 mg/200 mL premix  Status:  Discontinued     1,000 mg 200 mL/hr over 60 Minutes Intravenous Every 24 hours 09/17/13 1013 09/18/13 0919   09/17/13 0600  piperacillin-tazobactam (ZOSYN) IVPB 3.375 g  Status:  Discontinued     3.375 g 12.5 mL/hr over 240 Minutes Intravenous 3 times per day 09/17/13 0106 09/18/13 0919   09/17/13 0115  piperacillin-tazobactam (ZOSYN) IVPB 3.375 g  Status:  Discontinued     3.375 g 100 mL/hr over 30 Minutes Intravenous STAT 09/17/13 0105 09/17/13 0105   09/17/13 0115  vancomycin (VANCOCIN) 1,500 mg in sodium chloride 0.9 % 500 mL IVPB     1,500 mg 250 mL/hr over 120 Minutes Intravenous STAT 09/17/13 0105 09/17/13 0339   09/17/13 0030  piperacillin-tazobactam (ZOSYN) IVPB 3.375 g     3.375 g 100 mL/hr over 30 Minutes Intravenous  Once 09/17/13 0023  09/17/13 0255   09/17/13 0030  vancomycin (VANCOCIN) IVPB 1000 mg/200 mL premix  Status:  Discontinued     1,000 mg 200 mL/hr over 60 Minutes Intravenous  Once 09/17/13 0023 09/17/13 0106      Assessment/Plan Right breast mass S/p core needle biopsy by IR 9/9 -will await results to determine if there is a role for surgery.  Erby Pian, [facility] Pager [phone]) For consults and floor pages call  [phone](7A-4:30P)  09/29/2013 10:08 AM

## 2013-09-29 NOTE — Progress Notes (Signed)
UR Completed.  Vergie Living [phone] 09/29/2013

## 2013-09-29 NOTE — Progress Notes (Signed)
I have seen and examined the patient and agree with the assessment and plans.  Emry Tobin A. Bevelyn Arriola  MD, FACS  

## 2013-09-29 NOTE — Progress Notes (Signed)
PULMONARY / CRITICAL CARE MEDICINE  Name: [name] MRN: [id number] DOB: 05-Feb-1950    ADMISSION DATE:  09/12/2013 CONSULTATION DATE:  08/24/2013  REFERRING MD :  Thereasa Solo   CHIEF COMPLAINT:  Concern for airway protection   INITIAL PRESENTATION: 63 yo with three week history of choreiform movements presents 8/28 with worsening involuntary movements, metabolic acidosis, rhabdomyolysis  and AKI.   STUDIES / EVENTS:  8/28  Admitted 8/31  MRI brain: bilateral basal ganglial altered signal intensity of uncertain etiology 6/29  Intubated in MRI secondary to oversedation, extubated upon arrival to ICU.  Re-intubated for "severe chorea and hypoxemic resp failure" 8/31  TTE: LVEF 45%, grade 2 DD 9/02  CT chest / abdomen / pelvis: 1.2 cm L breast mass, small bilateral effusions / atelectasis, atrophic R kidney with bilateral cysts 9/03 [place] R breast: Suspicious right breast mass 9/04 Extubated 9/05 Updated family in detail about findings and care plan 9/07 Was conversant with RN, then sudden onset of central apnea and junctional bradycardia. HR responded to atropine. Intubated for third time this hospitalization 9/07 CT head: No acute change 9/07 EEG: no epileptiform discharges 9/08 CCS consult for possible breast mass bx. Not palpable - therefore, US guided bx by IR requested.  9/09 Core needle bx of R breast lesion by IR 9/10 HD cath placed. Plasma exchange to be started by Neuro  LINES/TUBES: ETT 8/31 >> 8/31, 8/31 >> 9/04, 9/07 >>  L IJ CVL 8/31 >>  R IJ HD cath 9/10 >>   MICRO: Urine 8/28 >> NEG Blood 8/29 >> NEG  SUBJ:  Sedated on propofol. When dose cut in half, choreiform movement began to recur  VITAL SIGNS: Temp:  [98.6 F (37 C)-100.7 F (38.2 C)] 100.1 F (37.8 C) (09/10 1200) Pulse Rate:  [84-98] 91 (09/10 1200) Resp:  [15-23] 20 (09/10 1200) BP: (119-183)/(45-99) 163/74 mmHg (09/10 1200) SpO2:  [100 %] 100 % (09/10 1200) FiO2 (%):  [40 %] 40 % (09/10  1200) Weight:  [87.7 kg (193 lb 5.5 oz)] 87.7 kg (193 lb 5.5 oz) (09/10 0417)  HEMODYNAMICS:   VENTILATOR SETTINGS: Vent Mode:  [-] PRVC FiO2 (%):  [40 %] 40 % Set Rate:  [16 bmp] 16 bmp Vt Set:  [480 mL] 480 mL PEEP:  [5 cmH20] 5 cmH20 Pressure Support:  [15 cmH20] 15 cmH20 Plateau Pressure:  [15 cmH20-18 cmH20] 18 cmH20 INTAKE / OUTPUT: Intake/Output     09/09 0701 - 09/10 0700 09/10 0701 - 09/11 0700   I.V. (mL/kg) 1074.9 (12.3) 124.6 (1.4)   Other 10 60   NG/GT 1635 960   IV Piggyback     Total Intake(mL/kg) 2719.9 (31) 1144.6 (13.1)   Urine (mL/kg/hr) 1875 (0.9) 350 (0.5)   Total Output 1875 350   Net +844.9 +794.6        Urine Occurrence 1 x     PHYSICAL EXAMINATION: General: RASS -2, not F/C Neuro: MAEs. Cognition cannot be tested  HEENT: PERRL, EOMI Cardiovascular: Regular, no murmurs Lungs: Clear anteriorly Abdomen:  Soft, bowel sounds present Ext: Trace edema, warm  LABS: I have reviewed all of today's lab results. 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 Baseline Cr unknown Mild hypernatremia, resolved Mild metabolic acidosis, resolved P:   Monitor BMET intermittently Monitor I/Os Correct electrolytes as indicated Cont free water  GASTROINTESTINAL A:   Dysphagia P:   SUP: famotidine Cont TFs  HEMATOLOGIC / ONC A:   R breast mass Anemia P:  DVT px: SQ heparin Monitor CBC intermittently Transfuse per usual ICU guidelines F/U path  INFECTIOUS A:   Leukocytosis, resolved P:   Following  off abx  ENDOCRINE A:   DM 2 Severe hyperglycemia improved off steroids P:   Decrease Lantus 9/09 Cont SSI  NEUROLOGIC A:   Chorea of unclear etiology Acute encephalopathy  P:   Further eval and mgmt per Neurology Cont propofol gtt Cont PRN fentanyl  Cont PRN midaz PLEX to begin 9/10  I have personally obtained history, examined patient, evaluated and interpreted laboratory and imaging results, reviewed medical records, formulated assessment / plan and placed orders.  35 mins CCM time. Husband updated @ bedside. Discussed with Dr Barnie Del, MD Pulmonary and [place] Pager: [phone]  09/29/2013, 2:53 PM

## 2013-09-29 NOTE — Procedures (Signed)
Hemodialysis Insertion Procedure Note EVAMARIE RAETZ [id number] 1950/11/14  Procedure: Insertion of Hemodialysis Catheter Type: 3 port  Indications: Hemodialysis   Procedure Details Consent: Risks of procedure as well as the alternatives and risks of each were explained to the (patient/caregiver).  Consent for procedure obtained. Time Out: Verified patient identification, verified procedure, site/side was marked, verified correct patient position, special equipment/implants available, medications/allergies/relevent history reviewed, required imaging and test results available.  Performed  Maximum sterile technique was used including antiseptics, cap, gloves, gown, hand hygiene, mask and sheet. Skin prep: Chlorhexidine; local anesthetic administered A antimicrobial bonded/coated triple lumen catheter was placed in the right internal jugular vein using the Seldinger technique. Ultrasound guidance used.Yes.   Catheter placed to 15 cm. Blood aspirated via all 3 ports and then flushed x 3. Line sutured x 2 and dressing applied.  Evaluation Blood flow good Complications: No apparent complications Patient did tolerate procedure well. Chest X-ray ordered to verify placement.  CXR: well positioned, no ptx  Inserted By: Darlina Sicilian NP-C  Georgann Housekeeper, ACNP [place] [facility] Pager [phone] or [phone]   I was present for and supervised the entire procedure  Merton Border, MD ; [facility] service Mobile [phone].  After 5:30 PM or weekends, call [phone]

## 2013-09-30 LAB — BASIC METABOLIC PANEL
ANION GAP: 10 (ref 5–15)
BUN: 23 mg/dL (ref 6–23)
CALCIUM: 7.8 mg/dL — AB (ref 8.4–10.5)
CO2: 26 mEq/L (ref 19–32)
Chloride: 108 mEq/L (ref 96–112)
Creatinine, Ser: 1.49 mg/dL — ABNORMAL HIGH (ref 0.50–1.10)
GFR, EST AFRICAN AMERICAN: 42 mL/min — AB (ref >90)
GFR, EST NON AFRICAN AMERICAN: 36 mL/min — AB (ref >90)
Glucose, Bld: 160 mg/dL — ABNORMAL HIGH (ref 70–99)
Potassium: 4.3 mEq/L (ref 3.7–5.3)
SODIUM: 144 meq/L (ref 137–147)

## 2013-09-30 LAB — POCT I-STAT, CHEM 8
BUN: 20 mg/dL (ref 6–23)
Calcium, Ion: 1.17 mmol/L (ref 1.13–1.30)
Chloride: 107 mEq/L (ref 96–112)
Creatinine, Ser: 1.7 mg/dL — ABNORMAL HIGH (ref 0.50–1.10)
Glucose, Bld: 147 mg/dL — ABNORMAL HIGH (ref 70–99)
HCT: 24 % — ABNORMAL LOW (ref 36.0–46.0)
Hemoglobin: 8.2 g/dL — ABNORMAL LOW (ref 12.0–15.0)
Potassium: 4 mEq/L (ref 3.7–5.3)
SODIUM: 141 meq/L (ref 137–147)
TCO2: 25 mmol/L (ref 0–100)

## 2013-09-30 LAB — CSF IGG: IgG, CSF: 0.8 mg/dL (ref 0.8–7.7)

## 2013-09-30 LAB — GLUCOSE, CAPILLARY
GLUCOSE-CAPILLARY: 184 mg/dL — AB (ref 70–99)
GLUCOSE-CAPILLARY: 185 mg/dL — AB (ref 70–99)
Glucose-Capillary: 153 mg/dL — ABNORMAL HIGH (ref 70–99)
Glucose-Capillary: 130 mg/dL — ABNORMAL HIGH (ref 70–99)
Glucose-Capillary: 137 mg/dL — ABNORMAL HIGH (ref 70–99)
Glucose-Capillary: 151 mg/dL — ABNORMAL HIGH (ref 70–99)

## 2013-09-30 LAB — PROCALCITONIN: PROCALCITONIN: 0.43 ng/mL

## 2013-09-30 LAB — ATHENA MAIL

## 2013-09-30 LAB — CK: Total CK: 115 U/L (ref 7–177)

## 2013-09-30 MED ORDER — ACD FORMULA A 0.73-2.45-2.2 GM/100ML VI SOLN
Status: AC
  Administered 2013-09-30: 500 mL via INTRAVENOUS
  Filled 2013-09-30: qty 500

## 2013-09-30 MED ORDER — PIPERACILLIN-TAZOBACTAM 3.375 G IVPB
3.3750 g | Freq: Three times a day (TID) | INTRAVENOUS | Status: DC
  Administered 2013-09-30 – 2013-10-02 (×6): 3.375 g via INTRAVENOUS
  Filled 2013-09-30 (×7): qty 50

## 2013-09-30 MED ORDER — DIPHENHYDRAMINE HCL 25 MG PO CAPS
25.0000 mg | ORAL_CAPSULE | Freq: Four times a day (QID) | ORAL | Status: DC | PRN
Start: 2013-09-30 — End: 2013-09-30

## 2013-09-30 MED ORDER — ACETAMINOPHEN 325 MG PO TABS: 650.0000 mg | ORAL_TABLET | ORAL | Status: DC | PRN

## 2013-09-30 MED ORDER — ANTICOAGULANT SODIUM CITRATE 4% (200MG/5ML) IV SOLN
5.0000 mL | Freq: Once | Status: DC
  Administered 2013-09-30: 2.4 mL
  Filled 2013-09-30 (×2): qty 250

## 2013-09-30 MED ORDER — SODIUM CHLORIDE 0.9 % IJ SOLN
10.0000 mL | INTRAMUSCULAR | Status: DC | PRN
  Administered 2013-09-30: 10 mL

## 2013-09-30 MED ORDER — DIPHENHYDRAMINE HCL 12.5 MG/5ML PO ELIX
25.0000 mg | ORAL_SOLUTION | Freq: Four times a day (QID) | ORAL | Status: DC | PRN
  Administered 2013-09-30: 25 mg
  Filled 2013-09-30: qty 10

## 2013-09-30 MED ORDER — VANCOMYCIN HCL 10 G IV SOLR
1500.0000 mg | Freq: Once | INTRAVENOUS | Status: AC
  Administered 2013-09-30: 1500 mg via INTRAVENOUS
  Filled 2013-09-30: qty 1500

## 2013-09-30 MED ORDER — ACD FORMULA A 0.73-2.45-2.2 GM/100ML VI SOLN
500.0000 mL | Status: DC
  Administered 2013-09-30: 500 mL via INTRAVENOUS
  Filled 2013-09-30: qty 500

## 2013-09-30 MED ORDER — PIPERACILLIN-TAZOBACTAM 3.375 G IVPB 30 MIN
3.3750 g | Freq: Once | INTRAVENOUS | Status: AC
  Administered 2013-09-30: 3.375 g via INTRAVENOUS
  Filled 2013-09-30: qty 50

## 2013-09-30 MED ORDER — SODIUM CHLORIDE 0.9 % IV SOLN
4.0000 g | Freq: Once | INTRAVENOUS | Status: DC
  Administered 2013-09-30: 4 g via INTRAVENOUS
  Filled 2013-09-30 (×2): qty 40

## 2013-09-30 MED ORDER — SODIUM CHLORIDE 0.9 % IJ SOLN
10.0000 mL | Freq: Two times a day (BID) | INTRAMUSCULAR | Status: DC
  Administered 2013-09-30: 10 mL
  Administered 2013-10-01: 30 mL
  Administered 2013-10-01 – 2013-10-05 (×8): 10 mL

## 2013-09-30 MED ORDER — CALCIUM CARBONATE ANTACID 500 MG PO CHEW
2.0000 | CHEWABLE_TABLET | ORAL | Status: DC
  Filled 2013-09-30 (×3): qty 2

## 2013-09-30 MED ORDER — VANCOMYCIN HCL IN DEXTROSE 750-5 MG/150ML-% IV SOLN
750.0000 mg | Freq: Two times a day (BID) | INTRAVENOUS | Status: DC
Start: 2013-09-30 — End: 2013-10-02
  Administered 2013-09-30 – 2013-10-01 (×3): 750 mg via INTRAVENOUS
  Filled 2013-09-30 (×5): qty 150

## 2013-09-30 NOTE — Progress Notes (Signed)
PULMONARY / CRITICAL CARE MEDICINE  Name: [name] MRN: [id number] DOB: [birth year]    ADMISSION DATE:  08/23/2013 CONSULTATION DATE:  09/15/2013  REFERRING MD :  Thereasa Solo   CHIEF COMPLAINT:  Concern for airway protection   INITIAL PRESENTATION: 63 yo with three week history of choreiform movements presents 8/28 with worsening involuntary movements, metabolic acidosis, rhabdomyolysis  and AKI.   STUDIES / EVENTS:  8/28  Admitted 8/31  MRI brain: bilateral basal ganglial altered signal intensity of uncertain etiology 0/08  Intubated in MRI secondary to oversedation, extubated upon arrival to ICU.  Re-intubated for "severe chorea and hypoxemic resp failure" 8/31  TTE: LVEF 45%, grade 2 DD 9/02  CT chest / abdomen / pelvis: 1.2 cm L breast mass, small bilateral effusions / atelectasis, atrophic R kidney with bilateral cysts 9/03 [place] R breast: Suspicious right breast mass 9/04 Extubated 9/05 Updated family in detail about findings and care plan 9/07 Was conversant with RN, then sudden onset of central apnea and junctional bradycardia. HR responded to atropine. Intubated for third time this hospitalization 9/07 CT head: No acute change 9/07 EEG: no epileptiform discharges 9/08 CCS consult for possible breast mass bx. Not palpable - therefore, US guided bx by IR requested.  9/09 Core needle bx of R breast lesion by IR 9/10 HD cath placed for TPE (therapeutic plasma exchange) 9/11 TPE initiated. Plan QOD for 5 treatments 9/11 Fever, purulent secretions. CXR from 9/10 c/w possible PNA. Cultures obtained. abx initiated  LINES/TUBES: ETT 8/31 >> 8/31, 8/31 >> 9/04, 9/07 >>  L IJ CVL 8/31 >> 9/11 R IJ HD cath 9/10 >>  PICC (ordered) 9/11 >>   MICRO: Urine 8/28 >> NEG Blood 8/29 >> NEG Blood 9/11 >>  Resp 9/11 >>   PCT 9/11: 0.43  9/12:     9/13:   SUBJ:  Sedated on propofol. When dose cut in half, choreiform movement recurred  VITAL SIGNS: Temp:  [98.5 F (36.9 C)-101 F  (38.3 C)] 99.4 F (37.4 C) (09/11 1416) Pulse Rate:  [80-106] 86 (09/11 1416) Resp:  [15-27] 18 (09/11 1416) BP: (113-178)/(45-97) 126/49 mmHg (09/11 1416) SpO2:  [98 %-100 %] 100 % (09/11 1400) FiO2 (%):  [40 %] 40 % (09/11 1400) Weight:  [87.6 kg (193 lb 2 oz)] 87.6 kg (193 lb 2 oz) (09/11 0500)  HEMODYNAMICS:   VENTILATOR SETTINGS: Vent Mode:  [-] CPAP;PSV FiO2 (%):  [40 %] 40 % Set Rate:  [16 bmp] 16 bmp Vt Set:  [480 mL] 480 mL PEEP:  [5 cmH20] 5 cmH20 Pressure Support:  [10 cmH20-15 cmH20] 15 cmH20 Plateau Pressure:  [17 cmH20-22 cmH20] 18 cmH20 INTAKE / OUTPUT: Intake/Output     09/10 0701 - 09/11 0700 09/11 0701 - 09/12 0700   I.V. (mL/kg) 608.8 (6.9) 161.4 (1.8)   Other 240 60   NG/GT 2690 710   Total Intake(mL/kg) 3538.8 (40.4) 931.4 (10.6)   Urine (mL/kg/hr) 3670 (1.7) 635 (1)   Total Output 3670 635   Net -131.3 +296.4         PHYSICAL EXAMINATION: General: RASS -2, not F/C Neuro: MAEs. Cognition cannot be tested  HEENT: PERRL, EOMI Cardiovascular: Regular, no murmurs Lungs: scattered rhonchi Abdomen:  Soft, bowel sounds present Ext: Trace symmetric edema, warm  LABS: I have reviewed all of today's lab results. 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 Baseline Cr unknown Mild hypernatremia, resolved Mild metabolic acidosis, resolved P:   Monitor BMET intermittently Monitor I/Os Correct electrolytes as indicated Cont free water  GASTROINTESTINAL A:   Dysphagia P:   SUP: famotidine Cont  TFs Probably will warrant SLP eval post extubation  HEMATOLOGIC / ONC A:   R breast mass Anemia P:  DVT px: SQ heparin Monitor CBC intermittently Transfuse per usual ICU guidelines F/U path from core needle bx 9/10  INFECTIOUS A:   Fever, recurrent leukocytosis Probable HCAP P:   Micro and abx as above  ENDOCRINE A:   DM 2, controlled Severe hyperglycemia while on systemic steroids, resolved P:   Cont Lantus Cont SSI  NEUROLOGIC A:   Chorea of unclear etiology Acute encephalopathy  P:   Further eval and mgmt per Neurology Cont propofol gtt Cont PRN fentanyl  Cont PRN midaz TPE to begin 9/11  I have personally obtained history, examined patient, evaluated and interpreted laboratory and imaging results, reviewed medical records, formulated assessment / plan and placed orders.  40 mins CCM time.    Merton Border, MD Pulmonary and [place] Pager: [phone]  09/30/2013, 2:22 PM

## 2013-09-30 NOTE — Progress Notes (Signed)
Patient ID: [name], female   DOB: 01-Mar-1950, 63 y.o.   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  Will see prn

## 2013-09-30 NOTE — Progress Notes (Signed)
Following at a distance since 09/26/13. She remains intubated at this time with plans for plasmapheresis to be initiated today. Will sign off for now and recommend  Re-consult once patient extubated and therapies initiated.

## 2013-09-30 NOTE — Progress Notes (Signed)
Called to bedside by RN d/t pt sedated for procedure, vent alarming low min volume.  Pt placed back on full vent support.

## 2013-09-30 NOTE — Progress Notes (Signed)
NEURO HOSPITALIST PROGRESS NOTE   SUBJECTIVE:                                                                                                                        Neurologically unchanged. Choreiform movements reemerge as soon as dose of propofol is lowered. Paraneoplastic/autoimmune CSF studies pending. PLEX to be started today.   OBJECTIVE:                                                                                                                           Vital signs in last 24 hours: Temp:  [98.5 F (36.9 C)-101 F (38.3 C)] 98.5 F (36.9 C) (09/11 0824) Pulse Rate:  [80-106] 83 (09/11 0800) Resp:  [15-27] 17 (09/11 0800) BP: (113-178)/(49-97) 147/61 mmHg (09/11 0800) SpO2:  [98 %-100 %] 100 % (09/11 0800) FiO2 (%):  [40 %] 40 % (09/11 0800) Weight:  [87.6 kg (193 lb 2 oz)] 87.6 kg (193 lb 2 oz) (09/11 0500)  Intake/Output from previous day: 09/10 0701 - 09/11 0700 In: 3538.8 [I.V.:608.8; NG/GT:2690] Out: 3570 [Urine:3570] Intake/Output this shift:   Nutritional status:    Past Medical History  Diagnosis Date  . Hypertension   . Diabetes mellitus   . Hyperlipidemia   . History of noncompliance with medical treatment   . Shingles 08/18/2013   Neurologic Exam:  Propofol turn off. Mental status: unresponsive to verbal commands but then exhibits spontaneous choreiform movements. CN 2-12: pupils 1 mm, reactive. No gaze preference. Doesn't blink to threat. Tongue: intubated Motor: choreiform movements involving head, arms, legs. Sensory: reacts to pain. DTR's: 1 all over. Plantars: downgoing. Coordination and gait: unable to test.  Lab Results: Lab Results  Component Value Date/Time   CHOL 287* 08/18/2013  2:44 AM   Lipid Panel  Recent Labs  October 05, 2013 0255  TRIG 236*    Studies/Results: [place] Core Biopsy  10/10/2013   CLINICAL DATA:  63 year old with a right breast nodule. 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  EXAM:  ULTRASOUND GUIDED BREAST CORE NEEDLE BIOPSY (RIGHT )  Physician: Stephan Minister. Anselm Pancoast, MD  FLUOROSCOPY TIME:  None  MEDICATIONS: None  ANESTHESIA/SEDATION: Moderate sedation time: None  PROCEDURE: Informed consent was obtained for a right breast nodule biopsy. An irregular  shaped nodule in the right breast was identified at the 12 o'clock position. This corresponds with the recent diagnostic ultrasound. The breast was prepped with Betadine and a sterile field was created. 1% lidocaine is used for local anesthetic. A total of 5 core biopsies were obtained with an 18 gauge device using ultrasound guidance. Needle position was confirmed within the lesion. Bandage was placed at the puncture site. Specimens placed in formalin.  FINDINGS: Hypoechoic nodule in the right breast at the 12 o'clock position. Findings correspond with the recent diagnostic ultrasound.  COMPLICATIONS: None  IMPRESSION: Ultrasound-guided core biopsies of the right breast nodule.   Electronically Signed   By: Markus Daft M.D.   On: 09/28/2013 17:59   Dg Chest Port 1 View  09/29/2013   CLINICAL DATA:  Hemodialysis catheter placement.  EXAM: PORTABLE CHEST - 1 VIEW 11:42 a.m.  COMPARISON:  09/29/2013 at 5:01 a.m.  FINDINGS: Endotracheal tube, feeding tube, and left central venous catheter appear in good position. A large bore right central venous catheter has been inserted and the tip is at the level of the carina in the superior vena cava. There is new consolidation at the left lung base. Persistent atelectasis at the right base.  Heart size and pulmonary vascularity are normal.  IMPRESSION: New consolidation at the left lung base. Persistent right base atelectasis.  New central venous catheter in position as described. No pneumothorax.   Electronically Signed   By: Rozetta Nunnery M.D.   On: 09/29/2013 12:57   Dg Chest Port 1 View  09/29/2013   CLINICAL DATA:  Followup respiratory failure  EXAM: PORTABLE CHEST - 1 VIEW  COMPARISON:  Prior chest x-ray  09/28/2013  FINDINGS: The endotracheal tube is 3 cm above the carina. A left IJ approach central venous catheter is in place with the tip directed laterally and toward the wall of the upper SVC. An enteric feeding tube is present. The tip of the tube lies below the diaphragm off the field of view.  Improved inspiratory volumes with decreased bibasilar opacities. There is persistent atelectasis. No pulmonary edema. No pneumothorax. The patient remains rotated to the right. Cardiac and mediastinal contours are grossly within normal limits given the rotated positioning.  IMPRESSION: 1. Improved aeration secondary to improved pulmonary edema and decreased bibasilar atelectasis. 2. Stable positioning of support apparatus.   Electronically Signed   By: Jacqulynn Cadet M.D.   On: 09/29/2013 07:38    MEDICATIONS                                                                                                                        Scheduled: . antiseptic oral rinse  7 mL Mouth Rinse QID  . aspirin  81 mg Per Tube Daily  . chlorhexidine  15 mL Mouth Rinse BID  . clonazePAM  1 mg Per Tube TID  . famotidine  20 mg Per Tube QHS  . free water  200 mL Per Tube Q4H  . heparin  5,000 Units Subcutaneous 3 times per day  . insulin aspart  0-9 Units Subcutaneous 6 times per day  . insulin glargine  20 Units Subcutaneous QHS  . tetrahydrozoline  2 drop Both Eyes BID  . thiamine  100 mg Per Tube Daily    ASSESSMENT/PLAN:                                                                                                           63 y/o with chorea of unclear etiology. Empiric trial 5 days PLEX while awaiting paraneoplastic/autoimmune CSF testing to come back. Will follow up.   Dorian Pod, MD [facility] [phone]  09/30/2013, 9:11 AM

## 2013-09-30 NOTE — Progress Notes (Signed)
Increased PS to 15 d/t rr 34-38 bpm.  Sputum sample collected and sent to the lab w/ label and requisition.

## 2013-09-30 NOTE — Progress Notes (Signed)
Peripherally Inserted Central Catheter/Midline Placement  The IV Nurse has discussed with the patient and/or persons authorized to consent for the patient, the purpose of this procedure and the potential benefits and risks involved with this procedure.  The benefits include less needle sticks, lab draws from the catheter and patient may be discharged home with the catheter.  Risks include, but not limited to, infection, bleeding, blood clot (thrombus formation), and puncture of an artery; nerve damage and irregular heat beat.  Alternatives to this procedure were also discussed.  PICC/Midline Placement Documentation        Henderson [place] 09/30/2013, 4:49 PM Obtained phone consent from husband.

## 2013-09-30 NOTE — Progress Notes (Signed)
ANTIBIOTIC CONSULT NOTE - FOLLOW UP  Pharmacy Consult for vancomycin, zosyn Indication: PNA  Allergies  Allergen Reactions  . Codeine Nausea And Vomiting  . Morphine And Related Other (See Comments)    Extreme sedation  . Peanut-Containing Drug Products Nausea And Vomiting  . Grapefruit Concentrate Itching    Patient Measurements: Height: 5\' 6"  (167.6 cm) Weight: 193 lb 2 oz (87.6 kg) IBW/kg (Calculated) : 59.3   Vital Signs: Temp: 98.5 F (36.9 C) (09/11 0824) Temp src: Oral (09/11 0824) BP: 153/55 mmHg (09/11 0923) Pulse Rate: 87 (09/11 0923) Intake/Output from previous day: 09/10 0701 - 09/11 0700 In: 3538.8 [I.V.:608.8; NG/GT:2690] Out: 3570 [Urine:3570] Intake/Output from this shift:    Labs:  Recent Labs  09/28/13 0255 09/29/13 0400 09/30/13 0500  WBC 11.7* 13.0*  --   HGB 7.9* 8.0*  --   PLT 289 281  --   CREATININE 1.86* 1.62* 1.49*   Estimated Creatinine Clearance: 42.5 ml/min (by C-G formula based on Cr of 1.49). No results found for this basename: VANCOTROUGH, Corlis Leak, VANCORANDOM, [place], GENTPEAK, [place], [place], TOBRAPEAK, TOBRARND, AMIKACINPEAK, AMIKACINTROU, AMIKACIN,  in the last 72 hours   Microbiology: Recent Results (from the past 720 hour(s))  URINE CULTURE     Status: None   Collection Time    09/05/13  7:55 PM      Result Value Ref Range Status   Specimen Description URINE, RANDOM   Final   Special Requests NONE   Final   Culture  Setup Time     Final   Value: 09/05/2013 23:24     Performed at [facility]     Final   Value: >=100,000 COLONIES/ML     Performed at [facility]   Culture     Final   Value: KLEBSIELLA PNEUMONIAE     Performed at [facility]   Report Status 09/08/2013 FINAL   Final   Organism ID, Bacteria KLEBSIELLA PNEUMONIAE   Final  CULTURE, BLOOD (ROUTINE X 2)     Status: None   Collection Time    09/06/13  9:10 AM      Result Value Ref Range Status   Specimen Description BLOOD RIGHT ANTECUBITAL   Final   Special Requests BOTTLES DRAWN AEROBIC AND ANAEROBIC 5CC   Final   Culture  Setup Time     Final   Value: 09/06/2013 14:35     Performed at [facility]   Culture     Final   Value: NO GROWTH 5 DAYS     Performed at [facility]   Report Status 09/12/2013 FINAL   Final  CULTURE, BLOOD (ROUTINE X 2)     Status: None   Collection Time    09/06/13  9:15 AM      Result Value Ref Range Status   Specimen Description BLOOD RIGHT HAND   Final   Special Requests BOTTLES DRAWN AEROBIC ONLY [facility]   Final   Culture  Setup Time     Final   Value: 09/06/2013 14:35     Performed at [facility]   Culture     Final   Value: NO GROWTH 5 DAYS     Performed at [facility]   Report Status 09/12/2013 FINAL   Final  CULTURE, BLOOD (ROUTINE X 2)     Status: None   Collection Time    09/17/2013  8:25 PM      Result Value  Ref Range Status   Specimen Description BLOOD RIGHT FOREARM   Final   Special Requests BOTTLES DRAWN AEROBIC AND ANAEROBIC 5CC   Final   Culture  Setup Time     Final   Value: 09/17/2013 01:00     Performed at [facility]   Culture     Final   Value: NO GROWTH 5 DAYS     Performed at [facility]   Report Status 09/23/2013 FINAL   Final  CULTURE, BLOOD (ROUTINE X 2)     Status: None   Collection Time    08/30/2013  8:32 PM      Result Value Ref Range Status   Specimen Description BLOOD HAND RIGHT   Final   Special Requests BOTTLES DRAWN AEROBIC AND ANAEROBIC 5CC   Final   Culture  Setup Time     Final   Value: 09/17/2013 01:00     Performed at [facility]   Culture     Final   Value: NO GROWTH 5 DAYS     Performed at [facility]   Report Status 09/23/2013 FINAL   Final  URINE CULTURE     Status: None   Collection Time    09/13/2013  8:46 PM      Result Value Ref Range Status   Specimen Description URINE, CATHETERIZED   Final   Special Requests CX ADDED AT  0307 ON [id number]   Final   Culture  Setup Time     Final   Value: 09/17/2013 15:23     Performed at [place]     Final   Value: NO GROWTH     Performed at [facility]   Culture     Final   Value: NO GROWTH     Performed at [facility]   Report Status 09/18/2013 FINAL   Final  MRSA PCR SCREENING     Status: None   Collection Time    09/17/13  1:35 AM      Result Value Ref Range Status   MRSA by PCR NEGATIVE  NEGATIVE Final   Comment:            The GeneXpert MRSA Assay (FDA     approved for NASAL specimens     only), is one component of a     comprehensive MRSA colonization     surveillance program. It is not     intended to diagnose MRSA     infection nor to guide or     monitor treatment for     MRSA infections.    Anti-infectives   Start     Dose/Rate Route Frequency Ordered Stop   09/30/13 2300  vancomycin (VANCOCIN) IVPB 750 mg/150 ml premix     750 mg 150 mL/hr over 60 Minutes Intravenous Every 12 hours 09/30/13 0950     09/30/13 1700  piperacillin-tazobactam (ZOSYN) IVPB 3.375 g     3.375 g 12.5 mL/hr over 240 Minutes Intravenous Every 8 hours 09/30/13 0947     09/30/13 1100  piperacillin-tazobactam (ZOSYN) IVPB 3.375 g     3.375 g 100 mL/hr over 30 Minutes Intravenous  Once 09/30/13 0947     09/30/13 1100  vancomycin (VANCOCIN) 1,500 mg in sodium chloride 0.9 % 500 mL IVPB     1,500 mg 250 mL/hr over 120 Minutes Intravenous  Once 09/30/13 0950     09/18/13 0100  vancomycin (VANCOCIN) IVPB  1000 mg/200 mL premix  Status:  Discontinued     1,000 mg 200 mL/hr over 60 Minutes Intravenous Every 24 hours 09/17/13 1013 09/18/13 0919   09/17/13 0600  piperacillin-tazobactam (ZOSYN) IVPB 3.375 g  Status:  Discontinued     3.375 g 12.5 mL/hr over 240 Minutes Intravenous 3 times per day 09/17/13 0106 09/18/13 0919   09/17/13 0115  piperacillin-tazobactam (ZOSYN) IVPB 3.375 g  Status:  Discontinued     3.375 g 100 mL/hr over 30  Minutes Intravenous STAT 09/17/13 0105 09/17/13 0105   09/17/13 0115  vancomycin (VANCOCIN) 1,500 mg in sodium chloride 0.9 % 500 mL IVPB     1,500 mg 250 mL/hr over 120 Minutes Intravenous STAT 09/17/13 0105 09/17/13 0339   09/17/13 0030  piperacillin-tazobactam (ZOSYN) IVPB 3.375 g     3.375 g 100 mL/hr over 30 Minutes Intravenous  Once 09/17/13 0023 09/17/13 0255   09/17/13 0030  vancomycin (VANCOCIN) IVPB 1000 mg/200 mL premix  Status:  Discontinued     1,000 mg 200 mL/hr over 60 Minutes Intravenous  Once 09/17/13 0023 09/17/13 0106      Assessment: 63 yo female with CXR showing new consolidation at left lung base to restart vancomcyin and zosyn for PNA. WBC= 13, tmax= 101, SCr= 1.49 and CrCl ~ 40.  vanc 9/11>> Zosyn 9/11>> Vanc 8/29 >> 8/30 Zosyn 8/29 >> 8/30  9/11 blood x2 9/11 resp 8/28 UCx -neg  8/28 BCx x2 - neg 8/29 BCx x2 - neg  Goal of Therapy:  Vancomycin trough level 15-20 mcg/ml  Plan:  -Zosyn 3.375gm IV q8h -Vancomycin 1500mg  IV x1 followed by 750mg  IV q12h -Will follow renal function, cultures and clinical progress  Hildred Laser, Pharm D 09/30/2013 9:57 AM

## 2013-10-01 ENCOUNTER — Inpatient Hospital Stay (HOSPITAL_COMMUNITY): Payer: BC Managed Care – PPO

## 2013-10-01 LAB — THERAPEUTIC PLASMA EXCHANGE (BLOOD BANK)
Plasma volume needed: 2900
UNIT DIVISION: 0
UNIT DIVISION: 0
UNIT DIVISION: 0
UNIT DIVISION: 0
Unit division: 0
Unit division: 0
Unit division: 0
Unit division: 0
Unit division: 0
Unit division: 0
Unit division: 0

## 2013-10-01 LAB — GLUCOSE, CAPILLARY
GLUCOSE-CAPILLARY: 103 mg/dL — AB (ref 70–99)
GLUCOSE-CAPILLARY: 162 mg/dL — AB (ref 70–99)
Glucose-Capillary: 157 mg/dL — ABNORMAL HIGH (ref 70–99)
Glucose-Capillary: 109 mg/dL — ABNORMAL HIGH (ref 70–99)
Glucose-Capillary: 126 mg/dL — ABNORMAL HIGH (ref 70–99)
Glucose-Capillary: 127 mg/dL — ABNORMAL HIGH (ref 70–99)

## 2013-10-01 LAB — CBC
HEMATOCRIT: 24.5 % — AB (ref 36.0–46.0)
Hemoglobin: 7 g/dL — ABNORMAL LOW (ref 12.0–15.0)
MCH: 27 pg (ref 26.0–34.0)
MCHC: 28.6 g/dL — ABNORMAL LOW (ref 30.0–36.0)
MCV: 94.6 fL (ref 78.0–100.0)
Platelets: 209 10*3/uL (ref 150–400)
RBC: 2.59 MIL/uL — ABNORMAL LOW (ref 3.87–5.11)
RDW: 16.6 % — AB (ref 11.5–15.5)
WBC: 11.7 10*3/uL — AB (ref 4.0–10.5)

## 2013-10-01 LAB — TRIGLYCERIDES: Triglycerides: 128 mg/dL (ref <150)

## 2013-10-01 LAB — COMPREHENSIVE METABOLIC PANEL
ALT: 57 U/L — AB (ref 0–35)
AST: 49 U/L — AB (ref 0–37)
Albumin: 2.1 g/dL — ABNORMAL LOW (ref 3.5–5.2)
Alkaline Phosphatase: 247 U/L — ABNORMAL HIGH (ref 39–117)
Anion gap: 8 (ref 5–15)
BILIRUBIN TOTAL: 0.5 mg/dL (ref 0.3–1.2)
BUN: 20 mg/dL (ref 6–23)
CHLORIDE: 108 meq/L (ref 96–112)
CO2: 30 mEq/L (ref 19–32)
Calcium: 8.4 mg/dL (ref 8.4–10.5)
Creatinine, Ser: 1.47 mg/dL — ABNORMAL HIGH (ref 0.50–1.10)
GFR calc Af Amer: 42 mL/min — ABNORMAL LOW (ref >90)
GFR calc non Af Amer: 37 mL/min — ABNORMAL LOW (ref >90)
Glucose, Bld: 107 mg/dL — ABNORMAL HIGH (ref 70–99)
POTASSIUM: 4 meq/L (ref 3.7–5.3)
SODIUM: 146 meq/L (ref 137–147)
Total Protein: 5.3 g/dL — ABNORMAL LOW (ref 6.0–8.3)

## 2013-10-01 LAB — PROCALCITONIN: Procalcitonin: 0.42 ng/mL

## 2013-10-01 MED ORDER — FREE WATER
300.0000 mL | Status: DC
  Administered 2013-10-01 – 2013-10-02 (×6): 300 mL

## 2013-10-01 MED ORDER — SODIUM CHLORIDE 0.9 % IV SOLN
INTRAVENOUS | Status: DC
  Administered 2013-10-03: via INTRAVENOUS

## 2013-10-01 MED ORDER — METOPROLOL TARTRATE 25 MG/10 ML ORAL SUSPENSION
25.0000 mg | Freq: Two times a day (BID) | ORAL | Status: DC
  Administered 2013-10-01 (×2): 25 mg
  Filled 2013-10-01 (×4): qty 10

## 2013-10-01 NOTE — Progress Notes (Signed)
PULMONARY / CRITICAL CARE MEDICINE  Name: [name] MRN: [id number] DOB: 16-Apr-1950    ADMISSION DATE:  09/04/2013 CONSULTATION DATE:  09/18/2013  REFERRING MD :  Thereasa Solo   CHIEF COMPLAINT:  Concern for airway protection   INITIAL PRESENTATION: 63 yo with three week history of choreiform movements presents 8/28 with worsening involuntary movements, metabolic acidosis, rhabdomyolysis  and AKI.   STUDIES / EVENTS:  8/28  Admitted 8/31  MRI brain: bilateral basal ganglial altered signal intensity of uncertain etiology 9/62  Intubated in MRI secondary to oversedation, extubated upon arrival to ICU.  Re-intubated for "severe chorea and hypoxemic resp failure" 8/31  TTE: LVEF 45%, grade 2 DD 9/02  CT chest / abdomen / pelvis: 1.2 cm L breast mass, small bilateral effusions / atelectasis, atrophic R kidney with bilateral cysts 9/03 [place] R breast: Suspicious right breast mass 9/04 Extubated 9/05 Updated family in detail about findings and care plan 9/07 Was conversant with RN, then sudden onset of central apnea and junctional bradycardia. HR responded to atropine. Intubated for third time this hospitalization 9/07 CT head: No acute change 9/07 EEG: no epileptiform discharges 9/08 CCS consult for possible breast mass bx. Not palpable - therefore, US guided bx by IR requested.  9/09 Core needle bx of R breast lesion by IR 9/10 HD cath placed for TPE (therapeutic plasma exchange) 9/11 TPE initiated. Plan QOD for 5 treatments 9/11 Fever, purulent secretions. CXR from 9/10 c/w possible PNA. Cultures obtained. abx initiated  LINES/TUBES: ETT 8/31 >> 8/31, 8/31 >> 9/04, 9/07 >>  L IJ CVL 8/31 >> 9/11 R IJ HD cath 9/10 >>  PICC (ordered) 9/11 >>   MICRO: Urine 8/28 >> NEG Blood 8/29 >> NEG Blood 9/11 >>  Resp 9/11 >>   PCT 9/11: 0.43  9/12:     9/13:   SUBJ:  Sedated  VITAL SIGNS: Temp:  [98.8 F (37.1 C)-99.9 F (37.7 C)] 99.5 F (37.5 C) (09/12 0747) Pulse Rate:  [76-102] 87  (09/12 1000) Resp:  [0-25] 16 (09/12 1000) BP: (115-169)/(45-122) 166/70 mmHg (09/12 1000) SpO2:  [99 %-100 %] 100 % (09/12 1000) FiO2 (%):  [40 %] 40 % (09/12 0822) Weight:  [88.1 kg (194 lb 3.6 oz)] 88.1 kg (194 lb 3.6 oz) (09/12 0530)  HEMODYNAMICS:   VENTILATOR SETTINGS: Vent Mode:  [-] PRVC FiO2 (%):  [40 %] 40 % Set Rate:  [16 bmp] 16 bmp Vt Set:  [480 mL] 480 mL PEEP:  [5 cmH20] 5 cmH20 Pressure Support:  [15 cmH20] 15 cmH20 Plateau Pressure:  [18 cmH20-20 cmH20] 20 cmH20 INTAKE / OUTPUT: Intake/Output     09/11 0701 - 09/12 0700 09/12 0701 - 09/13 0700   I.V. (mL/kg) 648.7 (7.4) 30 (0.3)   Other 130    NG/GT 2305    IV Piggyback 250 25   Total Intake(mL/kg) 3333.7 (37.8) 55 (0.6)   Urine (mL/kg/hr) 3675 (1.7) 350 (1.1)   Total Output 3675 350   Net -341.3 -295         PHYSICAL EXAMINATION: General: RASS -2 Neuro: calm on vent, no corea HEENT: PERRL, EOMI Cardiovascular: s1 s2 Regular, no murmurs Lungs: scattered rhonchi anterior clear Abdomen:  Soft, bowel sounds present Ext: Trace symmetric edema, warm  LABS: I have reviewed all of today's lab results. 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 Baseline Cr unknown Mild hypernatremia, resolved P:   Monitor BMET am Monitor I/Os Cont free water and increase  GASTROINTESTINAL A:   Dysphagia P:    SUP: famotidine Cont TFs  HEMATOLOGIC / ONC A:   R breast mass Anemia P:  DVT px: SQ heparin Monitor CBC intermittently Transfuse per usual ICU guidelines F/U path from core needle bx 9/10  INFECTIOUS A:   Fever, recurrent leukocytosis Probable HCAP P:   Sputum 9/11>>>  vanc 9/10>>> Zosyn 9/10>>  Micro and abx as above Narrow in am if culture neg  ENDOCRINE A:   DM 2, controlled Severe hyperglycemia while on systemic steroids, resolved P:   Cont Lantus, controlled at current dose Cont SSI  NEUROLOGIC A:   Chorea of unclear etiology Acute encephalopathy  P:   Further eval and mgmt per Neurology Cont propofol gtt with wua Cont PRN fentanyl  Cont PRN midaz TPE to begin 9/11  I have personally obtained history, examined patient, evaluated and interpreted laboratory and imaging results, reviewed medical records, formulated assessment / plan and placed orders.  30 mins CCM time.    Raylene Miyamoto., MD Pulmonary and [place] Pager: [phone]  10/01/2013, 10:30 AM

## 2013-10-01 NOTE — Progress Notes (Signed)
NEURO HOSPITALIST PROGRESS NOTE   SUBJECTIVE:                                                                                          No new neurological developments. Sedated with propofol, intubated on the vent. PLEX 1/5 yesterday. CSF IGG normal. Remaining autoimmune/paraneoplastic CSF studies pending. Fever, purulent secretions. CXR from 9/10 c/w possible PNA. 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   OBJECTIVE:                                                                                             Vital signs in last 24 hours: Temp:  [98.5 F (36.9 C)-99.9 F (37.7 C)] 99.5 F (37.5 C) (09/12 0747) Pulse Rate:  [83-102] 84 (09/12 0630) Resp:  [0-27] 16 (09/12 0630) BP: (115-169)/(45-122) 137/57 mmHg (09/12 0630) SpO2:  [99 %-100 %] 100 % (09/12 0630) FiO2 (%):  [40 %] 40 % (09/12 0400) Weight:  [88.1 kg (194 lb 3.6 oz)] 88.1 kg (194 lb 3.6 oz) (09/12 0530)  Intake/Output from previous day: 09/11 0701 - 09/12 0700 In: 3333.7 [I.V.:648.7; NG/GT:2305; IV Piggyback:250] Out: 3675 [Urine:3675] Intake/Output this shift:   Nutritional status:    Past Medical History  Diagnosis Date  . Hypertension   . Diabetes mellitus   . Hyperlipidemia   . History of noncompliance with medical treatment   . Shingles 08/18/2013   Neurologic Exam:  On Propofol.  Mental status: unresponsive to verbal commands but then exhibits spontaneous choreiform movements.  CN 2-12: pupils 1 mm, reactive. No gaze preference. Doesn't blink to threat. Tongue: intubated  Motor: choreiform movements involving head, arms, legs.  Sensory: reacts to pain.  DTR's: 1 all over.  Plantars: downgoing.  Coordination and gait: unable to test   Lab Results: Lab Results  Component Value Date/Time   CHOL 287* 08/18/2013  2:44 AM   Lipid Panel  Recent Labs  10/01/13 0430  TRIG 128    Studies/Results: Dg Chest Port  1 View  09/29/2013   CLINICAL DATA:  Hemodialysis catheter placement.  EXAM: PORTABLE CHEST - 1 VIEW 11:42 a.m.  COMPARISON:  09/29/2013 at 5:01 a.m.  FINDINGS: Endotracheal tube, feeding tube, and left central venous catheter appear in good position. A large bore right central venous catheter has been inserted and the tip is at the level of the carina in the superior vena cava. There is new consolidation at the left lung base. Persistent atelectasis at the right base.  Heart size and pulmonary vascularity  are normal.  IMPRESSION: New consolidation at the left lung base. Persistent right base atelectasis.  New central venous catheter in position as described. No pneumothorax.   Electronically Signed   By: Rozetta Nunnery M.D.   On: 09/29/2013 12:57    MEDICATIONS                                                                                          Scheduled: . antiseptic oral rinse  7 mL Mouth Rinse QID  . aspirin  81 mg Per Tube Daily  . chlorhexidine  15 mL Mouth Rinse BID  . clonazePAM  1 mg Per Tube TID  . famotidine  20 mg Per Tube QHS  . free water  200 mL Per Tube Q4H  . heparin  5,000 Units Subcutaneous 3 times per day  . insulin aspart  0-9 Units Subcutaneous 6 times per day  . insulin glargine  20 Units Subcutaneous QHS  . piperacillin-tazobactam (ZOSYN)  IV  3.375 g Intravenous Q8H  . sodium chloride  10-40 mL Intracatheter Q12H  . tetrahydrozoline  2 drop Both Eyes BID  . thiamine  100 mg Per Tube Daily  . vancomycin  750 mg Intravenous Q12H   ASSESSMENT/PLAN:                                                                              63 y/o with chorea of unclear etiology.  Breast biopsy consistent with benign fibroadenoma. Empiric trial 5 days PLEX while awaiting paraneoplastic/autoimmune CSF testing to come back.  Will follow up.   Dorian Pod, MD [facility] [phone]  10/01/2013, 7:59 AM

## 2013-10-01 NOTE — Progress Notes (Signed)
Attempted to wean on PS again, pt w/ low RR and low min volume (on 5 ps, increased to 15 ps).  Pt placed back on full vent support. RN aware.

## 2013-10-02 ENCOUNTER — Inpatient Hospital Stay (HOSPITAL_COMMUNITY): Payer: BC Managed Care – PPO

## 2013-10-02 LAB — COMPREHENSIVE METABOLIC PANEL
ALT: 52 U/L — ABNORMAL HIGH (ref 0–35)
AST: 39 U/L — ABNORMAL HIGH (ref 0–37)
Albumin: 1.8 g/dL — ABNORMAL LOW (ref 3.5–5.2)
Alkaline Phosphatase: 277 U/L — ABNORMAL HIGH (ref 39–117)
Anion gap: 10 (ref 5–15)
BILIRUBIN TOTAL: 0.4 mg/dL (ref 0.3–1.2)
BUN: 18 mg/dL (ref 6–23)
CHLORIDE: 102 meq/L (ref 96–112)
CO2: 29 meq/L (ref 19–32)
Calcium: 8.1 mg/dL — ABNORMAL LOW (ref 8.4–10.5)
Creatinine, Ser: 1.52 mg/dL — ABNORMAL HIGH (ref 0.50–1.10)
GFR, EST AFRICAN AMERICAN: 41 mL/min — AB (ref >90)
GFR, EST NON AFRICAN AMERICAN: 35 mL/min — AB (ref >90)
Glucose, Bld: 147 mg/dL — ABNORMAL HIGH (ref 70–99)
Potassium: 3.5 mEq/L — ABNORMAL LOW (ref 3.7–5.3)
Sodium: 141 mEq/L (ref 137–147)
Total Protein: 5.3 g/dL — ABNORMAL LOW (ref 6.0–8.3)

## 2013-10-02 LAB — GLUCOSE, CAPILLARY
GLUCOSE-CAPILLARY: 137 mg/dL — AB (ref 70–99)
GLUCOSE-CAPILLARY: 157 mg/dL — AB (ref 70–99)
GLUCOSE-CAPILLARY: 171 mg/dL — AB (ref 70–99)
GLUCOSE-CAPILLARY: 199 mg/dL — AB (ref 70–99)
Glucose-Capillary: 109 mg/dL — ABNORMAL HIGH (ref 70–99)
Glucose-Capillary: 167 mg/dL — ABNORMAL HIGH (ref 70–99)
Glucose-Capillary: 178 mg/dL — ABNORMAL HIGH (ref 70–99)

## 2013-10-02 LAB — BLOOD GAS, ARTERIAL
ACID-BASE EXCESS: 4.4 mmol/L — AB (ref 0.0–2.0)
Bicarbonate: 28.5 mEq/L — ABNORMAL HIGH (ref 20.0–24.0)
DRAWN BY: 10006
FIO2: 0.4 %
LHR: 16 {breaths}/min
MECHVT: 480 mL
O2 SAT: 97.4 %
PCO2 ART: 46.4 mmHg — AB (ref 35.0–45.0)
PEEP: 5 cmH2O
Patient temperature: 101
TCO2: 29.9 mmol/L (ref 0–100)
pH, Arterial: 7.412 (ref 7.350–7.450)
pO2, Arterial: 94.5 mmHg (ref 80.0–100.0)

## 2013-10-02 LAB — PREPARE RBC (CROSSMATCH)

## 2013-10-02 LAB — PROCALCITONIN: Procalcitonin: 0.35 ng/mL

## 2013-10-02 MED ORDER — ACD FORMULA A 0.73-2.45-2.2 GM/100ML VI SOLN
500.0000 mL | Status: DC
  Administered 2013-10-02: 500 mL via INTRAVENOUS
  Filled 2013-10-02: qty 500

## 2013-10-02 MED ORDER — DIPHENHYDRAMINE HCL 25 MG PO CAPS: 25.0000 mg | ORAL_CAPSULE | Freq: Four times a day (QID) | ORAL | Status: DC | PRN

## 2013-10-02 MED ORDER — SODIUM CHLORIDE 0.9 % IV SOLN
Freq: Once | INTRAVENOUS | Status: AC
  Administered 2013-10-02: 06:00:00 via INTRAVENOUS

## 2013-10-02 MED ORDER — DEXTROSE 5 % IV SOLN
1.0000 g | INTRAVENOUS | Status: DC
  Administered 2013-10-02: 1 g via INTRAVENOUS
  Filled 2013-10-02 (×2): qty 10

## 2013-10-02 MED ORDER — ANTICOAGULANT SODIUM CITRATE 4% (200MG/5ML) IV SOLN
5.0000 mL | Freq: Once | Status: AC
  Administered 2013-10-02: 5 mL
  Filled 2013-10-02: qty 250

## 2013-10-02 MED ORDER — VANCOMYCIN HCL IN DEXTROSE 750-5 MG/150ML-% IV SOLN
750.0000 mg | Freq: Two times a day (BID) | INTRAVENOUS | Status: DC
Start: 2013-10-02 — End: 2013-10-04
  Administered 2013-10-02 – 2013-10-04 (×4): 750 mg via INTRAVENOUS
  Filled 2013-10-02 (×4): qty 150

## 2013-10-02 MED ORDER — ACD FORMULA A 0.73-2.45-2.2 GM/100ML VI SOLN
Status: AC
Start: 2013-10-02 — End: 2013-10-02
  Administered 2013-10-02: 500 mL via INTRAVENOUS
  Filled 2013-10-02: qty 500

## 2013-10-02 MED ORDER — METOPROLOL TARTRATE 25 MG/10 ML ORAL SUSPENSION
50.0000 mg | Freq: Two times a day (BID) | ORAL | Status: DC
  Administered 2013-10-02 – 2013-10-04 (×6): 50 mg
  Filled 2013-10-02 (×8): qty 20

## 2013-10-02 MED ORDER — ACETAMINOPHEN 325 MG PO TABS: 650.0000 mg | ORAL_TABLET | ORAL | Status: DC | PRN

## 2013-10-02 MED ORDER — CALCIUM CARBONATE ANTACID 500 MG PO CHEW
2.0000 | CHEWABLE_TABLET | ORAL | Status: AC
  Administered 2013-10-02 (×2): 400 mg via ORAL
  Filled 2013-10-02 (×2): qty 2

## 2013-10-02 MED ORDER — SODIUM CHLORIDE 0.9 % IV SOLN
4.0000 g | Freq: Once | INTRAVENOUS | Status: AC
  Administered 2013-10-02: 4 g via INTRAVENOUS
  Filled 2013-10-02: qty 40

## 2013-10-02 NOTE — Progress Notes (Signed)
PULMONARY / CRITICAL CARE MEDICINE  Name: [name] MRN: [id number] DOB: 11/28/1950    ADMISSION DATE:  09/15/2013 CONSULTATION DATE:  09/14/2013  REFERRING MD :  Thereasa Solo   CHIEF COMPLAINT:  Concern for airway protection   INITIAL PRESENTATION: 63 yo with three week history of choreiform movements presents 8/28 with worsening involuntary movements, metabolic acidosis, rhabdomyolysis  and AKI.   STUDIES / EVENTS:  8/28  Admitted 8/31  MRI brain: bilateral basal ganglial altered signal intensity of uncertain etiology 3/26  Intubated in MRI secondary to oversedation, extubated upon arrival to ICU.  Re-intubated for "severe chorea and hypoxemic resp failure" 8/31  TTE: LVEF 45%, grade 2 DD 9/02  CT chest / abdomen / pelvis: 1.2 cm L breast mass, small bilateral effusions / atelectasis, atrophic R kidney with bilateral cysts 9/03 [place] R breast: Suspicious right breast mass 9/04 Extubated 9/05 Updated family in detail about findings and care plan 9/07 Was conversant with RN, then sudden onset of central apnea and junctional bradycardia. HR responded to atropine. Intubated for third time this hospitalization 9/07 CT head: No acute change 9/07 EEG: no epileptiform discharges 9/08 CCS consult for possible breast mass bx. Not palpable - therefore, US guided bx by IR requested.  9/09 Core needle bx of R breast lesion by IR 9/10 HD cath placed for TPE (therapeutic plasma exchange) 9/11 TPE initiated. Plan QOD for 5 treatments 9/11 Fever, purulent secretions. CXR from 9/10 c/w possible PNA. Cultures obtained. abx initiated  LINES/TUBES: ETT 8/31 >> 8/31, 8/31 >> 9/04, 9/07 >>  L IJ CVL 8/31 >> 9/11 R IJ HD cath 9/10 >>  PICC (ordered) 9/11 >>   MICRO: Urine 8/28 >> NEG Blood 8/29 >> NEG Blood 9/11 >>  Resp 9/11 >>   PCT 9/11: 0.43  9/12: 0.42    9/13: 0.35  SUBJ:  With prop reduction has movements, apneic wean this am   VITAL SIGNS: Temp:  [98.4 F (36.9 C)-101 F (38.3 C)]  99.6 F (37.6 C) (09/13 0749) Pulse Rate:  [71-93] 86 (09/13 0846) Resp:  [15-24] 16 (09/13 0846) BP: (106-192)/(48-153) 164/78 mmHg (09/13 0846) SpO2:  [100 %] 100 % (09/13 0846) FiO2 (%):  [40 %] 40 % (09/13 0846) Weight:  [86.6 kg (190 lb 14.7 oz)] 86.6 kg (190 lb 14.7 oz) (09/13 0600)  HEMODYNAMICS:   VENTILATOR SETTINGS: Vent Mode:  [-] PRVC FiO2 (%):  [40 %] 40 % Set Rate:  [16 bmp] 16 bmp Vt Set:  [480 mL] 480 mL PEEP:  [5 cmH20] 5 cmH20 Plateau Pressure:  [18 cmH20] 18 cmH20 INTAKE / OUTPUT: Intake/Output     09/12 0701 - 09/13 0700 09/13 0701 - 09/14 0700   I.V. (mL/kg) 657.6 (7.6)    Blood 12.5 175   Other     NG/GT 2740    IV Piggyback 450    Total Intake(mL/kg) 3860.1 (44.6) 175 (2)   Urine (mL/kg/hr) 2570 (1.2)    Total Output 2570     Net +1290.1 +175         PHYSICAL EXAMINATION: General: RASS -2 Neuro: calm on vent, no corea HEENT: PERRL, EOMI Cardiovascular: s1 s2 Regular, no murmurs Lungs: scattered rhonchi Abdomen:  Soft, bowel sounds present Ext: Trace symmetric edema, warm  LABS: I have reviewed all of today's lab results. 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 Baseline Cr unknown think we are at her baseline now Mild hypernatremia, resolved P:   Monitor BMET am Monitor I/Os Dc free water  GASTROINTESTINAL A:   Dysphagia P:    SUP: famotidine Cont TFs BM noted  HEMATOLOGIC / ONC A:   R breast mass Anemia worsening, no bleeding P:  DVT px: SQ heparin Monitor CBC am Transfuse 1 unit F/U path from core needle bx 9/10  INFECTIOUS A:   Fever, recurrent leukocytosis Probable HCAP, PCT neg essentially x 3 P:   Sputum 9/11>>>  vanc 9/10>>>9/13 Zosyn 9/10>>9/13 Ceftriaxone 9/13>>>  Micro and abx as above Narrow to ceftriaxone given rt base linear changes atx, consider 5 days total  ENDOCRINE A:   DM 2, controlled Severe hyperglycemia while on systemic steroids, resolved P:   Keep lantus same dose Cont SSI  NEUROLOGIC A:   Chorea of unclear etiology Acute encephalopathy  P:   Further eval and mgmt per Neurology Cont propofol gtt with wua, is a limiting factor was spastic noted Cont PRN fentanyl  Cont PRN midaz TPE to begin 9/11, repeat 9/13  I have personally obtained history, examined patient, evaluated and interpreted laboratory and imaging results, reviewed medical records, formulated assessment / plan and placed orders.  30 mins CCM time.  Prognosis poor   Raylene Miyamoto., MD Pulmonary and [place] Pager: [phone]  10/02/2013, 8:47 AM

## 2013-10-02 NOTE — Progress Notes (Addendum)
ANTIBIOTIC CONSULT NOTE - FOLLOW UP  Pharmacy Consult for vancomycin, zosyn Indication: PNA  Allergies  Allergen Reactions  . Codeine Nausea And Vomiting  . Morphine And Related Other (See Comments)    Extreme sedation  . Peanut-Containing Drug Products Nausea And Vomiting  . Grapefruit Concentrate Itching    Patient Measurements: Height: 5\' 6"  (167.6 cm) Weight: 190 lb 14.7 oz (86.6 kg) IBW/kg (Calculated) : 59.3   Vital Signs: Temp: 98.9 F (37.2 C) (09/13 1606) Temp src: Oral (09/13 1606) BP: 167/70 mmHg (09/13 1500) Pulse Rate: 79 (09/13 1500) Intake/Output from previous day: 09/12 0701 - 09/13 0700 In: 3900.1 [I.V.:657.6; Blood:12.5; NG/GT:2780; IV [id number]:149] Out: 7026 [Urine:2570] Intake/Output from this shift: Total I/O In: 2263.9 [I.V.:243.9; Blood:350; NG/GT:830; IV Piggyback:840] Out: 450 [Urine:450]  Labs:  Recent Labs  09/30/13 1316 10/01/13 0430 10/02/13 0317  WBC  --  11.7* 10.8*  HGB 8.2* 7.0* 6.6*  PLT  --  209 191  CREATININE 1.70* 1.47* 1.52*   Estimated Creatinine Clearance: 41.4 ml/min (by C-G formula based on Cr of 1.52). No results found for this basename: VANCOTROUGH, Corlis Leak, VANCORANDOM, [place], GENTPEAK, [place], [place], TOBRAPEAK, TOBRARND, AMIKACINPEAK, AMIKACINTROU, AMIKACIN,  in the last 72 hours   Microbiology: Recent Results (from the past 720 hour(s))  URINE CULTURE     Status: None   Collection Time    09/05/13  7:55 PM      Result Value Ref Range Status   Specimen Description URINE, RANDOM   Final   Special Requests NONE   Final   Culture  Setup Time     Final   Value: 09/05/2013 23:24     Performed at [place]     Final   Value: >=100,000 COLONIES/ML     Performed at [facility]   Culture     Final   Value: KLEBSIELLA PNEUMONIAE     Performed at [facility]   Report Status 09/08/2013 FINAL   Final   Organism ID, Bacteria KLEBSIELLA PNEUMONIAE   Final   CULTURE, BLOOD (ROUTINE X 2)     Status: None   Collection Time    09/06/13  9:10 AM      Result Value Ref Range Status   Specimen Description BLOOD RIGHT ANTECUBITAL   Final   Special Requests BOTTLES DRAWN AEROBIC AND ANAEROBIC 5CC   Final   Culture  Setup Time     Final   Value: 09/06/2013 14:35     Performed at [facility]   Culture     Final   Value: NO GROWTH 5 DAYS     Performed at [facility]   Report Status 09/12/2013 FINAL   Final  CULTURE, BLOOD (ROUTINE X 2)     Status: None   Collection Time    09/06/13  9:15 AM      Result Value Ref Range Status   Specimen Description BLOOD RIGHT HAND   Final   Special Requests BOTTLES DRAWN AEROBIC ONLY [facility]   Final   Culture  Setup Time     Final   Value: 09/06/2013 14:35     Performed at [facility]   Culture     Final   Value: NO GROWTH 5 DAYS     Performed at [facility]   Report Status 09/12/2013 FINAL   Final  CULTURE, BLOOD (ROUTINE X 2)     Status: None   Collection Time  09/15/2013  8:25 PM      Result Value Ref Range Status   Specimen Description BLOOD RIGHT FOREARM   Final   Special Requests BOTTLES DRAWN AEROBIC AND ANAEROBIC 5CC   Final   Culture  Setup Time     Final   Value: 09/17/2013 01:00     Performed at [facility]   Culture     Final   Value: NO GROWTH 5 DAYS     Performed at [facility]   Report Status 09/23/2013 FINAL   Final  CULTURE, BLOOD (ROUTINE X 2)     Status: None   Collection Time    09/08/2013  8:32 PM      Result Value Ref Range Status   Specimen Description BLOOD HAND RIGHT   Final   Special Requests BOTTLES DRAWN AEROBIC AND ANAEROBIC 5CC   Final   Culture  Setup Time     Final   Value: 09/17/2013 01:00     Performed at [facility]   Culture     Final   Value: NO GROWTH 5 DAYS     Performed at [facility]   Report Status 09/23/2013 FINAL   Final  URINE CULTURE     Status: None   Collection Time    09/14/2013   8:46 PM      Result Value Ref Range Status   Specimen Description URINE, CATHETERIZED   Final   Special Requests CX ADDED AT 0307 ON [id number]   Final   Culture  Setup Time     Final   Value: 09/17/2013 15:23     Performed at [place]     Final   Value: NO GROWTH     Performed at [facility]   Culture     Final   Value: NO GROWTH     Performed at [facility]   Report Status 09/18/2013 FINAL   Final  MRSA PCR SCREENING     Status: None   Collection Time    09/17/13  1:35 AM      Result Value Ref Range Status   MRSA by PCR NEGATIVE  NEGATIVE Final   Comment:            The GeneXpert MRSA Assay (FDA     approved for NASAL specimens     only), is one component of a     comprehensive MRSA colonization     surveillance program. It is not     intended to diagnose MRSA     infection nor to guide or     monitor treatment for     MRSA infections.  CULTURE, RESPIRATORY (NON-EXPECTORATED)     Status: None   Collection Time    09/30/13 12:30 PM      Result Value Ref Range Status   Specimen Description TRACHEAL ASPIRATE   Final   Special Requests Normal   Final   Gram Stain     Final   Value: FEW WBC PRESENT,BOTH PMN AND MONONUCLEAR     NO SQUAMOUS EPITHELIAL CELLS SEEN     ABUNDANT GRAM POSITIVE COCCI     IN PAIRS IN CLUSTERS     Performed at [facility]   Culture     Final   Value: ABUNDANT STAPHYLOCOCCUS AUREUS     Note: RIFAMPIN AND GENTAMICIN SHOULD NOT BE USED AS SINGLE DRUGS FOR TREATMENT OF STAPH INFECTIONS.  Performed at [facility]   Report Status PENDING   Incomplete  CULTURE, BLOOD (ROUTINE X 2)     Status: None   Collection Time    09/30/13  1:10 PM      Result Value Ref Range Status   Specimen Description BLOOD HEMODIALYSIS CATHETER   Final   Special Requests BOTTLES DRAWN AEROBIC AND ANAEROBIC 5CC   Final   Culture  Setup Time     Final   Value: 09/30/2013 17:25     Performed at [facility]    Culture     Final   Value:        BLOOD CULTURE RECEIVED NO GROWTH TO DATE CULTURE WILL BE HELD FOR 5 DAYS BEFORE ISSUING A FINAL NEGATIVE REPORT     Performed at [facility]   Report Status PENDING   Incomplete    Anti-infectives   Start     Dose/Rate Route Frequency Ordered Stop   10/02/13 1000  cefTRIAXone (ROCEPHIN) 1 g in dextrose 5 % 50 mL IVPB     1 g 100 mL/hr over 30 Minutes Intravenous Every 24 hours 10/02/13 0853     09/30/13 2300  vancomycin (VANCOCIN) IVPB 750 mg/150 ml premix  Status:  Discontinued     750 mg 150 mL/hr over 60 Minutes Intravenous Every 12 hours 09/30/13 0950 10/02/13 0853   09/30/13 1700  piperacillin-tazobactam (ZOSYN) IVPB 3.375 g  Status:  Discontinued     3.375 g 12.5 mL/hr over 240 Minutes Intravenous Every 8 hours 09/30/13 0947 10/02/13 0853   09/30/13 1100  piperacillin-tazobactam (ZOSYN) IVPB 3.375 g     3.375 g 100 mL/hr over 30 Minutes Intravenous  Once 09/30/13 0947 09/30/13 1347   09/30/13 1100  vancomycin (VANCOCIN) 1,500 mg in sodium chloride 0.9 % 500 mL IVPB     1,500 mg 250 mL/hr over 120 Minutes Intravenous  Once 09/30/13 0950 09/30/13 1552   09/18/13 0100  vancomycin (VANCOCIN) IVPB 1000 mg/200 mL premix  Status:  Discontinued     1,000 mg 200 mL/hr over 60 Minutes Intravenous Every 24 hours 09/17/13 1013 09/18/13 0919   09/17/13 0600  piperacillin-tazobactam (ZOSYN) IVPB 3.375 g  Status:  Discontinued     3.375 g 12.5 mL/hr over 240 Minutes Intravenous 3 times per day 09/17/13 0106 09/18/13 0919   09/17/13 0115  piperacillin-tazobactam (ZOSYN) IVPB 3.375 g  Status:  Discontinued     3.375 g 100 mL/hr over 30 Minutes Intravenous STAT 09/17/13 0105 09/17/13 0105   09/17/13 0115  vancomycin (VANCOCIN) 1,500 mg in sodium chloride 0.9 % 500 mL IVPB     1,500 mg 250 mL/hr over 120 Minutes Intravenous STAT 09/17/13 0105 09/17/13 0339   09/17/13 0030  piperacillin-tazobactam (ZOSYN) IVPB 3.375 g     3.375 g 100 mL/hr over 30  Minutes Intravenous  Once 09/17/13 0023 09/17/13 0255   09/17/13 0030  vancomycin (VANCOCIN) IVPB 1000 mg/200 mL premix  Status:  Discontinued     1,000 mg 200 mL/hr over 60 Minutes Intravenous  Once 09/17/13 0023 09/17/13 0106      Assessment: 63 yo female on rocephin for PNA and now cultures showing GPC to restart vancomycin WBC= 13, tmax= 101, SCr= 1.49 and CrCl ~ 40.  vanc 9/11>> 9/13; restart 9/13 Zosyn 9/11>> 9/12;  Vanc 8/29 >> 8/30 Zosyn 8/29 >> 8/30 Rocephin 9/13>>  9/11 blood x2 9/11 resp staph aureus 8/28 UCx -neg  8/28 BCx x2 - neg 8/29 BCx x2 -  neg   Goal of Therapy:  Vancomycin trough level 15-20 mcg/ml  Plan:  -Vancomycin 750mg  IV q12h -Will follow renal function, cultures and clinical progress  Hildred Laser, Pharm D 10/02/2013 4:34 PM

## 2013-10-02 NOTE — Progress Notes (Signed)
eLink Physician-Brief Progress Note Patient Name: WILLODEAN LEVEN DOB: 03/29/1950 MRN: [id number]   Date of Service  10/02/2013  HPI/Events of Note  hgb less than 7 this am.  No signs of active bleeding.  eICU Interventions  Transfuse 1 unit of prbc's     Intervention Category Intermediate Interventions: Other:  Mauri [place], P 10/02/2013, 4:02 AM

## 2013-10-02 NOTE — Progress Notes (Signed)
CRITICAL VALUE ALERT  Critical value received:  Hgb 6.6  Date of notification:  10/02/2013  Time of notification:  03:42  Critical value read back:Yes.    Nurse who received alert:  BShepherd, RN  MD notified (1st page):  Tamala Julian  Time of first page:  03:57  MD notified (2nd page):  Time of second page:  Responding MD:  Tamala Julian  Time MD responded:  03:59

## 2013-10-03 ENCOUNTER — Inpatient Hospital Stay (HOSPITAL_COMMUNITY): Payer: BC Managed Care – PPO

## 2013-10-03 LAB — THERAPEUTIC PLASMA EXCHANGE (BLOOD BANK)
PLASMA VOLUME NEEDED: 2900
Plasma Exchange: 2900
UNIT DIVISION: 0
UNIT DIVISION: 0
UNIT DIVISION: 0
UNIT DIVISION: 0
Unit division: 0
Unit division: 0
Unit division: 0
Unit division: 0
Unit division: 0
Unit division: 0
Unit division: 0

## 2013-10-03 LAB — BASIC METABOLIC PANEL
Anion gap: 9 (ref 5–15)
BUN: 17 mg/dL (ref 6–23)
CO2: 31 mEq/L (ref 19–32)
Calcium: 8.1 mg/dL — ABNORMAL LOW (ref 8.4–10.5)
Chloride: 105 mEq/L (ref 96–112)
Creatinine, Ser: 1.41 mg/dL — ABNORMAL HIGH (ref 0.50–1.10)
GFR calc Af Amer: 45 mL/min — ABNORMAL LOW (ref >90)
GFR, EST NON AFRICAN AMERICAN: 38 mL/min — AB (ref >90)
GLUCOSE: 184 mg/dL — AB (ref 70–99)
POTASSIUM: 3.3 meq/L — AB (ref 3.7–5.3)
Sodium: 145 mEq/L (ref 137–147)

## 2013-10-03 LAB — CBC
HEMATOCRIT: 26 % — AB (ref 36.0–46.0)
HEMOGLOBIN: 8.2 g/dL — AB (ref 12.0–15.0)
MCH: 29.4 pg (ref 26.0–34.0)
MCHC: 31.5 g/dL (ref 30.0–36.0)
MCV: 93.2 fL (ref 78.0–100.0)
Platelets: 178 10*3/uL (ref 150–400)
RBC: 2.79 MIL/uL — ABNORMAL LOW (ref 3.87–5.11)
RDW: 16.4 % — AB (ref 11.5–15.5)
WBC: 11.1 10*3/uL — ABNORMAL HIGH (ref 4.0–10.5)

## 2013-10-03 LAB — GLUCOSE, CAPILLARY
Glucose-Capillary: 180 mg/dL — ABNORMAL HIGH (ref 70–99)
Glucose-Capillary: 193 mg/dL — ABNORMAL HIGH (ref 70–99)
Glucose-Capillary: 195 mg/dL — ABNORMAL HIGH (ref 70–99)
Glucose-Capillary: 200 mg/dL — ABNORMAL HIGH (ref 70–99)
Glucose-Capillary: 208 mg/dL — ABNORMAL HIGH (ref 70–99)
Glucose-Capillary: 216 mg/dL — ABNORMAL HIGH (ref 70–99)

## 2013-10-03 LAB — CBC WITH DIFFERENTIAL/PLATELET
BASOS ABS: 0.1 10*3/uL (ref 0.0–0.1)
Basophils Absolute: 0 10*3/uL (ref 0.0–0.1)
Basophils Relative: 1 % (ref 0–1)
Basophils Relative: 0 % (ref 0–1)
EOS PCT: 2 % (ref 0–5)
EOS PCT: 1 % (ref 0–5)
Eosinophils Absolute: 0.2 10*3/uL (ref 0.0–0.7)
Eosinophils Absolute: 0.1 10*3/uL (ref 0.0–0.7)
HCT: 21.5 % — ABNORMAL LOW (ref 36.0–46.0)
HCT: 21.8 % — ABNORMAL LOW (ref 36.0–46.0)
Hemoglobin: 6.6 g/dL — CL (ref 12.0–15.0)
Hemoglobin: 6.8 g/dL — CL (ref 12.0–15.0)
LYMPHS ABS: 1.4 10*3/uL (ref 0.7–4.0)
LYMPHS PCT: 11 % — AB (ref 12–46)
Lymphocytes Relative: 13 % (ref 12–46)
Lymphs Abs: 1.2 10*3/uL (ref 0.7–4.0)
MCH: 28.4 pg (ref 26.0–34.0)
MCH: 28.6 pg (ref 26.0–34.0)
MCHC: 30.7 g/dL (ref 30.0–36.0)
MCHC: 31.2 g/dL (ref 30.0–36.0)
MCV: 92.7 fL (ref 78.0–100.0)
MCV: 91.6 fL (ref 78.0–100.0)
Monocytes Absolute: 0.8 10*3/uL (ref 0.1–1.0)
Monocytes Absolute: 0.8 10*3/uL (ref 0.1–1.0)
Monocytes Relative: 7 % (ref 3–12)
Monocytes Relative: 8 % (ref 3–12)
NEUTROS PCT: 77 % (ref 43–77)
NEUTROS PCT: 79 % — AB (ref 43–77)
Neutro Abs: 8.3 10*3/uL — ABNORMAL HIGH (ref 1.7–7.7)
Neutro Abs: 8.1 10*3/uL — ABNORMAL HIGH (ref 1.7–7.7)
PLATELETS: 191 10*3/uL (ref 150–400)
Platelets: 175 10*3/uL (ref 150–400)
RBC: 2.32 MIL/uL — AB (ref 3.87–5.11)
RBC: 2.38 MIL/uL — ABNORMAL LOW (ref 3.87–5.11)
RDW: 16.5 % — AB (ref 11.5–15.5)
RDW: 16.5 % — ABNORMAL HIGH (ref 11.5–15.5)
WBC: 10.8 10*3/uL — AB (ref 4.0–10.5)
WBC: 10.2 10*3/uL (ref 4.0–10.5)

## 2013-10-03 LAB — CULTURE, RESPIRATORY: Special Requests: NORMAL

## 2013-10-03 LAB — CULTURE, RESPIRATORY W GRAM STAIN

## 2013-10-03 LAB — POCT I-STAT, CHEM 8
BUN: 16 mg/dL (ref 6–23)
CREATININE: 1.8 mg/dL — AB (ref 0.50–1.10)
Calcium, Ion: 1.08 mmol/L — ABNORMAL LOW (ref 1.13–1.30)
Chloride: 105 mEq/L (ref 96–112)
GLUCOSE: 166 mg/dL — AB (ref 70–99)
HCT: 22 % — ABNORMAL LOW (ref 36.0–46.0)
HEMOGLOBIN: 7.5 g/dL — AB (ref 12.0–15.0)
Potassium: 3.4 mEq/L — ABNORMAL LOW (ref 3.7–5.3)
SODIUM: 142 meq/L (ref 137–147)
TCO2: 28 mmol/L (ref 0–100)

## 2013-10-03 LAB — PREPARE RBC (CROSSMATCH)

## 2013-10-03 LAB — MISCELLANEOUS TEST

## 2013-10-03 MED ORDER — INSULIN ASPART 100 UNIT/ML ~~LOC~~ SOLN
0.0000 [IU] | SUBCUTANEOUS | Status: DC
  Administered 2013-10-03 (×2): 3 [IU] via SUBCUTANEOUS
  Administered 2013-10-03: 5 [IU] via SUBCUTANEOUS
  Administered 2013-10-04: 3 [IU] via SUBCUTANEOUS
  Administered 2013-10-04 (×2): 2 [IU] via SUBCUTANEOUS
  Administered 2013-10-04 (×2): 3 [IU] via SUBCUTANEOUS
  Administered 2013-10-04: 5 [IU] via SUBCUTANEOUS
  Administered 2013-10-05: 3 [IU] via SUBCUTANEOUS
  Administered 2013-10-05: 2 [IU] via SUBCUTANEOUS
  Administered 2013-10-05: 3 [IU] via SUBCUTANEOUS

## 2013-10-03 MED ORDER — SODIUM CHLORIDE 0.9 % IV SOLN: Freq: Once | INTRAVENOUS | Status: DC

## 2013-10-03 MED ORDER — POTASSIUM CHLORIDE 20 MEQ/15ML (10%) PO LIQD
20.0000 meq | ORAL | Status: AC
  Administered 2013-10-03 (×2): 20 meq
  Filled 2013-10-03 (×2): qty 15

## 2013-10-03 MED ORDER — FREE WATER
200.0000 mL | Freq: Three times a day (TID) | Status: DC
  Administered 2013-10-03 – 2013-10-05 (×6): 200 mL

## 2013-10-03 MED ORDER — INSULIN GLARGINE 100 UNIT/ML ~~LOC~~ SOLN
20.0000 [IU] | Freq: Every day | SUBCUTANEOUS | Status: DC
  Administered 2013-10-03 – 2013-10-04 (×2): 20 [IU] via SUBCUTANEOUS
  Filled 2013-10-03 (×3): qty 0.2

## 2013-10-03 NOTE — Progress Notes (Signed)
CRITICAL VALUE ALERT  Critical value received:  Hgb 6.8  Date of notification:  10/03/2013  Time of notification:  05:10  Critical value read back:No. Critical not called  Nurse who received alert:  BShepherd, RN found in result review  MD notified (1st page):  Deterding, E.  Time of first page:  05:20  MD notified (2nd page):  Time of second page:  Responding MD:  Deterding, E.  Time MD responded:  05:20

## 2013-10-03 NOTE — Progress Notes (Signed)
EEG completed; results pending.    

## 2013-10-03 NOTE — Progress Notes (Addendum)
NEURO HOSPITALIST PROGRESS NOTE   SUBJECTIVE:                                                                                                                        Still requiring propofol and PRN fentanyl/midazolam, intubated on the vent, without new neurological developments. PLEX 2/5 days. CSF paraneoplastic/autoimmune profile pending.  OBJECTIVE:                                                                                                                           Vital signs in last 24 hours: Temp:  [98.2 F (36.8 C)-99.2 F (37.3 C)] 98.2 F (36.8 C) (09/14 0750) Pulse Rate:  [64-95] 83 (09/14 0815) Resp:  [15-26] 16 (09/14 0815) BP: (112-171)/(57-137) 148/74 mmHg (09/14 0815) SpO2:  [98 %-100 %] 100 % (09/14 0815) FiO2 (%):  [40 %] 40 % (09/14 0816) Weight:  [86.6 kg (190 lb 14.7 oz)-87.4 kg (192 lb 10.9 oz)] 87.4 kg (192 lb 10.9 oz) (09/14 0400)  Intake/Output from previous day: 09/13 0701 - 09/14 0700 In: 3894.9 [I.V.:764.9; Blood:350; NG/GT:1590; IV Piggyback:1190] Out: 2878 [Urine:1695] Intake/Output this shift: Total I/O In: 40 [I.V.:10; NG/GT:30] Out: 60 [Urine:60] Nutritional status:    Past Medical History  Diagnosis Date  . Hypertension   . Diabetes mellitus   . Hyperlipidemia   . History of noncompliance with medical treatment   . Shingles 08/18/2013   Neurologic Exam:  On Propofol, intubated on he vent.  Mental status: unresponsive to verbal commands but then exhibits spontaneous choreiform movements mainly head and arms.  CN 2-12: pupils 2 mm, reactive. No gaze preference. Doesn't blink to threat. Tongue: intubated  Motor: choreiform movements involving head, arms, legs.  Sensory: reacts to pain.  DTR's: 1 all over.  Plantars: downgoing.  Coordination and gait: unable to test   Lab Results: Lab Results  Component Value Date/Time   CHOL 287* 08/18/2013  2:44 AM   Lipid Panel  Recent Labs   10/01/13 0430  TRIG 128    Studies/Results: Dg Chest Port 1 View  10/03/2013   CLINICAL DATA:  Assess endotracheal tube positioning  EXAM: PORTABLE CHEST - 1 VIEW  COMPARISON:  Portable chest x-ray of October 02, 2013  FINDINGS: The  lungs remain hypoinflated. The interstitial markings remain coarse especially at the right lung base and diffusely on the left. The cardiopericardial silhouette is top-normal in size. The central pulmonary vascularity is prominent. The hemidiaphragms are obscured likely due to small pleural effusions layering posteriorly.  The endotracheal tube tip lies 3.4 cm above the crotch of the carina. The esophagogastric tube -feeding tube projects off the inferior margin of the image. The large caliber right internal jugular venous catheter tip lies in the midportion of the SVC. The PICC line tip also lies in the midportion of the SVC.  IMPRESSION: The support tubes and lines are in appropriate position. There has been mild interval worsening in the appearance of the pulmonary interstitium especially on the left consistent with CHF.   Electronically Signed   By: David  Martinique   On: 10/03/2013 07:24   Dg Chest Port 1 View  10/02/2013   CLINICAL DATA:  Evaluate endotracheal tube placement.  EXAM: PORTABLE CHEST - 1 VIEW  COMPARISON:  Chest x-ray 10/01/2013.  FINDINGS: An endotracheal tube is in place with tip 4.4 cm above the carina. Right internal jugular Vas-Cath with tip terminating in the distal superior vena cava. There is a right upper extremity PICC with tip terminating in the distal superior vena cava. A feeding tube is seen extending into the abdomen, however, the tip of the feeding tube extends below the lower margin of the image. Lung volumes are low. Bibasilar opacities may reflect areas of atelectasis and/or consolidation. Crowding of the pulmonary vasculature, accentuated by low lung volumes. Probable trace bilateral pleural effusions. Heart size is borderline enlarged.  Upper mediastinal contours are distorted by patient positioning. Atherosclerosis in the thoracic aorta.  IMPRESSION: 1. Support apparatus, as above. 2. Low lung volumes with bibasilar atelectasis and/or consolidation and superimposed trace bilateral pleural effusions. 3. Pulmonary venous congestion, without frank pulmonary edema.   Electronically Signed   By: Vinnie Langton M.D.   On: 10/02/2013 08:03    MEDICATIONS                                                                                                                        Scheduled: . antiseptic oral rinse  7 mL Mouth Rinse QID  . aspirin  81 mg Per Tube Daily  . cefTRIAXone (ROCEPHIN)  IV  1 g Intravenous Q24H  . chlorhexidine  15 mL Mouth Rinse BID  . clonazePAM  1 mg Per Tube TID  . famotidine  20 mg Per Tube QHS  . heparin  5,000 Units Subcutaneous 3 times per day  . insulin aspart  0-9 Units Subcutaneous 6 times per day  . insulin glargine  20 Units Subcutaneous QHS  . metoprolol tartrate  50 mg Per Tube BID  . potassium chloride  20 mEq Per Tube Q4H  . sodium chloride  10-40 mL Intracatheter Q12H  . tetrahydrozoline  2 drop Both Eyes BID  . thiamine  100 mg Per Tube Daily  . 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  Empiric trial 5 days PLEX while awaiting paraneoplastic/autoimmune CSF testing to come back. 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 It is also prudent to repeat EEG and also brain MRI searching for cortical and thalamic involvement as seen in sporadic CJD. Will continue to follow.  Dorian Pod,  MD [facility] [phone]  10/03/2013, 8:54 AM

## 2013-10-03 NOTE — Progress Notes (Signed)
CARE MANAGEMENT NOTE 10/03/2013  Patient:  [name], [name]   Account Number:  [id number]  Date Initiated:  09/17/2013  Documentation initiated by:  [facility]  Subjective/Objective Assessment:   Admitted with continuuous jerking movement.  Also septic.     Action/Plan:   Anticipated DC Date:  10/02/2013   Anticipated DC Plan:  [place]  CM consult      Choice offered to / List presented to:             Status of service:  Completed, signed off Medicare Important Message given?   (If response is "NO", the following Medicare IM given date fields will be blank) Date Medicare IM given:   Medicare IM given by:   Date Additional Medicare IM given:   Additional Medicare IM given by:    Discharge Disposition:    Per UR Regulation:  Reviewed for med. necessity/level of care/duration of stay  If discussed at [place] of Stay Meetings, dates discussed:   09/22/2013    Comments:  [id number] Peytin Dechert,RN,BSN,CCM: patient reintubated on [id number] sedated/hgb 6.0/1 unit prbc given/trial extubation pending may need trach needs reintubations/Chorea movement increase with lower sedation/mri of brain and eeg pending at time of reivew.  Contact:  Shanae, Luo Spouse [phone]                 P[place],Holly Daughter     [phone]

## 2013-10-03 NOTE — Progress Notes (Signed)
CRITICAL VALUE ALERT  Critical value received:  Tracheal aspirate positive MRSA  Date of notification:  10/03/2013  Time of notification:  0955  Critical value read back:Yes.    Nurse who received alert:  Elliot Gurney  MD notified (1st page):  Merton Border  Time of first page:  [phone]  MD notified (2nd page):  Time of second page:  Responding MD:  Merton Border  Time MD responded:  [phone]

## 2013-10-03 NOTE — Procedures (Signed)
ELECTROENCEPHALOGRAM REPORT  Date of Study: 10/03/2013  Patient's Name: ANALEYA LUALLEN MRN: [id number] Date of Birth: [birth year]  Referring Provider: Dr. Dorian Pod  Clinical History: This is a 63 y/o with chorea of unclear etiology. Empiric trial 5 days PLEX while awaiting paraneoplastic/autoimmune CSF testing to come back.   Medications: aspirin chewable tablet 81 mg  clonazePAM (KLONOPIN) tablet 1 mg  famotidine (PEPCID) 40 MG/5ML suspension 20 mg  fentaNYL (SUBLIMAZE) injection 25-100 mcg  Propofol 30 mcg/kg metoprolol tartrate (LOPRESSOR) 25 mg/10 mL oral suspension 50 mg  thiamine (VITAMIN B-1) tablet 100 mg  vancomycin (VANCOCIN) IVPB 750 mg/150 ml premix   Technical Summary: A multichannel digital EEG recording measured by the international 10-20 system with electrodes applied with paste and impedances below 5000 ohms performed as portable with EKG monitoring in an intubated and unresponsive patient.  Hyperventilation and photic stimulation were not performed.  The digital EEG was referentially recorded, reformatted, and digitally filtered in a variety of bipolar and referential montages for optimal display.   Description: The patient is intubated and sedated on Propofol during the recording. There is no clear posterior dominant rhythm.  The background consists of diffuse low voltage 7-8 Hz activity and occasional 2-3 Hz delta slowing consistent with sleep architecture with poorly formed vertex waves and poorly formed sleep spindles.  There is minimal reactivity to noxious stimulation.  There were no epileptiform discharges or electrographic seizures seen.    EKG lead was unremarkable.  Impression: This EEG is abnormal due to moderate diffuse slowing of the background with minimal reactivity to noxious stimulation.  Clinical Correlation of the above findings indicates diffuse cerebral dysfunction that is non-specific in etiology and can be seen with hypoxic/ischemic  injury, toxic/metabolic encephalopathies, or sedating medication effect.    Ellouise Newer, M.D.

## 2013-10-03 NOTE — Progress Notes (Signed)
PULMONARY / CRITICAL CARE MEDICINE  Name: [name] MRN: [id number] DOB: 1950-04-03    ADMISSION DATE:  09/08/2013 CONSULTATION DATE:  09/19/2013  REFERRING MD :  Thereasa Solo   CHIEF COMPLAINT:  Concern for airway protection   INITIAL PRESENTATION: 63 yo with three week history of choreiform movements presents 8/28 with worsening involuntary movements, metabolic acidosis, rhabdomyolysis  and AKI.   STUDIES / EVENTS:  8/28  Admitted 8/31  MRI brain: bilateral basal ganglial altered signal intensity of uncertain etiology 6/38  Intubated in MRI secondary to oversedation, extubated upon arrival to ICU.  Re-intubated for "severe chorea and hypoxemic resp failure" 8/31  TTE: LVEF 45%, grade 2 DD 9/02  CT chest / abdomen / pelvis: 1.2 cm L breast mass, small bilateral effusions / atelectasis, atrophic R kidney with bilateral cysts 9/03 [place] R breast: Suspicious right breast mass 9/04 Extubated 9/05 Updated family in detail about findings and care plan 9/07 Was conversant with RN, then sudden onset of central apnea and junctional bradycardia. HR responded to atropine. Intubated for third time this hospitalization 9/07 CT head: No acute change 9/07 EEG: no epileptiform discharges 9/08 CCS consult for possible breast mass bx. Not palpable - therefore, US guided bx by IR requested.  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 Plan QOD for 5 treatments 9/11 Fever, purulent secretions. CXR from 9/10 c/w possible PNA. Cultures obtained. abx initiated 9/13 Hgb 6.6. One unit PRBCs transfused 9/14 Chorea continues when propofol decreased. Repeat MRI, EEG ordered by Neuro 9/14 one unit PRBCs ordered for Hgb 6.8  9/14 repeat EEG:  9/14 reepat MRI:   LINES/TUBES: ETT 8/31 >> 8/31, 8/31 >> 9/04, 9/07 >>  L IJ CVL 8/31 >> 9/11 R IJ HD cath 9/10 >>  RUE PICC 9/11 >>   MICRO: Urine 8/28 >>  NEG Blood 8/29 >> NEG Blood 9/11 >>  Resp 9/11 >> abundant MRSA  PCT 9/11: 0.43  9/12: 0.42    9/13: 0.35  ANTIBIOTICS: Pip-tazo 9/11 >> 9/14 Vanc 9/11 >>   SUBJ:   Chorea continues when propofol decreased. Repeat MRI, EEG ordered by Neuro   VITAL SIGNS: Temp:  [98.2 F (36.8 C)-99.2 F (37.3 C)] 98.5 F (36.9 C) (09/14 1330) Pulse Rate:  [67-95] 67 (09/14 1400) Resp:  [15-22] 16 (09/14 1400) BP: (116-171)/(51-100) 147/66 mmHg (09/14 1400) SpO2:  [98 %-100 %] 100 % (09/14 1400) FiO2 (%):  [40 %] 40 % (09/14 1400) Weight:  [87.4 kg (192 lb 10.9 oz)] 87.4 kg (192 lb 10.9 oz) (09/14 0400)  HEMODYNAMICS:   VENTILATOR SETTINGS: Vent Mode:  [-] PRVC FiO2 (%):  [40 %] 40 % Set Rate:  [16 bmp] 16 bmp Vt Set:  [480 mL] 480 mL PEEP:  [5 cmH20] 5 cmH20 Pressure Support:  [15 cmH20] 15 cmH20 Plateau Pressure:  [16 cmH20-20 cmH20] 19 cmH20 INTAKE / OUTPUT: Intake/Output     09/13 0701 - 09/14 0700 09/14 0701 - 09/15 0700   I.V. (mL/kg) 764.9 (8.8) 322.6 (3.7)   Blood 350 347.5   NG/GT 1590 340   IV Piggyback 1190    Total Intake(mL/kg) 3894.9 (44.6) 1010.1 (11.6)   Urine (mL/kg/hr) 1695 (0.8) 335 (0.5)   Stool  40 (0.1)   Total Output 1695 375   Net +2199.9 +635.1         PHYSICAL EXAMINATION: General: RASS -2 Neuro: calm on vent, no  corea HEENT: PERRL, EOMI Cardiovascular: s1 s2 Regular, no murmurs Lungs: scattered rhonchi Abdomen:  Soft, bowel sounds present Ext: Trace symmetric edema, warm  LABS: I have reviewed all of today's lab results. 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) HTN, controlled P:  Cont ASA Cont hydralazine PRN to maintain SBP < 170 mmHg Cont scheduled and PRN metoprolol  RENAL A:  AKI, resolved  Mild CKD Mild hypernatremia, resolved Recurrent hypokalemia P:   Monitor BMET intermittently Monitor I/Os Correct electrolytes as indicated  GASTROINTESTINAL A:   Dysphagia P:   SUP: famotidine Cont TFs  HEMATOLOGIC / ONC A:   R breast mass is benign ICU acquired anemia P:  DVT px: SQ heparin Monitor CBC am Transfuse 1 unit again 9/14 Consider DC SQ heparin if continues to require PRBCs  INFECTIOUS A:   MRSA PNA vs tracheobronchitis P:   Micro and abx as above  ENDOCRINE A:   DM 2, controlled P:   Cont Lantus Cont SSI  NEUROLOGIC A:   Chorea of unclear etiology Acute encephalopathy  P:   Further eval and mgmt per Neurology Cont propofol gtt Cont PRN fentanyl  Cont PRN midaz Daily WUA Cont TPE   35 mins CCM time.    Merton Border, MD Pulmonary and [place] Pager: [phone]  10/03/2013, 2:45 PM

## 2013-10-03 NOTE — Progress Notes (Signed)
MRI unable to schedule at this time. Left name and number with MRI to call back and schedule appointment

## 2013-10-03 NOTE — Progress Notes (Signed)
[facility] ADULT ICU REPLACEMENT PROTOCOL FOR AM LAB REPLACEMENT ONLY  The patient does apply for the [facility] Adult ICU Electrolyte Replacment Protocol based on the criteria listed below:   1. Is GFR >/= 40 ml/min? Yes.    Patient's GFR today is 45 2. Is urine output >/= 0.5 ml/kg/hr for the last 6 hours? Yes.   Patient's UOP is 0.5 ml/kg/hr 3. Is BUN < 60 mg/dL? Yes.    Patient's BUN today is 17 4. Abnormal electrolyte(s): K+3.3 5. Ordered repletion with: protocol 6. If a panic level lab has been reported, has the CCM MD in charge been notified? Yes.  .   Physician:  E Deterding  [name] [name] [place] 10/03/2013 5:21 AM

## 2013-10-04 LAB — TYPE AND SCREEN
ABO/RH(D): A POS
ANTIBODY SCREEN: NEGATIVE
UNIT DIVISION: 0
Unit division: 0

## 2013-10-04 LAB — CBC
HCT: 24 % — ABNORMAL LOW (ref 36.0–46.0)
Hemoglobin: 7.5 g/dL — ABNORMAL LOW (ref 12.0–15.0)
MCH: 28.3 pg (ref 26.0–34.0)
MCHC: 31.3 g/dL (ref 30.0–36.0)
MCV: 90.6 fL (ref 78.0–100.0)
Platelets: 178 10*3/uL (ref 150–400)
RBC: 2.65 MIL/uL — ABNORMAL LOW (ref 3.87–5.11)
RDW: 17 % — AB (ref 11.5–15.5)
WBC: 10.4 10*3/uL (ref 4.0–10.5)

## 2013-10-04 LAB — POCT I-STAT, CHEM 8
BUN: 16 mg/dL (ref 6–23)
CALCIUM ION: 1.17 mmol/L (ref 1.13–1.30)
Chloride: 103 mEq/L (ref 96–112)
Creatinine, Ser: 1.5 mg/dL — ABNORMAL HIGH (ref 0.50–1.10)
Glucose, Bld: 168 mg/dL — ABNORMAL HIGH (ref 70–99)
HEMATOCRIT: 24 % — AB (ref 36.0–46.0)
Hemoglobin: 8.2 g/dL — ABNORMAL LOW (ref 12.0–15.0)
Potassium: 3.1 mEq/L — ABNORMAL LOW (ref 3.7–5.3)
SODIUM: 141 meq/L (ref 137–147)
TCO2: 27 mmol/L (ref 0–100)

## 2013-10-04 LAB — ANTIPHOSPHOLIPID SYNDROME EVAL, BLD
Anticardiolipin IgA: 7 APL U/mL — ABNORMAL LOW (ref <22)
Anticardiolipin IgG: 6 GPL U/mL — ABNORMAL LOW (ref <23)
Anticardiolipin IgM: 3 MPL U/mL — ABNORMAL LOW (ref <11)
DRVVT: 43.9 secs — ABNORMAL HIGH (ref <42.9)
Lupus Anticoagulant: NOT DETECTED
PHOSPHATYDALSERINE, IGA: 4 U/mL (ref <20)
PHOSPHATYDALSERINE, IGG: 6 U/mL (ref <16)
PHOSPHATYDALSERINE, IGM: 10 U/mL (ref <22)
PTT LA: 38.7 s (ref 28.0–43.0)
dRVVT Incubated 1:1 Mix: 39.7 secs (ref <42.9)

## 2013-10-04 LAB — GLUCOSE, CAPILLARY
GLUCOSE-CAPILLARY: 150 mg/dL — AB (ref 70–99)
GLUCOSE-CAPILLARY: 132 mg/dL — AB (ref 70–99)
GLUCOSE-CAPILLARY: 164 mg/dL — AB (ref 70–99)
GLUCOSE-CAPILLARY: 165 mg/dL — AB (ref 70–99)
Glucose-Capillary: 174 mg/dL — ABNORMAL HIGH (ref 70–99)
Glucose-Capillary: 201 mg/dL — ABNORMAL HIGH (ref 70–99)

## 2013-10-04 LAB — GLUTAMIC ACID DECARBOXYLASE AUTO ABS

## 2013-10-04 LAB — BASIC METABOLIC PANEL
Anion gap: 9 (ref 5–15)
BUN: 18 mg/dL (ref 6–23)
CALCIUM: 8.1 mg/dL — AB (ref 8.4–10.5)
CO2: 29 mEq/L (ref 19–32)
CREATININE: 1.37 mg/dL — AB (ref 0.50–1.10)
Chloride: 105 mEq/L (ref 96–112)
GFR, EST AFRICAN AMERICAN: 46 mL/min — AB (ref >90)
GFR, EST NON AFRICAN AMERICAN: 40 mL/min — AB (ref >90)
Glucose, Bld: 183 mg/dL — ABNORMAL HIGH (ref 70–99)
POTASSIUM: 3.4 meq/L — AB (ref 3.7–5.3)
Sodium: 143 mEq/L (ref 137–147)

## 2013-10-04 LAB — TRIGLYCERIDES: TRIGLYCERIDES: 240 mg/dL — AB (ref <150)

## 2013-10-04 LAB — VANCOMYCIN, TROUGH: Vancomycin Tr: 25.3 ug/mL (ref 10.0–20.0)

## 2013-10-04 MED ORDER — ACETAMINOPHEN 325 MG PO TABS: 650.0000 mg | ORAL_TABLET | ORAL | Status: DC | PRN

## 2013-10-04 MED ORDER — ACD FORMULA A 0.73-2.45-2.2 GM/100ML VI SOLN
Status: AC
  Filled 2013-10-04: qty 500

## 2013-10-04 MED ORDER — ANTICOAGULANT SODIUM CITRATE 4% (200MG/5ML) IV SOLN
5.0000 mL | Freq: Once | Status: AC
  Administered 2013-10-04: 2.4 mL
  Filled 2013-10-04: qty 250

## 2013-10-04 MED ORDER — DIPHENHYDRAMINE HCL 25 MG PO CAPS: 25.0000 mg | ORAL_CAPSULE | Freq: Four times a day (QID) | ORAL | Status: DC | PRN

## 2013-10-04 MED ORDER — VANCOMYCIN HCL 500 MG IV SOLR
500.0000 mg | Freq: Two times a day (BID) | INTRAVENOUS | Status: DC
  Administered 2013-10-04 – 2013-10-05 (×2): 500 mg via INTRAVENOUS
  Filled 2013-10-04 (×4): qty 500

## 2013-10-04 MED ORDER — ACD FORMULA A 0.73-2.45-2.2 GM/100ML VI SOLN
500.0000 mL | Status: DC
  Administered 2013-10-04: 500 mL via INTRAVENOUS
  Filled 2013-10-04: qty 500

## 2013-10-04 MED ORDER — SODIUM CHLORIDE 0.9 % IV SOLN
4.0000 g | Freq: Once | INTRAVENOUS | Status: AC
  Administered 2013-10-04: 4 g via INTRAVENOUS
  Filled 2013-10-04: qty 40

## 2013-10-04 MED ORDER — CALCIUM CARBONATE ANTACID 500 MG PO CHEW
2.0000 | CHEWABLE_TABLET | ORAL | Status: AC
  Administered 2013-10-04: 400 mg via ORAL
  Filled 2013-10-04 (×4): qty 2

## 2013-10-04 NOTE — Progress Notes (Signed)
Inpatient Diabetes Program Recommendations  AACE/ADA: New Consensus Statement on Inpatient Glycemic Control (2013)  Target Ranges:  Prepandial:   less than 140 mg/dL      Peak postprandial:   less than 180 mg/dL (1-2 hours)      Critically ill patients:  140 - 180 mg/dL   Results for [name], [name] (MRN [id number]) as of 10/04/2013 13:33  Ref. Range 10/03/2013 07:48 10/03/2013 13:30 10/03/2013 16:33 10/03/2013 21:04 10/03/2013 23:57 10/04/2013 04:21 10/04/2013 07:27 10/04/2013 12:03  Glucose-Capillary Latest Range: 70-99 mg/dL 208 (H) 195 (H) 180 (H) 216 (H) 201 (H) 174 (H) 150 (H) 132 (H)   Diabetes history: DM2 Outpatient Diabetes medications: Lantus 8 units BID, Novolog 0-10 units TID  Current orders for Inpatient glycemic control: Lantus 20 units QHS, Novolog 0-15 units Q4H  Inpatient Diabetes Program Recommendations Correction (SSI): Patient has received a total of Novolog 23 units for correction over the last 24 hours. Insulin - Meal Coverage: Please consider ordering Novolog 3 units Q4H for tube feeding coverage.  Thanks, Barnie Alderman, RN, MSN, CCRN Diabetes Coordinator Inpatient Diabetes Program [phone] (Team Pager) [phone] ([place] office) [phone] [facility] office)

## 2013-10-04 NOTE — Progress Notes (Signed)
ANTIBIOTIC CONSULT NOTE - FOLLOW UP  Pharmacy Consult for vancomycin Indication: pneumonia  Labs:  Recent Labs  10/02/13 1217 10/03/13 0342 10/03/13 1430 10/04/13 0500  WBC  --  10.2 11.1* 10.4  HGB 7.5* 6.8* 8.2* 7.5*  PLT  --  175 178 178  CREATININE 1.80* 1.41*  --  1.37*   Estimated Creatinine Clearance: 47.3 ml/min (by C-G formula based on Cr of 1.37).  Recent Labs  10/04/13 0500  [place] 25.3*     Microbiology: Recent Results (from the past 720 hour(s))  URINE CULTURE     Status: None   Collection Time    09/05/13  7:55 PM      Result Value Ref Range Status   Specimen Description URINE, RANDOM   Final   Special Requests NONE   Final   Culture  Setup Time     Final   Value: 09/05/2013 23:24     Performed at [place]     Final   Value: >=100,000 COLONIES/ML     Performed at [facility]   Culture     Final   Value: KLEBSIELLA PNEUMONIAE     Performed at [facility]   Report Status 09/08/2013 FINAL   Final   Organism ID, Bacteria KLEBSIELLA PNEUMONIAE   Final  CULTURE, BLOOD (ROUTINE X 2)     Status: None   Collection Time    09/06/13  9:10 AM      Result Value Ref Range Status   Specimen Description BLOOD RIGHT ANTECUBITAL   Final   Special Requests BOTTLES DRAWN AEROBIC AND ANAEROBIC 5CC   Final   Culture  Setup Time     Final   Value: 09/06/2013 14:35     Performed at [facility]   Culture     Final   Value: NO GROWTH 5 DAYS     Performed at [facility]   Report Status 09/12/2013 FINAL   Final  CULTURE, BLOOD (ROUTINE X 2)     Status: None   Collection Time    09/06/13  9:15 AM      Result Value Ref Range Status   Specimen Description BLOOD RIGHT HAND   Final   Special Requests BOTTLES DRAWN AEROBIC ONLY [facility]   Final   Culture  Setup Time     Final   Value: 09/06/2013 14:35     Performed at [facility]   Culture     Final   Value: NO GROWTH 5 DAYS     Performed at  [facility]   Report Status 09/12/2013 FINAL   Final  CULTURE, BLOOD (ROUTINE X 2)     Status: None   Collection Time    09/15/2013  8:25 PM      Result Value Ref Range Status   Specimen Description BLOOD RIGHT FOREARM   Final   Special Requests BOTTLES DRAWN AEROBIC AND ANAEROBIC 5CC   Final   Culture  Setup Time     Final   Value: 09/17/2013 01:00     Performed at [facility]   Culture     Final   Value: NO GROWTH 5 DAYS     Performed at [facility]   Report Status 09/23/2013 FINAL   Final  CULTURE, BLOOD (ROUTINE X 2)     Status: None   Collection Time    09/01/2013  8:32 PM      Result  Value Ref Range Status   Specimen Description BLOOD HAND RIGHT   Final   Special Requests BOTTLES DRAWN AEROBIC AND ANAEROBIC 5CC   Final   Culture  Setup Time     Final   Value: 09/17/2013 01:00     Performed at [facility]   Culture     Final   Value: NO GROWTH 5 DAYS     Performed at [facility]   Report Status 09/23/2013 FINAL   Final  URINE CULTURE     Status: None   Collection Time    09/08/2013  8:46 PM      Result Value Ref Range Status   Specimen Description URINE, CATHETERIZED   Final   Special Requests CX ADDED AT 0307 ON [id number]   Final   Culture  Setup Time     Final   Value: 09/17/2013 15:23     Performed at [place]     Final   Value: NO GROWTH     Performed at [facility]   Culture     Final   Value: NO GROWTH     Performed at [facility]   Report Status 09/18/2013 FINAL   Final  MRSA PCR SCREENING     Status: None   Collection Time    09/17/13  1:35 AM      Result Value Ref Range Status   MRSA by PCR NEGATIVE  NEGATIVE Final   Comment:            The GeneXpert MRSA Assay (FDA     approved for NASAL specimens     only), is one component of a     comprehensive MRSA colonization     surveillance program. It is not     intended to diagnose MRSA     infection nor to guide or      monitor treatment for     MRSA infections.  CULTURE, RESPIRATORY (NON-EXPECTORATED)     Status: None   Collection Time    09/30/13 12:30 PM      Result Value Ref Range Status   Specimen Description TRACHEAL ASPIRATE   Final   Special Requests Normal   Final   Gram Stain     Final   Value: FEW WBC PRESENT,BOTH PMN AND MONONUCLEAR     NO SQUAMOUS EPITHELIAL CELLS SEEN     ABUNDANT GRAM POSITIVE COCCI     IN PAIRS IN CLUSTERS   Culture     Final   Value: ABUNDANT METHICILLIN RESISTANT STAPHYLOCOCCUS AUREUS     Note: RIFAMPIN AND GENTAMICIN SHOULD NOT BE USED AS SINGLE DRUGS FOR TREATMENT OF STAPH INFECTIONS. CRITICAL RESULT CALLED TO, READ BACK BY AND VERIFIED WITH: [place] RN 10AM 10/03/13  [facility]   Report Status 10/03/2013 FINAL   Final   Organism ID, Bacteria METHICILLIN RESISTANT STAPHYLOCOCCUS AUREUS   Final  CULTURE, BLOOD (ROUTINE X 2)     Status: None   Collection Time    09/30/13  1:10 PM      Result Value Ref Range Status   Specimen Description BLOOD HEMODIALYSIS CATHETER   Final   Special Requests BOTTLES DRAWN AEROBIC AND ANAEROBIC 5CC   Final   Culture  Setup Time     Final   Value: 09/30/2013 17:25     Performed at [facility]   Culture     Final   Value:  BLOOD CULTURE RECEIVED NO GROWTH TO DATE CULTURE WILL BE HELD FOR 5 DAYS BEFORE ISSUING A FINAL NEGATIVE REPORT     Performed at [facility]   Report Status PENDING   Incomplete     Assessment: 63yo female supratherapeutic on vancomycin with initial dosing for MRSA on trach aspirate.  Goal of Therapy:  Vancomycin trough level 15-20 mcg/ml  Plan:  RN hung dose after lab drawn; with next dose will change to vancomycin 500mg  IV Q12H for calculated trough of ~16 and continue to monitor.  Wynona Neat, PharmD, BCPS  10/04/2013,6:05 AM

## 2013-10-04 NOTE — Progress Notes (Signed)
PULMONARY / CRITICAL CARE MEDICINE  Name: NAW LASALA MRN: [id number] DOB: 1950/06/19    ADMISSION DATE:  08/22/2013 CONSULTATION DATE:  08/31/2013  REFERRING MD :  Thereasa Solo   CHIEF COMPLAINT:  Concern for airway protection   INITIAL PRESENTATION: 63 yo with three week history of choreiform movements presents 8/28 with worsening involuntary movements, metabolic acidosis, rhabdomyolysis  and AKI.   STUDIES / EVENTS:  8/28  Admitted 8/31  MRI brain: bilateral basal ganglial altered signal intensity of uncertain etiology 6/29  Intubated in MRI secondary to oversedation, extubated upon arrival to ICU.  Re-intubated for "severe chorea and hypoxemic resp failure" 8/31  TTE: LVEF 45%, grade 2 DD 9/02  CT chest / abdomen / pelvis: 1.2 cm L breast mass, small bilateral effusions / atelectasis, atrophic R kidney with bilateral cysts 9/03 [place] R breast: Suspicious right breast mass 9/04 Extubated 9/05 Updated family in detail about findings and care plan 9/07 Was conversant with RN, then sudden onset of central apnea and junctional bradycardia. HR responded to atropine. Intubated for third time this hospitalization 9/07 CT head: No acute change 9/07 EEG: no epileptiform discharges 9/08 CCS consult for possible breast mass bx. Not palpable - therefore, US guided bx by IR requested.  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 Plan QOD for 5 treatments 9/11 Fever, purulent secretions. CXR from 9/10 c/w possible PNA. Cultures obtained. abx initiated 9/13 Hgb 6.6. One unit PRBCs transfused 9/14 Chorea continues when propofol decreased. Repeat MRI, EEG ordered by Neuro 9/14 one unit PRBCs ordered for Hgb 6.8  9/14 repeat EEG: diffuse slowing. Non-specific c/w encephalopathy of unclear etiology  9/14 reepat MRI:    LINES/TUBES: ETT 8/31 >> 8/31, 8/31 >> 9/04, 9/07 >>  L IJ CVL 8/31 >> 9/11 R IJ  HD cath 9/10 >>  RUE PICC 9/11 >>   MICRO: Urine 8/28 >> NEG Blood 8/29 >> NEG Blood 9/11 >>  Resp 9/11 >> abundant MRSA  PCT 9/11: 0.43  9/12: 0.42    9/13: 0.35  ANTIBIOTICS: Pip-tazo 9/11 >> 9/14 Vanc 9/11 >>   SUBJ:   Chorea continues when propofol decreased. Repeat MRI, EEG ordered by Neuro. MRI still pending   VITAL SIGNS: Temp:  [98 F (36.7 C)-101 F (38.3 C)] 99.3 F (37.4 C) (09/15 1341) Pulse Rate:  [66-86] 70 (09/15 1341) Resp:  [15-20] 16 (09/15 1341) BP: (118-167)/(52-109) 145/67 mmHg (09/15 1341) SpO2:  [100 %] 100 % (09/15 1215) FiO2 (%):  [40 %] 40 % (09/15 1215) Weight:  [91.9 kg (202 lb 9.6 oz)] 91.9 kg (202 lb 9.6 oz) (09/15 0426)  HEMODYNAMICS:   VENTILATOR SETTINGS: Vent Mode:  [-] PRVC FiO2 (%):  [40 %] 40 % Set Rate:  [16 bmp] 16 bmp Vt Set:  [480 mL] 480 mL PEEP:  [5 cmH20] 5 cmH20 Plateau Pressure:  [15 cmH20-20 cmH20] 20 cmH20 INTAKE / OUTPUT: Intake/Output     09/14 0701 - 09/15 0700 09/15 0701 - 09/16 0700   I.V. (mL/kg) 845.4 (9.2) 64.2 (0.7)   Blood 347.5    NG/GT 1535 60   IV Piggyback 300    Total Intake(mL/kg) 3027.9 (32.9) 124.2 (1.4)   Urine (mL/kg/hr) 945 (0.4) 205 (0.3)   Stool 70 (0) 150 (0.2)   Total Output 1015 355   Net +2012.9 -230.8         PHYSICAL EXAMINATION: General: RASS -3  Neuro: calm on vent, does exhibit corea type movements w/ stim  HEENT: PERRL, EOMI Cardiovascular: s1 s2 Regular, no murmurs Lungs: scattered rhonchi Abdomen:  Soft, bowel sounds present Ext: Trace symmetric edema, warm  CBC Recent Labs     10/03/13  0342  10/03/13  1430  10/04/13  0500  WBC  10.2  11.1*  10.4  HGB  6.8*  8.2*  7.5*  HCT  21.8*  26.0*  24.0*  PLT  175  178  178    Coag's No results found for this basename: APTT, INR,  in the last 72 hours  BMET Recent Labs     10/02/13  0317  10/02/13  1217  10/03/13  0342  10/04/13  0500  NA  141  142  145  143  K  3.5*  3.4*  3.3*  3.4*  CL  102  105  105  105   CO2  29   --   31  29  BUN  18  16  17  18   CREATININE  1.52*  1.80*  1.41*  1.37*  GLUCOSE  147*  166*  184*  183*    Electrolytes Recent Labs     10/02/13  0317  10/03/13  0342  10/04/13  0500  CALCIUM  8.1*  8.1*  8.1*    Sepsis Markers Recent Labs     10/02/13  0317  PROCALCITON  0.35    ABG Recent Labs     10/02/13  0605  PHART  7.412  PCO2ART  46.4*  PO2ART  94.5    Liver Enzymes Recent Labs     10/02/13  0317  AST  39*  ALT  52*  ALKPHOS  277*  BILITOT  0.4  ALBUMIN  1.8*    Cardiac Enzymes No results found for this basename: TROPONINI, PROBNP,  in the last 72 hours  Glucose Recent Labs     10/03/13  1633  10/03/13  2104  10/03/13  2357  10/04/13  0421  10/04/13  0727  10/04/13  1203  GLUCAP  180*  216*  201*  174*  150*  132*    Imaging Dg Chest Port 1 View  10/03/2013   CLINICAL DATA:  Assess endotracheal tube positioning  EXAM: PORTABLE CHEST - 1 VIEW  COMPARISON:  Portable chest x-ray of October 02, 2013  FINDINGS: The lungs remain hypoinflated. The interstitial markings remain coarse especially at the right lung base and diffusely on the left. The cardiopericardial silhouette is top-normal in size. The central pulmonary vascularity is prominent. The hemidiaphragms are obscured likely due to small pleural effusions layering posteriorly.  The endotracheal tube tip lies 3.4 cm above the crotch of the carina. The esophagogastric tube -feeding tube projects off the inferior margin of the image. The large caliber right internal jugular venous catheter tip lies in the midportion of the SVC. The PICC line tip also lies in the midportion of the SVC.  IMPRESSION: The support tubes and lines are in appropriate position. There has been mild interval worsening in the appearance of the pulmonary interstitium especially on the left consistent with CHF.   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) HTN, controlled P:  Cont ASA Cont hydralazine PRN to maintain SBP < 170 mmHg Cont scheduled and PRN metoprolol  RENAL A:  AKI, resolved  Mild CKD Mild hypernatremia, resolved Recurrent hypokalemia P:   Monitor BMET intermittently Monitor I/Os Correct electrolytes as indicated  GASTROINTESTINAL A:   Dysphagia P:   SUP: famotidine Cont TFs  HEMATOLOGIC / ONC A:   R breast mass is benign ICU acquired anemia P:  DVT px: SQ heparin Transfuse for hgb < 7  Monitor CBC am Consider DC SQ heparin if continues to require PRBCs  INFECTIOUS A:   MRSA PNA vs tracheobronchitis P:   Micro and abx as above  ENDOCRINE A:   DM 2, controlled P:   Cont Lantus Cont SSI  NEUROLOGIC A:   Chorea of unclear etiology Acute encephalopathy  Continues to exhibit rhythmic repetitive movements    P:   Further eval and mgmt per Neurology Empiric trial of 5 days plasma phoresis day 3/5 F/u CSF fluid for paraneoplastic/autoimmune profile  Cont propofol gtt F/u MRI Cont PRN fentanyl  Cont PRN midaz Daily WUA  No sig progress. Cont supportive care. Await pending auto-immune and paraneoplastic eval from CSF. Looks like she will need trach.   10/04/2013, 1:59 PM  Ccm time 30 min  Lavon Paganini. Titus Mould, MD, [place] Pgr: [facility]

## 2013-10-04 NOTE — Progress Notes (Signed)
CRITICAL VALUE ALERT  Critical value received:  Vancomycin 25.3  Date of notification:  04/03/13  Time of notification:  0558  Critical value read back:Yes  Nurse who received alert:  Donalynn Furlong. Shelly Flatten, RN  MD notified (1st page):  Pharmacy  Time of first page:  [phone]  MD notified (2nd page):  Time of second page:  Responding MD:  Pharmacy  Time MD responded:  [phone]

## 2013-10-05 ENCOUNTER — Inpatient Hospital Stay (HOSPITAL_COMMUNITY): Payer: BC Managed Care – PPO

## 2013-10-05 DIAGNOSIS — J9600 Acute respiratory failure, unspecified whether with hypoxia or hypercapnia: Secondary | ICD-10-CM

## 2013-10-05 DIAGNOSIS — N183 Chronic kidney disease, stage 3 unspecified: Secondary | ICD-10-CM

## 2013-10-05 DIAGNOSIS — IMO0001 Reserved for inherently not codable concepts without codable children: Secondary | ICD-10-CM

## 2013-10-05 DIAGNOSIS — E1165 Type 2 diabetes mellitus with hyperglycemia: Secondary | ICD-10-CM

## 2013-10-05 LAB — THERAPEUTIC PLASMA EXCHANGE (BLOOD BANK)
PLASMA VOLUME NEEDED: 3100
UNIT DIVISION: 0
UNIT DIVISION: 0
UNIT DIVISION: 0
UNIT DIVISION: 0
UNIT DIVISION: 0
Unit division: 0
Unit division: 0
Unit division: 0
Unit division: 0
Unit division: 0
Unit division: 0
Unit division: 0

## 2013-10-05 LAB — COMPREHENSIVE METABOLIC PANEL
ALBUMIN: 2.2 g/dL — AB (ref 3.5–5.2)
ALT: 37 U/L — AB (ref 0–35)
AST: 34 U/L (ref 0–37)
Alkaline Phosphatase: 191 U/L — ABNORMAL HIGH (ref 39–117)
Anion gap: 8 (ref 5–15)
BUN: 18 mg/dL (ref 6–23)
CALCIUM: 8.3 mg/dL — AB (ref 8.4–10.5)
CO2: 32 mEq/L (ref 19–32)
Chloride: 103 mEq/L (ref 96–112)
Creatinine, Ser: 1.28 mg/dL — ABNORMAL HIGH (ref 0.50–1.10)
GFR calc Af Amer: 50 mL/min — ABNORMAL LOW (ref >90)
GFR calc non Af Amer: 43 mL/min — ABNORMAL LOW (ref >90)
Glucose, Bld: 174 mg/dL — ABNORMAL HIGH (ref 70–99)
Potassium: 3.3 mEq/L — ABNORMAL LOW (ref 3.7–5.3)
SODIUM: 143 meq/L (ref 137–147)
TOTAL PROTEIN: 5.3 g/dL — AB (ref 6.0–8.3)
Total Bilirubin: 0.4 mg/dL (ref 0.3–1.2)

## 2013-10-05 LAB — GLUCOSE, CAPILLARY
GLUCOSE-CAPILLARY: 160 mg/dL — AB (ref 70–99)
Glucose-Capillary: 103 mg/dL — ABNORMAL HIGH (ref 70–99)
Glucose-Capillary: 140 mg/dL — ABNORMAL HIGH (ref 70–99)
Glucose-Capillary: 192 mg/dL — ABNORMAL HIGH (ref 70–99)

## 2013-10-05 LAB — CBC
HCT: 23.3 % — ABNORMAL LOW (ref 36.0–46.0)
Hemoglobin: 7.2 g/dL — ABNORMAL LOW (ref 12.0–15.0)
MCH: 28.3 pg (ref 26.0–34.0)
MCHC: 30.9 g/dL (ref 30.0–36.0)
MCV: 91.7 fL (ref 78.0–100.0)
PLATELETS: 163 10*3/uL (ref 150–400)
RBC: 2.54 MIL/uL — ABNORMAL LOW (ref 3.87–5.11)
RDW: 16.2 % — AB (ref 11.5–15.5)
WBC: 8.9 10*3/uL (ref 4.0–10.5)

## 2013-10-05 LAB — OLIGOCLONAL BANDS, CSF + SERM

## 2013-10-05 LAB — MISCELLANEOUS TEST

## 2013-10-05 MED ORDER — MORPHINE BOLUS VIA INFUSION
5.0000 mg | INTRAVENOUS | Status: DC | PRN
  Filled 2013-10-05: qty 20

## 2013-10-05 MED ORDER — POTASSIUM CHLORIDE 20 MEQ/15ML (10%) PO LIQD
20.0000 meq | ORAL | Status: AC
  Administered 2013-10-05 (×2): 20 meq
  Filled 2013-10-05 (×3): qty 15

## 2013-10-05 MED ORDER — MIDAZOLAM BOLUS VIA INFUSION
5.0000 mg | INTRAVENOUS | Status: DC | PRN
  Filled 2013-10-05: qty 20

## 2013-10-05 MED ORDER — DEXTROSE 5 % IV SOLN
10.0000 mg/h | INTRAVENOUS | Status: DC
  Administered 2013-10-05 (×2): 10 mg/h via INTRAVENOUS
  Filled 2013-10-05 (×2): qty 10

## 2013-10-05 MED ORDER — FUROSEMIDE 10 MG/ML IJ SOLN
20.0000 mg | Freq: Two times a day (BID) | INTRAMUSCULAR | Status: DC
  Administered 2013-10-05: 20 mg via INTRAVENOUS
  Filled 2013-10-05: qty 2

## 2013-10-05 MED ORDER — SODIUM CHLORIDE 0.9 % IV SOLN
10.0000 mg/h | INTRAVENOUS | Status: DC
  Administered 2013-10-05: 2 mg/h via INTRAVENOUS
  Filled 2013-10-05: qty 10

## 2013-10-06 LAB — CULTURE, BLOOD (ROUTINE X 2): CULTURE: NO GROWTH

## 2013-10-06 LAB — MISCELLANEOUS TEST

## 2013-10-14 NOTE — Discharge Summary (Signed)
NAMELILLEE, [name] NO.:  [id number]  MEDICAL RECORD NO.:  [id number]  LOCATION:  2M05C                        FACILITY:  [facility]  PHYSICIAN:  Raylene Miyamoto, MD DATE OF BIRTH:  1950/04/18  DATE OF ADMISSION:  09/01/2013 DATE OF DISCHARGE:  10/15/13                              DISCHARGE SUMMARY   DEATH SUMMARY  This was a 63 year old African-American female with a 3-week history of choreiform movements, presented on August 28, with worsening involuntary movements, metabolic acidosis, rhabdomyolysis, and acute kidney injury. The patient initially had presented as an outpatient with some eye discomfort and then was sent to the emergency room, had an admission and workup for this, in addition to choreiform movements and then was discharged and then presented back to the hospital on August 28.  MRI of the brain showed bilateral basal ganglial altered signal intensities of uncertain etiology.  On August 31, the patient required intubation in the MRI machine secondary to sedation needs and then was extubated upon arrival to the ICU and she was re-intubated secondary to severe choreia and hypoxemic respiratory failure.  She had echocardiogram on August 31 showed ejection fraction of 45% with grade 2 diastolic dysfunction.  She had a CAT scan of the abdomen and pelvis and chest showed a 1.2-cm left breast mass, small bilateral effusions, atelectasis, atrophy of the right kidney with bilateral cysts.  She had ultrasound of the breast on the right side which showed suspicious right breast mass.  She was extubated on September 4.  She had some sudden apnea on September 7, some junctional bradycardia and was given atropine, intubated for the third time during hospitalization.  CT scan of the head on September 7 showed no acute changes.  On September 7, the EEG, no epileptiform discharges.  Had ccs surgery see the patient for breast mass, consideration for biopsy,  was not palpable, therefore no biopsy was undertaken at that time during acute illness.  On September 9, she had a core needle biopsy of the right breast lesion by IR but benign fibroadenoma.  On September 10, she had a dialysis catheter placed for therapeutic empiric plasma exchange.  On September 11 and throughout several of the days had plan for 5 treatments of her plasma exchange.  On September 11, she had some fevers, pleural secretions.  Chest x-ray with possible pneumonia.  Cultures were sent. She required blood transfusion during her hospitalization.  She was treated with propofol quite often to reduce the choreiform movements. She had a repeat EEG on September 14, which showed diffuse slowing, nonspecific with encephalopathy of unclear etiology and she had again over 3 doses of plasmapheresis without any significant improvements in her choreiform movements.  She had lumbar puncture as well.  there was all negative except for Two studies which was still pending, noninfectious etiology r.o paraneoplastic.  She had a respiratory culture  MRSA on September 11, although procalcitonin was negative. She was treated with Zosyn and vanc on September 11.  Family understood that she was going to do very poorly from this and regardless of the etiology, they understood that we did not have any kind of pathology as far as brain biopsy that  was not recommended by Dr. Wallie Char in the neurology team to have any further workup.  Her prognosis was going to be poor.  Comfort care was undertaken, the patient expired.  FINAL DIAGNOSES UPON DEATH: 1. Unclear etiology of choreiform disorder, severe, to rule out     infectious versus noninfectious etiology.r/o paraneoplastic 2. Acute respiratory failure. 3. Pneumonia. 4. Acute renal failure. 5. Systolic heart failure.  Ejection fraction 45%. 6. Diastolic heart failure with grade 2 diastolic disease. 7. Dysphagia. 8. A benign mass on the  breast.     Raylene Miyamoto, MD     DJF/MEDQ  D:  10/13/2013  T:  10/14/2013  Job:  [id number]

## 2013-10-18 LAB — MISCELLANEOUS TEST

## 2013-10-20 NOTE — Progress Notes (Signed)
Subjective: Patient remains intubated and on mechanical ventilation; sedated with propofol. No adverse clinical events overnight reported. Patient has completed 3 of 5 planned plasma exchange treatments.  Objective: Current vital signs: BP 101/64  Pulse 62  Temp(Src) 97.9 F (36.6 C) (Oral)  Resp 16  Ht 5\' 6"  (1.676 m)  Wt 93.7 kg (206 lb 9.1 oz)  BMI 33.36 kg/m2  SpO2 100%  Neurologic Exam: Patient was responsive to noxious stimuli with decerebrate posturing and head turning. Pupils were equal and react minimally to light. Extraocular movements were intact with oculocephalic maneuvers. Face was symmetrical with no signs of focal weakness. She had no spontaneous movements of extremities. Muscle tone was flaccid throughout. Deep tendon reflexes were trace only and symmetrical.  Medications: I have reviewed the patient's current medications.  Assessment/Plan: 63 year old lady with severe movement disorder of unclear etiology. MRI study 2 weeks ago showed a new development of basal ganglia signal abnormality which is not seen 1 month earlier. Repeat MRI study is pending. EEG on 10/03/2013 showed generalized nonspecific slowing. Antibody studies to rule out paraneoplastic disorder as well as 14-3-3 protein pending.  Recommend no changes in current management. We will continue to follow this patient with you.  C.R. Nicole Kindred, MD [facility] [phone]  10/28/13  9:28 AM

## 2013-10-20 NOTE — Progress Notes (Signed)
[facility] ADULT ICU REPLACEMENT PROTOCOL FOR AM LAB REPLACEMENT ONLY  The patient doesapply for the [facility] Adult ICU Electrolyte Replacment Protocol based on the criteria listed below:   1. Is GFR >/= 40 ml/min? Yes.    Patient's GFR today is 50 2. Is urine output >/= 0.5 ml/kg/hr for the last 6 hours? Yes.   Patient's UOP is 0..51 ml/kg/hr 3. Is BUN < 60 mg/dL? Yes.    Patient's BUN today is 18 4. Abnormal electrolyte(s): K+ 3.3 5. Ordered repletion with:see order 6. If a panic level lab has been reported, has the CCM MD in charge been notified? Yes.  .   Physician:  PCCM  Blair Dolphin A 10/20/2013 6:23 AM

## 2013-10-20 NOTE — Procedures (Signed)
Extubation Procedure Note  Patient Details:   Name: [name] DOB: 06-23-1950 MRN: [id number]   Airway Documentation:     Evaluation  O2 sats: transiently fell during during procedure Complications: Complications of SAT 50% Patient did not tolerate procedure well. Bilateral Breath Sounds: Diminished;Clear Suctioning: Airway No  Terminal extubation done at this time. Patient had spontaneous RR on CPAP, but none upon extubation. RN aware, and family at bedside.   Saunders Glance 2013/11/03, 12:54 PM

## 2013-10-20 NOTE — Progress Notes (Signed)
Chaplain followed up with family and remained at bedside with family until pt was pronounced.  Chaplain remained in room with family at bedside to provide spiritual and emotional support, the ministry of presence and hospitality.    25-Oct-2013 1500  Clinical Encounter Type  Visited With Patient and family together  Visit Type Follow-up;Spiritual support;Death  Spiritual Encounters  Spiritual Needs Emotional;Grief support  Stress Factors  Patient Stress Factors None identified  Family Stress Factors Health changes;Loss;Major life changes  Advance Directives (For Healthcare)  Does patient have an advance directive? No   Mertie Moores, Chaplain

## 2013-10-20 NOTE — Progress Notes (Signed)
Chaplain responded to referral and encountered pt's family upon entering the room and was immediately asked to pray.  Chaplain prayed with family then provided emotional and spiritual support as well as empathetic listening.  Will follow up as needed.  October 06, 2013 1400  Clinical Encounter Type  Visited With Patient and family together  Visit Type Initial;Spiritual support;Patient actively dying  Referral From Physician  Spiritual Encounters  Spiritual Needs Prayer;Emotional;Grief support  Stress Factors  Patient Stress Factors Exhausted;Health changes;Major life changes  Family Stress Factors Exhausted;Health changes;Loss;Major life changes  Advance Directives (For Healthcare)  Does patient have an advance directive? No   Mertie Moores, Chaplain

## 2013-10-20 NOTE — Progress Notes (Signed)
PULMONARY / CRITICAL CARE MEDICINE  Name: [name] MRN: [id number] DOB: [birth year]    ADMISSION DATE:  09/15/2013 CONSULTATION DATE:  08/31/2013  REFERRING MD :  Thereasa Solo   CHIEF COMPLAINT:  Concern for airway protection   INITIAL PRESENTATION: 63 yo with three week history of choreiform movements presents 8/28 with worsening involuntary movements, metabolic acidosis, rhabdomyolysis  and AKI.   STUDIES / EVENTS:  8/28  Admitted 8/31  MRI brain: bilateral basal ganglial altered signal intensity of uncertain etiology 6/29  Intubated in MRI secondary to oversedation, extubated upon arrival to ICU.  Re-intubated for "severe chorea and hypoxemic resp failure" 8/31  TTE: LVEF 45%, grade 2 DD 9/02  CT chest / abdomen / pelvis: 1.2 cm L breast mass, small bilateral effusions / atelectasis, atrophic R kidney with bilateral cysts 9/03 [place] R breast: Suspicious right breast mass 9/04 Extubated 9/05 Updated family in detail about findings and care plan 9/07 Was conversant with RN, then sudden onset of central apnea and junctional bradycardia. HR responded to atropine. Intubated for third time this hospitalization 9/07 CT head: No acute change 9/07 EEG: no epileptiform discharges 9/08 CCS consult for possible breast mass bx. Not palpable - therefore, US guided bx by IR requested.  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 Plan QOD for 5 treatments 9/11 Fever, purulent secretions. CXR from 9/10 c/w possible PNA. Cultures obtained. abx initiated 9/13 Hgb 6.6. One unit PRBCs transfused 9/14 Chorea continues when propofol decreased. Repeat MRI, EEG ordered by Neuro 9/14 one unit PRBCs ordered for Hgb 6.8  9/14 repeat EEG: diffuse slowing. Non-specific c/w encephalopathy of unclear etiology  9/14 reepat MRI: held 9/15 - plasma phoresis    LINES/TUBES: ETT 8/31 >> 8/31, 8/31 >> 9/04, 9/07 >>   L IJ CVL 8/31 >> 9/11 R IJ HD cath 9/10 >>  RUE PICC 9/11 >>   MICRO: Urine 8/28 >> NEG Blood 8/29 >> NEG Blood 9/11 >>> Resp 9/11 >> abundant MRSA  PCT 9/11: 0.43  9/12: 0.42    9/13: 0.35  ANTIBIOTICS: Pip-tazo 9/11 >> 9/14 Vanc 9/11 >>   SUBJ:  Plasma change done  VITAL SIGNS: Temp:  [97.9 F (36.6 C)-101 F (38.3 C)] 97.9 F (36.6 C) (09/16 0800) Pulse Rate:  [60-86] 62 (09/16 0630) Resp:  [15-20] 16 (09/16 0630) BP: (101-183)/(51-109) 101/64 mmHg (09/16 0736) SpO2:  [100 %] 100 % (09/16 0630) FiO2 (%):  [40 %] 40 % (09/16 0736) Weight:  [93.7 kg (206 lb 9.1 oz)] 93.7 kg (206 lb 9.1 oz) (09/16 0412)  HEMODYNAMICS:   VENTILATOR SETTINGS: Vent Mode:  [-] PRVC FiO2 (%):  [40 %] 40 % Set Rate:  [16 bmp] 16 bmp Vt Set:  [480 mL] 480 mL PEEP:  [5 cmH20] 5 cmH20 Plateau Pressure:  [19 cmH20-22 cmH20] 21 cmH20 INTAKE / OUTPUT: Intake/Output     09/15 0701 - 09/16 0700 09/16 0701 - 09/17 0700   I.V. (mL/kg) 743.4 (7.9)    Blood     NG/GT 150    IV Piggyback 850    Total Intake(mL/kg) 1743.4 (18.6)    Urine (mL/kg/hr) 650 (0.3)    Stool 300 (0.1)    Total Output 950     Net +793.4           PHYSICAL EXAMINATION: General: RASS -3 Neuro: calm on vent, WUA with agitation, movements noted HEENT: PERRL,  EOMI Cardiovascular: s1 s2 Regular, no murmurs Lungs: ronchi  Abdomen:  Soft, bowel sounds present, no r Ext: Trace symmetric edema, warm  CBC Recent Labs     10/03/13  1430  10/04/13  0500  10/04/13  1234  10-28-13  0435  WBC  11.1*  10.4   --   8.9  HGB  8.2*  7.5*  8.2*  7.2*  HCT  26.0*  24.0*  24.0*  23.3*  PLT  178  178   --   163    Coag's No results found for this basename: APTT, INR,  in the last 72 hours  BMET Recent Labs     10/03/13  0342  10/04/13  0500  10/04/13  1234  10-28-13  0435  NA  145  143  141  143  K  3.3*  3.4*  3.1*  3.3*  CL  105  105  103  103  CO2  31  29   --   32  BUN  17  18  16  18   CREATININE  1.41*   1.37*  1.50*  1.28*  GLUCOSE  184*  183*  168*  174*    Electrolytes Recent Labs     10/03/13  0342  10/04/13  0500  2013-10-28  0435  CALCIUM  8.1*  8.1*  8.3*    Sepsis Markers No results found for this basename: LACTICACIDVEN, PROCALCITON, O2SATVEN,  in the last 72 hours  ABG No results found for this basename: PHART, PCO2ART, PO2ART,  in the last 72 hours  Liver Enzymes Recent Labs     2013/10/28  0435  AST  34  ALT  37*  ALKPHOS  191*  BILITOT  0.4  ALBUMIN  2.2*    Cardiac Enzymes No results found for this basename: TROPONINI, PROBNP,  in the last 72 hours  Glucose Recent Labs     10/04/13  1203  10/04/13  1622  10/04/13  1943  Oct 28, 2013  0011  October 28, 2013  0343  October 28, 2013  0728  GLUCAP  132*  164*  165*  192*  160*  140*    Imaging Dg Chest Port 1 View  10/28/13   CLINICAL DATA:  Evaluate endotracheal tube, shortness of breath  EXAM: PORTABLE CHEST - 1 VIEW  COMPARISON:  10/03/2013; 10/02/2013; 10/01/2013  FINDINGS: Grossly unchanged cardiac silhouette and mediastinal contours given persistently reduced lung volumes and patient rotation. Stable position of support apparatus. No pneumothorax. The pulmonary vasculature appears indistinct with cephalization flow. Minimally improved aeration of the lungs with persistent small bilateral effusions and associated bibasilar opacities. No new focal airspace opacities. Unchanged bones.  IMPRESSION: 1.  Stable positioning of support apparatus.  No pneumothorax. 2. Minimally improved aeration along suggests resolving edema and atelectasis. 3. Grossly unchanged small effusions and associated bibasilar opacities, atelectasis versus infiltrate   Electronically Signed   By: Sandi Mariscal M.D.   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) HTN, controlled P:  Cont ASA Cont hydralazine PRN to maintain SBP < 170 mmHg, not required Cont scheduled  and PRN metoprolol  RENAL A:  AKI, resolved  Mild CKD Mild hypernatremia, resolved Recurrent hypokalemia P:   Monitor BMET intermittently Lasix Stop free water kvo k supp  GASTROINTESTINAL A:   Dysphagia P:   SUP: famotidine Hold TFs  HEMATOLOGIC / ONC A:   R breast mass is benign ICU acquired anemia, some dilition P:  DVT px: SQ heparin Transfuse for hgb < 7  Limit phlebotomy Start lasix  INFECTIOUS A:   MRSA PNA vs tracheobronchitis, favor PNA P:   Micro and abx as above Establish stop date vanc, consider 8 days then dc  ENDOCRINE A:   DM 2, controlled P: hold Lantus if T Fheld Cont SSI  NEUROLOGIC A:   Chorea of unclear etiology Acute encephalopathy  Continues to exhibit rhythmic repetitive movements    P:   Further eval and mgmt per Neurology Empiric trial of 5 days plasma phoresis day 3/5- done, will update family if extubation  ,survives then continued F/u CSF fluid for paraneoplastic/autoimmune profile  Cont propofol gtt F/u MRI, held, will d/w neuro to assess extubation success prior to this Cont PRN fentanyl  Cont PRN midaz Daily WUA  Comfort care discussion today , lasix, weaning  10-19-13, 9:14 AM  Ccm time 30 min  Lavon Paganini. Titus Mould, MD, [place] Pgr: [facility]

## 2013-10-20 NOTE — Progress Notes (Signed)
Morphine 80 mg wasted and versed 40 mg wasted in sink. Maia Petties RN witnessed waste.  Tiney Rouge RN

## 2013-10-20 NOTE — Progress Notes (Signed)
Extensive discussion with family . D/w Neuro Dr Nicole Kindred, prognosis poor any fxnal rcovery, does not advocate further testing brain bx etc. 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 We discussed the poor prognosis and likely poor quality of life. Family has decided to extubate with versed and morphine , comfort , if can breath on own and protect they would consider continued plsmaxchange and treatments, but realize this is unlikely and they wish comfort as priority.  RN Anderson Malta and Eugene Garnet present  Additional 40 min  Lavon Paganini. Titus Mould, MD, [place] Pgr: [facility]

## 2013-10-20 NOTE — Progress Notes (Signed)
Patient expired at 15:03. Tiney Rouge RN and Maia Petties RN pronounced patient. No breath or heart sounds auscultated. Pupils non-reactive. Family at bedside. Dr. Titus Mould notified. Tiney Rouge RN

## 2013-10-20 DEATH — deceased

## 2013-10-24 ENCOUNTER — Ambulatory Visit: Payer: BC Managed Care – PPO | Admitting: Internal Medicine

## 2013-11-21 ENCOUNTER — Encounter (HOSPITAL_COMMUNITY): Payer: Self-pay | Admitting: General Surgery

## 2014-09-30 IMAGING — CT CT HEAD W/O CM
2 series · 15 of 30 positions shown, 19 images · non-contrast
Comparison: 09/19/2013 brain MR. 08/17/2013 head CT.

CLINICAL DATA: 63-year-old female with diabetes, high blood
pressure and hyperlipidemia presenting with altered mental status.
Coded.

EXAM:
CT HEAD WITHOUT CONTRAST
TECHNIQUE: Contiguous axial images were obtained from the base of the skull
through the vertex without intravenous contrast.

[Series 201: head w/o, idose (1) · axial · non-contrast · 0.49mm/px · z∈[+102,+232]mm · 13 of 32 slices shown, 17 images]
[im 3/32  brain]
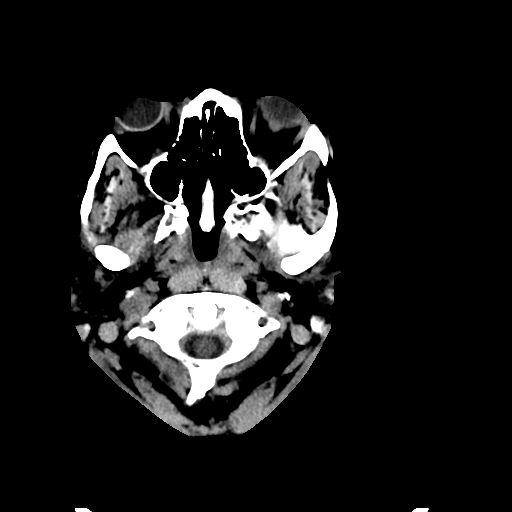
[im 3/32  bone]
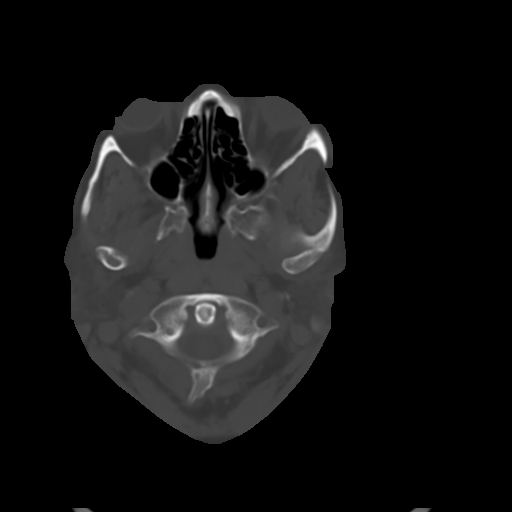
[im 5/32  brain]
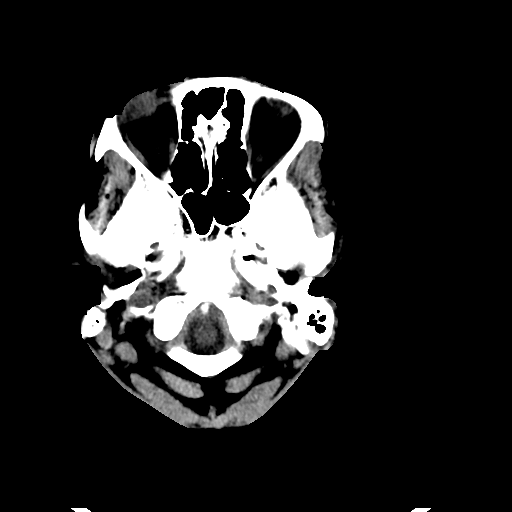
[im 7/32  brain]
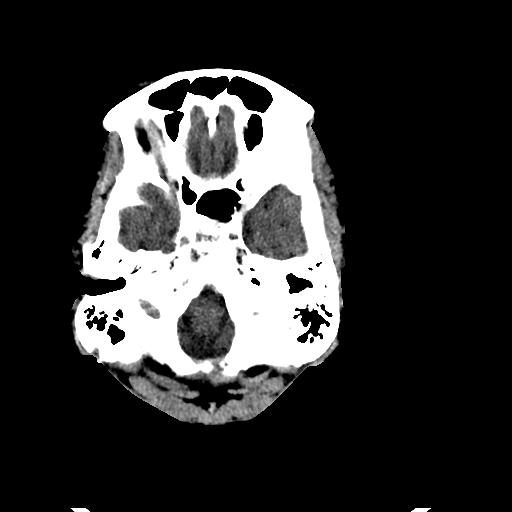
[im 9/32  brain]
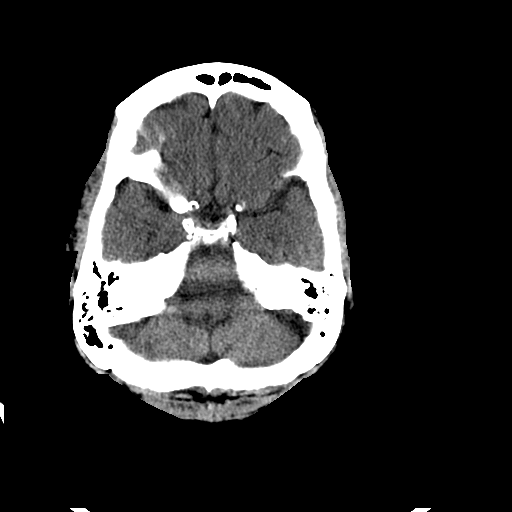
[im 12/32  brain]
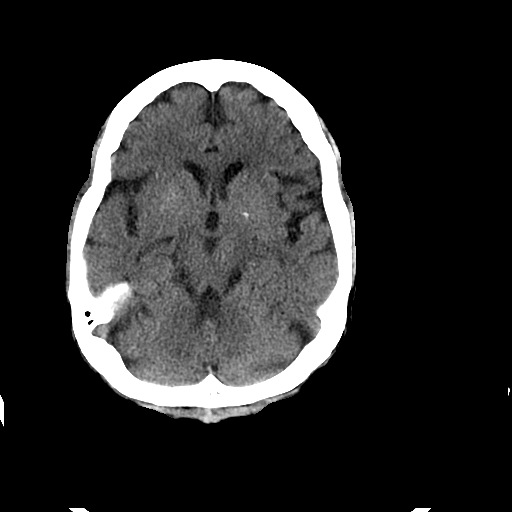
[im 12/32  bone]
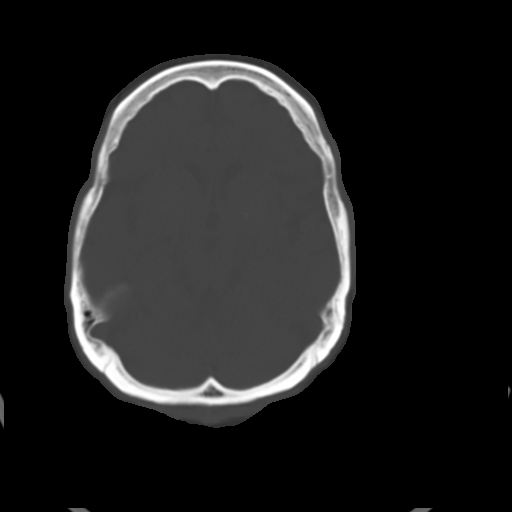
[im 14/32  brain]
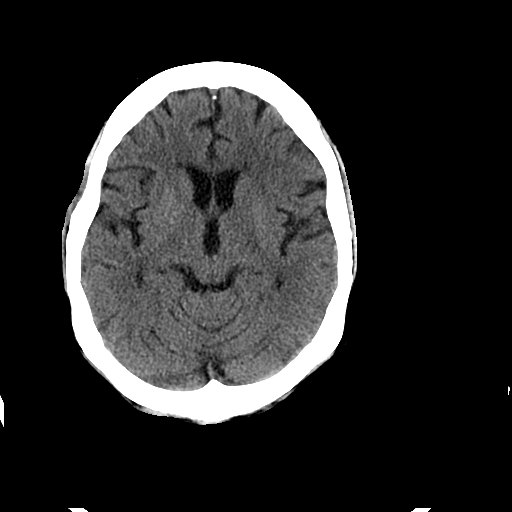
[im 16/32  brain]
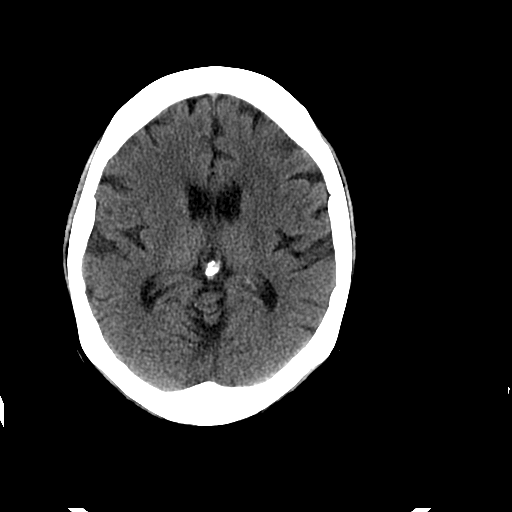
[im 18/32  brain]
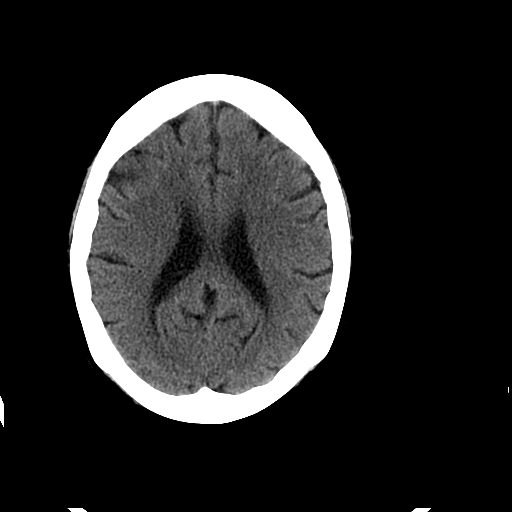
[im 20/32  brain]
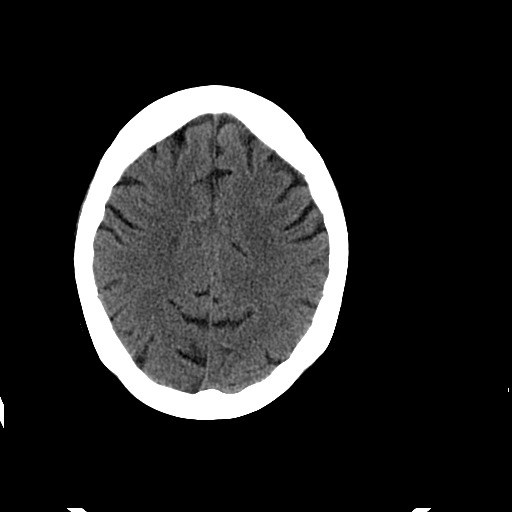
[im 20/32  bone]
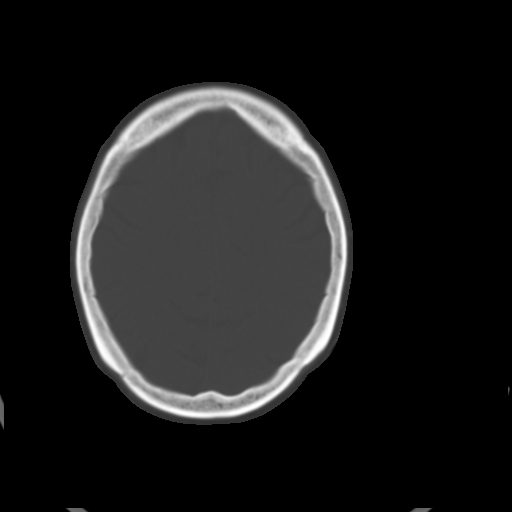
[im 23/32  brain]
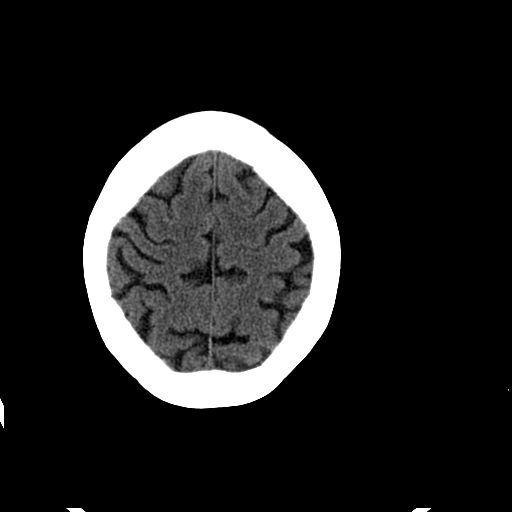
[im 25/32  brain]
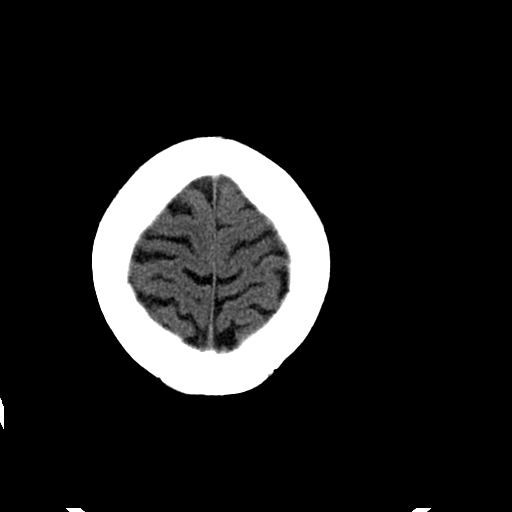
[im 27/32  brain]
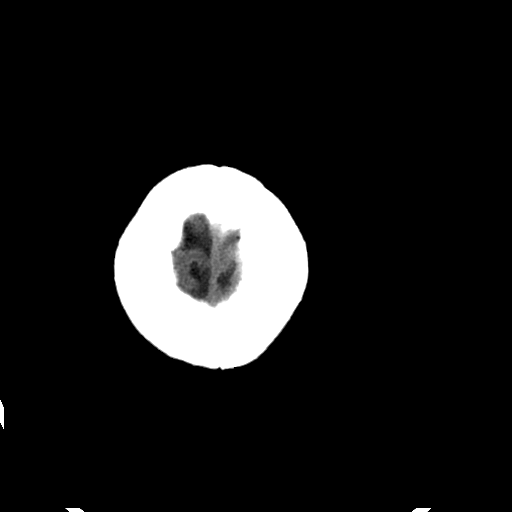
[im 29/32  brain]
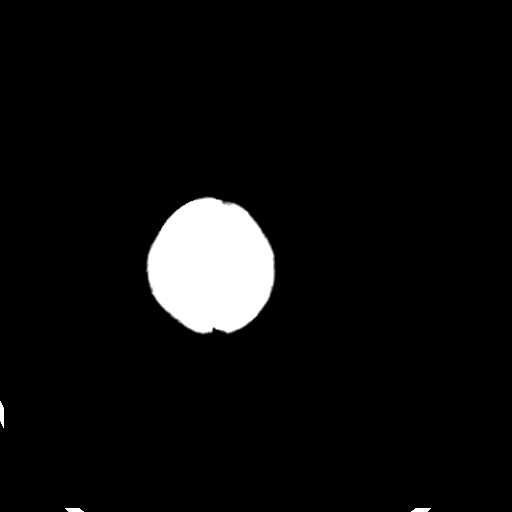
[im 29/32  bone]
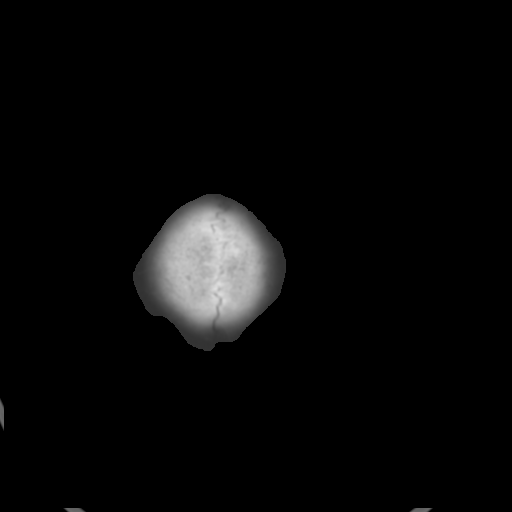

[Series 202: head w/o bone, idose (1) · axial · non-contrast · 0.49mm/px · z∈[+102,+122]mm · 2 of 32 slices shown]
[im 3/32  bone]
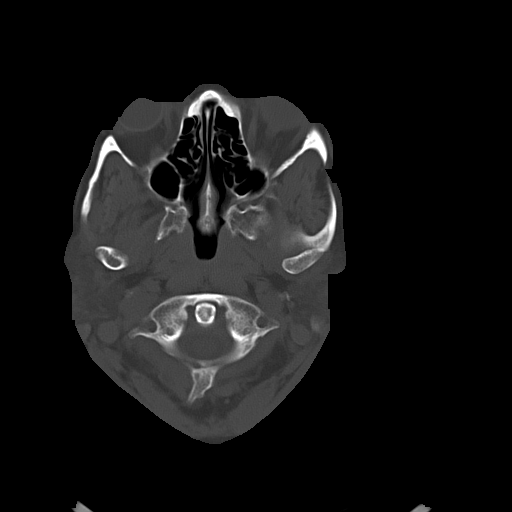
[im 7/32  bone]
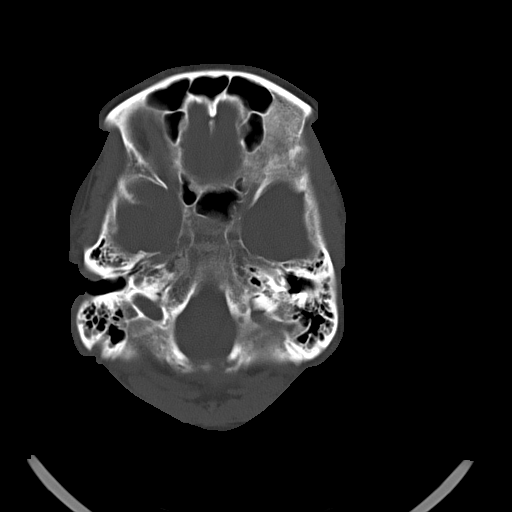

[15 of 30 positions shown; findings below may reference images not displayed]

FINDINGS: No intracranial hemorrhage or CT evidence of large acute infarct.

The basal ganglia abnormality noted on the recent MR is not as well
appreciated on present CT (CT not as sensitive for detection of such
findings). In addition to previously described considerations as
cause for basal ganglia abnormality, metabolic abnormality including
vitamin B12 deficiency can sometimes present with this MR finding.

No intracranial mass lesion noted on this unenhanced exam.

Mild global atrophy.  No hydrocephalus
IMPRESSION: No intracranial hemorrhage or CT evidence of large acute infarct.
Please see above discussion.

## 2014-10-01 IMAGING — CR DG CHEST 1V PORT
1 series · 1 of 1 positions shown · non-contrast
Comparison: 09/26/2013

CLINICAL DATA: Followup respiratory failure

EXAM:
PORTABLE CHEST - 1 VIEW

[AP]
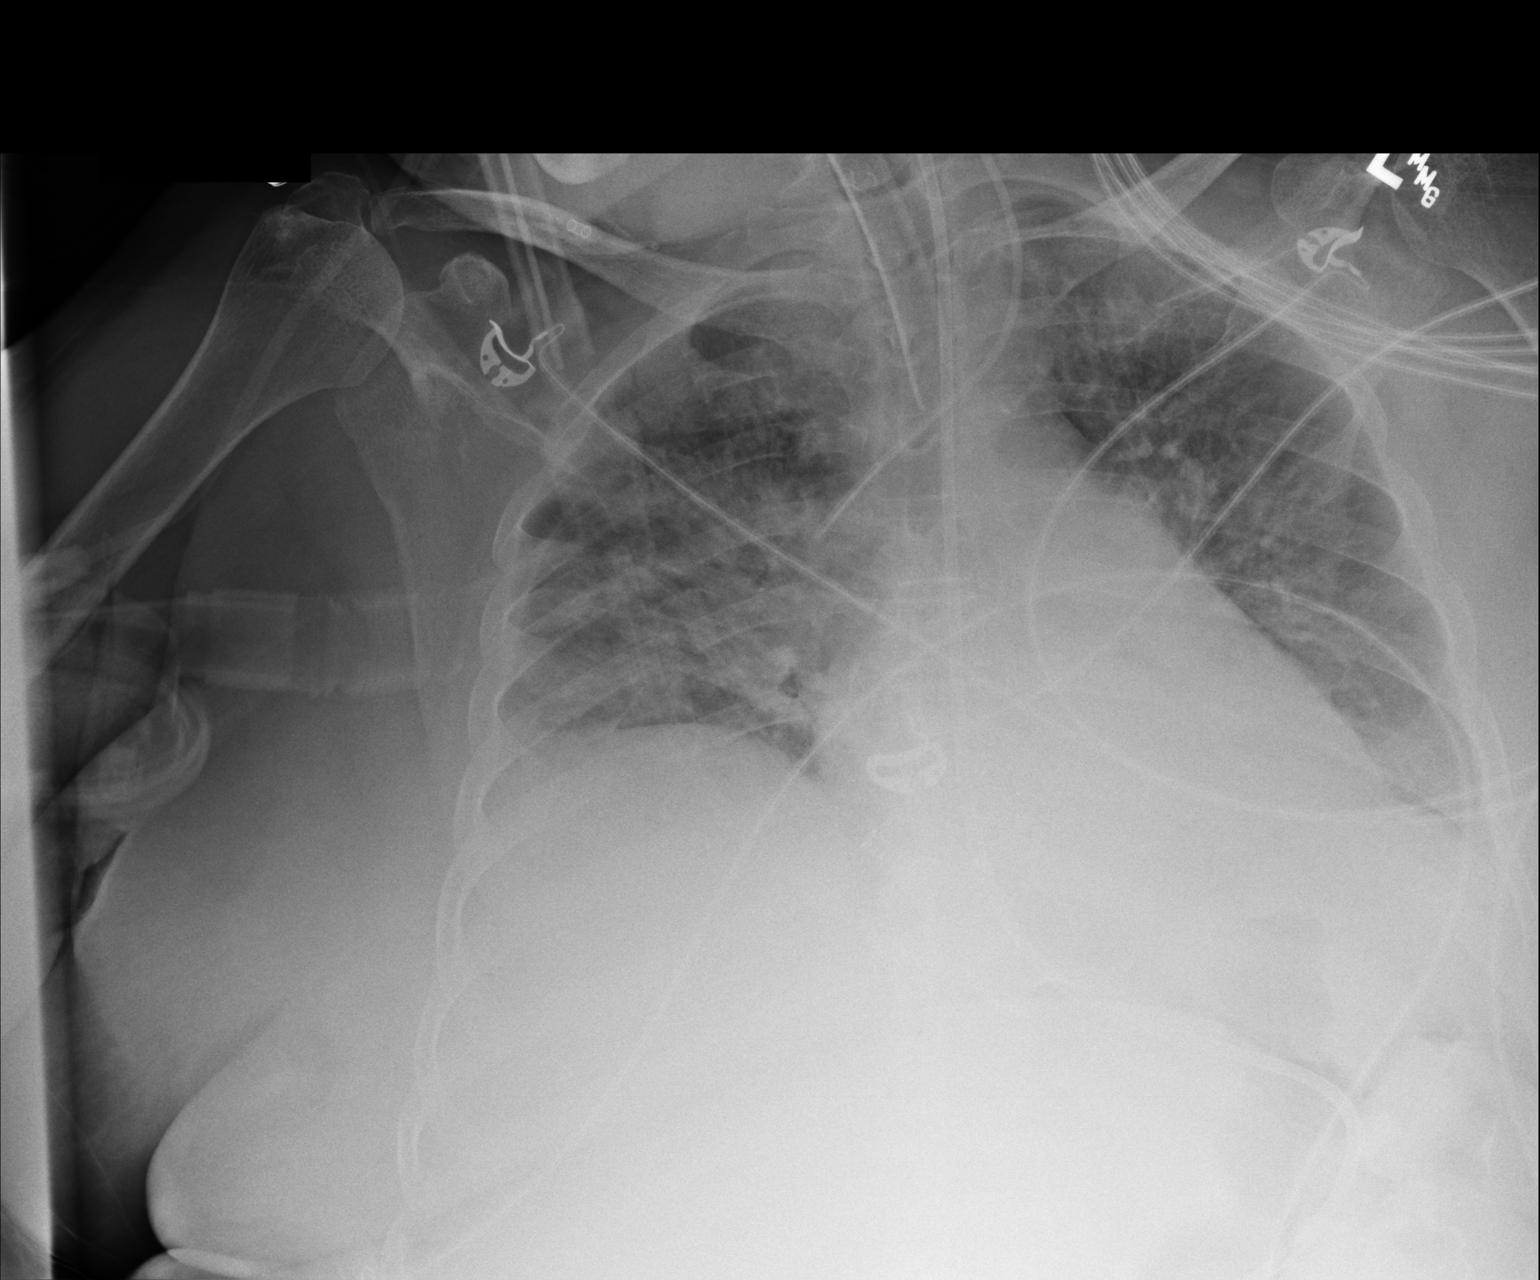

[1 of 1 positions shown; findings below may reference images not displayed]

FINDINGS: Cardiac shadow is stable. A left-sided central venous line is again
noted in the proximal superior vena cava. An endotracheal tube is
seen 3.7 cm above the carina. Feeding catheter is noted extending
into the stomach. Patchy infiltrative changes are again identified
bilaterally but overall improved when compare with the prior study.
IMPRESSION: Improved airspace disease bilaterally.

Tubes and lines as described, stable in appearance

## 2014-10-02 IMAGING — US US BIOPSY
1 series · 7 of 7 positions shown · non-contrast
Comparison: none

CLINICAL DATA: 63-year-old with a right breast nodule. Concern for
paraneoplastic syndrome.

[Series 1: us biopsy · 0.04mm/px · 7 of 7 slices shown]
[im 1/7]
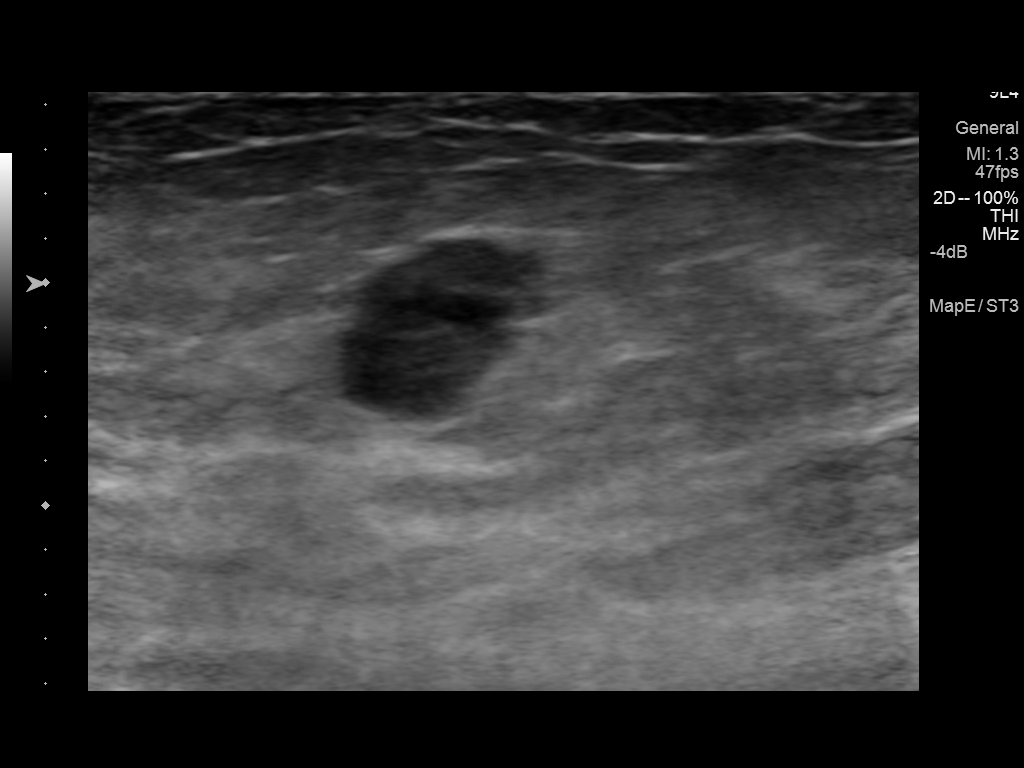
[im 2/7]
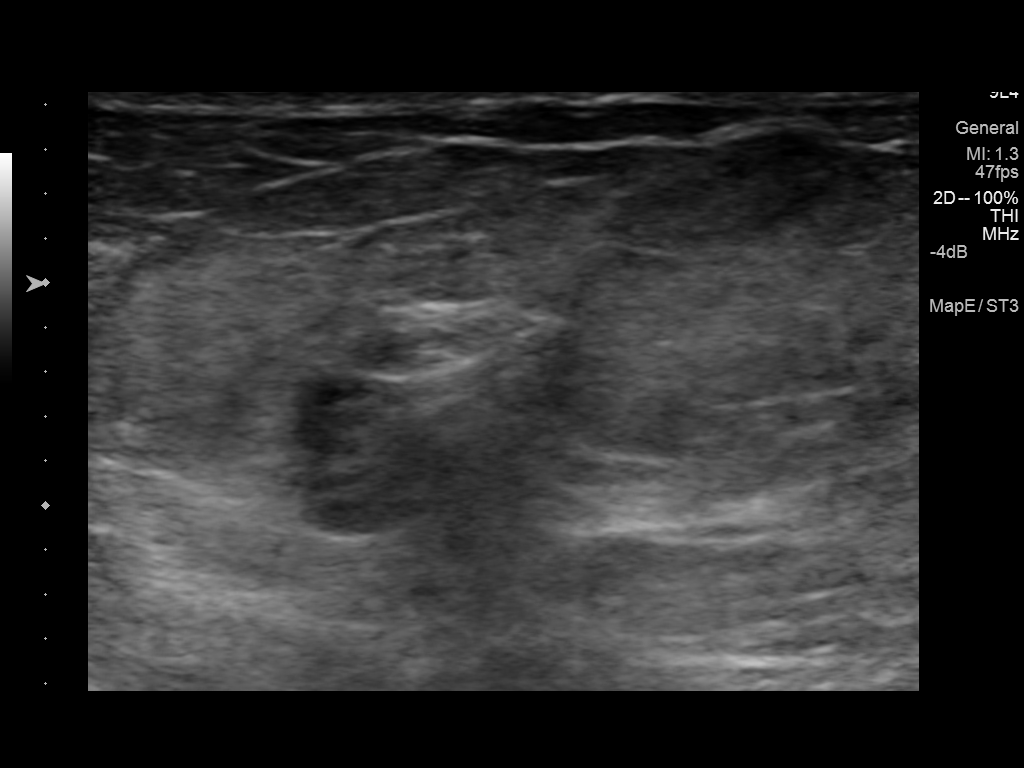
[im 3/7]
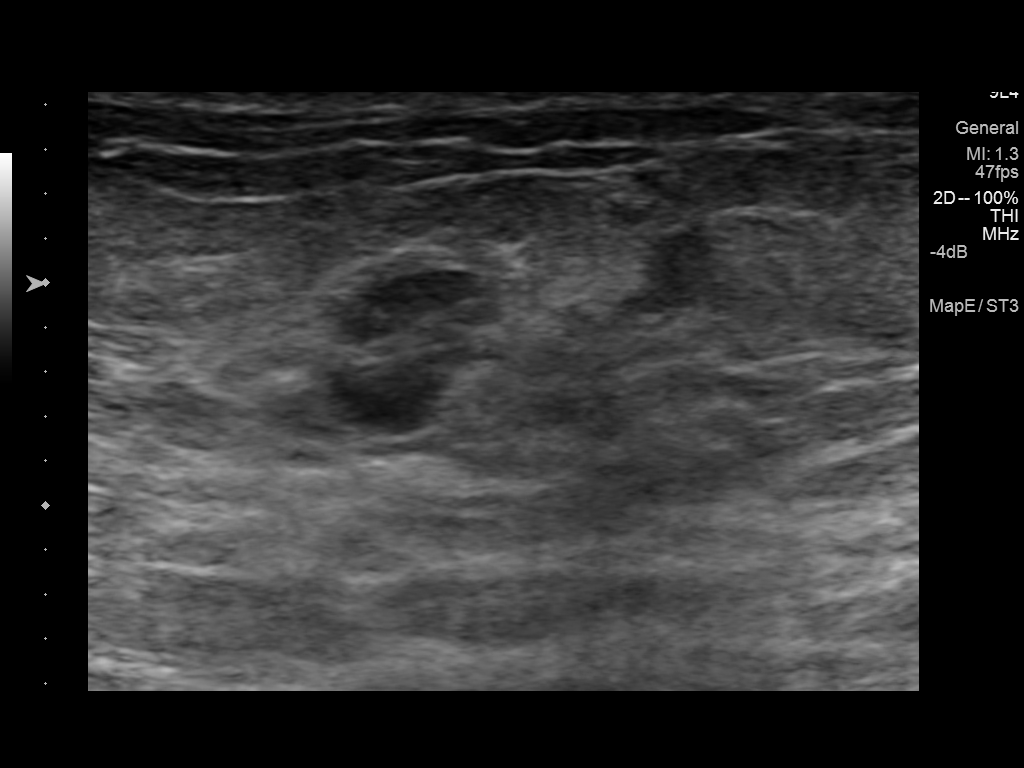
[im 4/7]
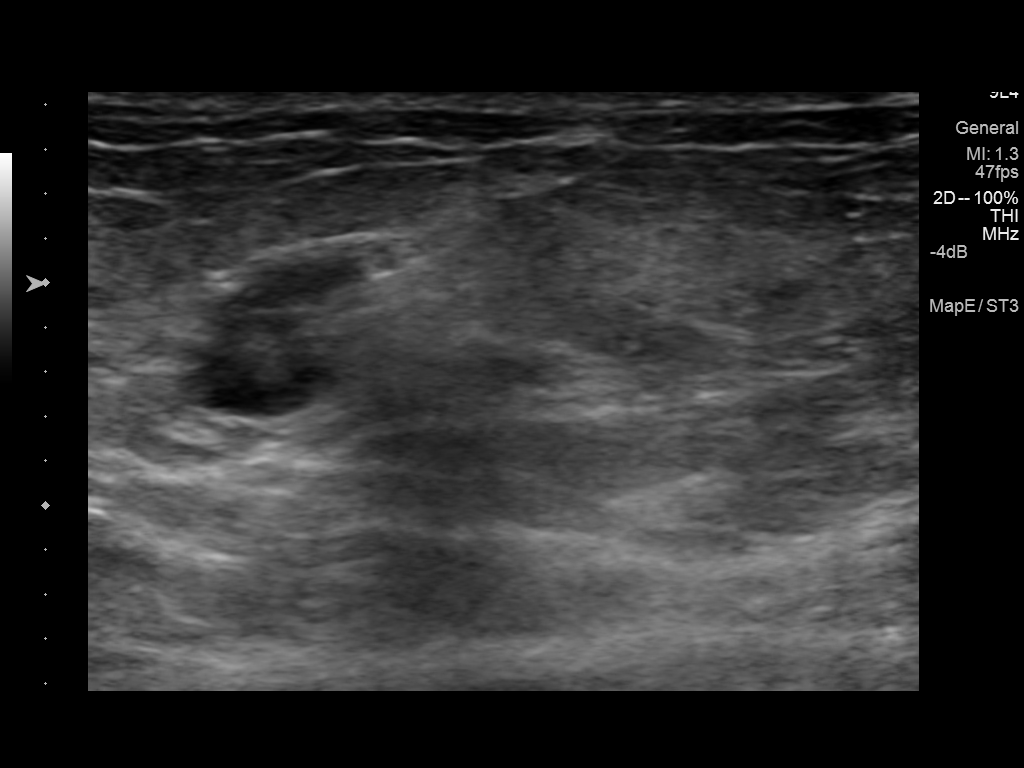
[im 5/7]
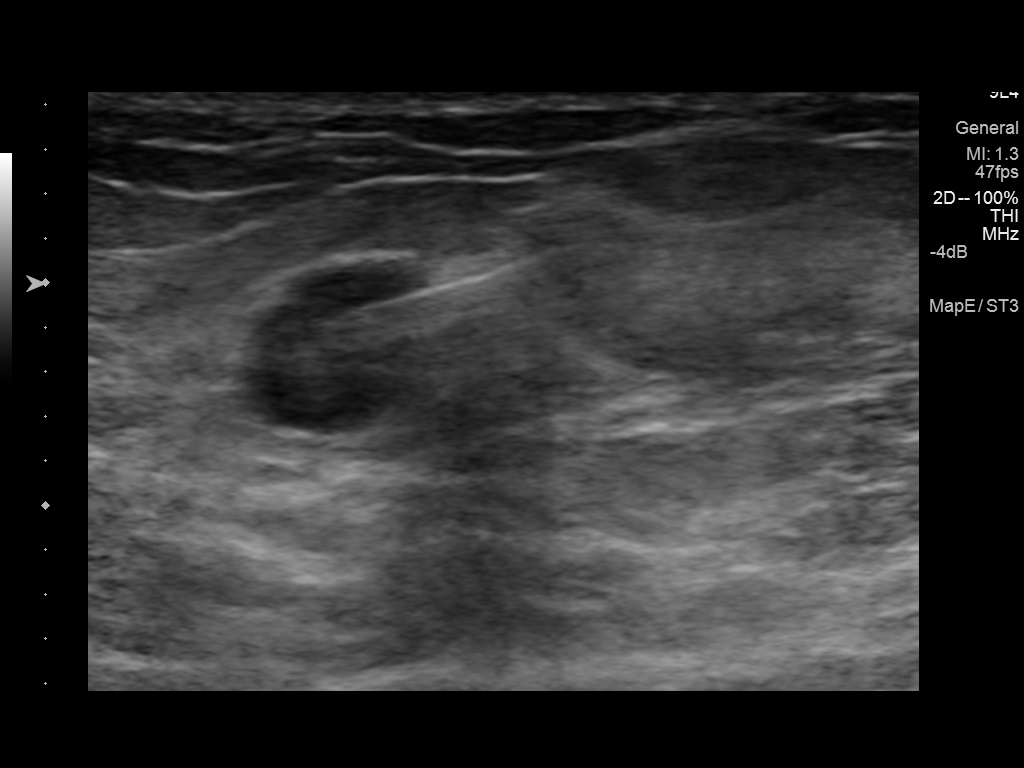
[im 6/7]
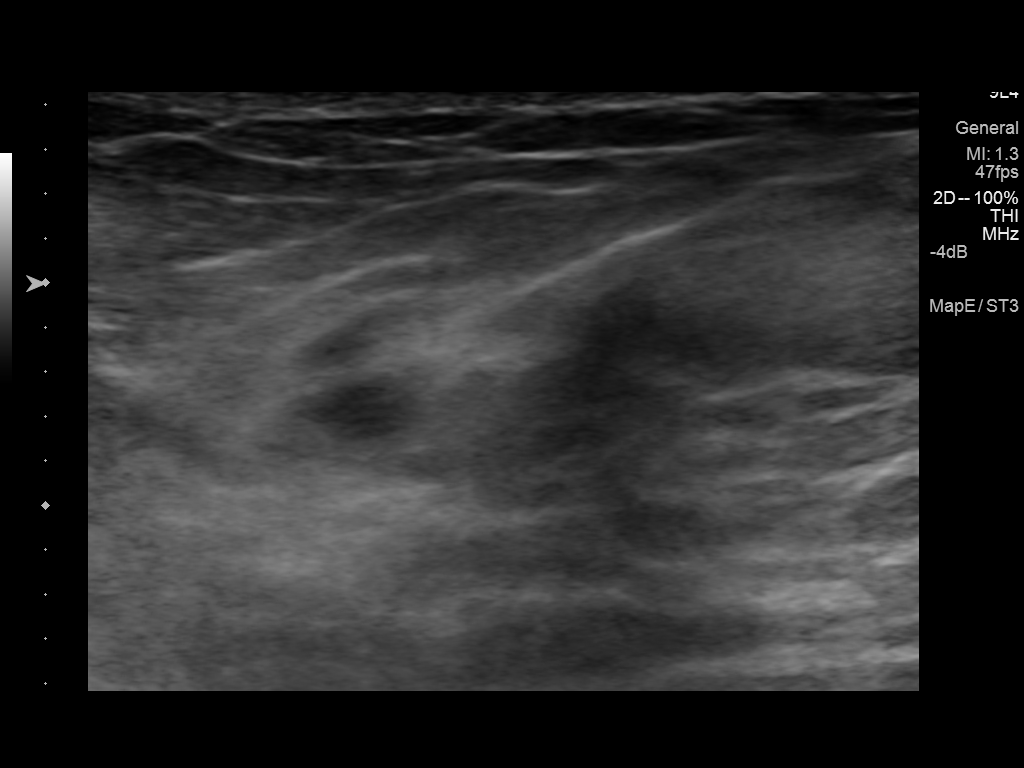
[im 7/7]
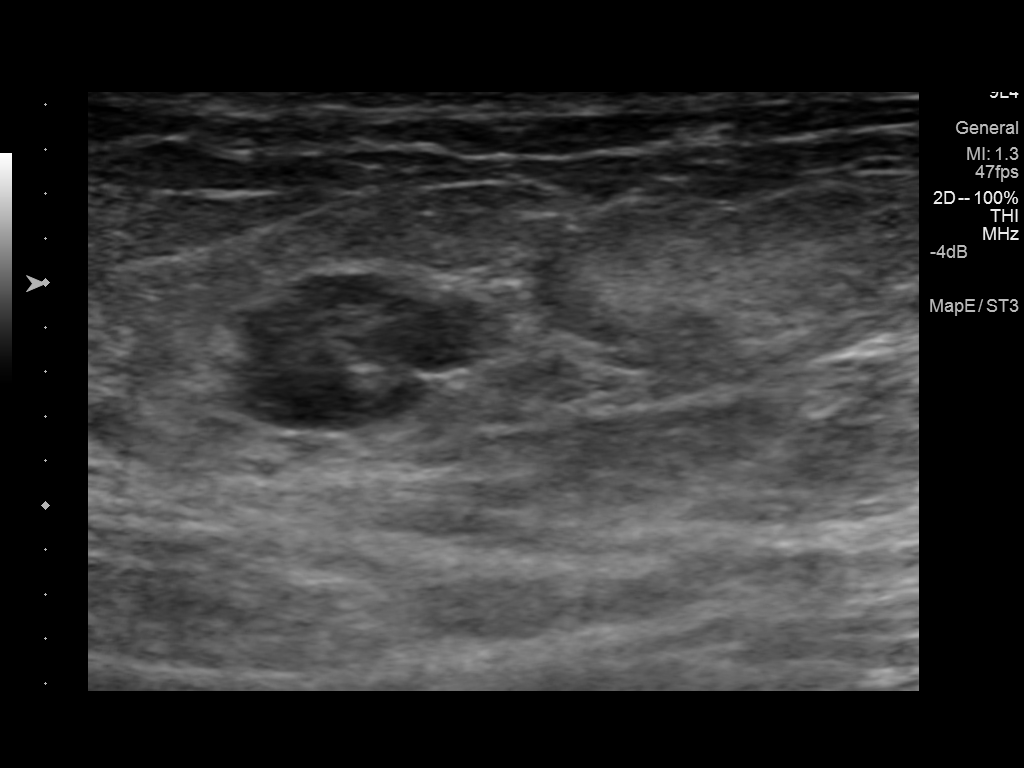

[7 of 7 positions shown; findings below may reference images not displayed]

EXAM:
ULTRASOUND GUIDED BREAST CORE NEEDLE BIOPSY (RIGHT )

FLUOROSCOPY TIME:  None

MEDICATIONS:
None

ANESTHESIA/SEDATION:
Moderate sedation time: None

PROCEDURE:
Informed consent was obtained for a right breast nodule biopsy. An
irregular shaped nodule in the right breast was identified at the 12
o'clock position. This corresponds with the recent diagnostic
ultrasound. The breast was prepped with Betadine and a sterile field
was created. 1% lidocaine is used for local anesthetic. A total of 5
core biopsies were obtained with an 18 gauge device using ultrasound
guidance. Needle position was confirmed within the lesion. Bandage
was placed at the puncture site. Specimens placed in formalin.
FINDINGS: Hypoechoic nodule in the right breast at the 12 o'clock position.
Findings correspond with the recent diagnostic ultrasound.

COMPLICATIONS:
None
IMPRESSION: Ultrasound-guided core biopsies of the right breast nodule.

## 2014-10-06 IMAGING — CR DG CHEST 1V PORT
1 series · 1 of 1 positions shown · non-contrast
Comparison: Chest x-ray 10/01/2013.

CLINICAL DATA: Evaluate endotracheal tube placement.

EXAM:
PORTABLE CHEST - 1 VIEW

[AP]
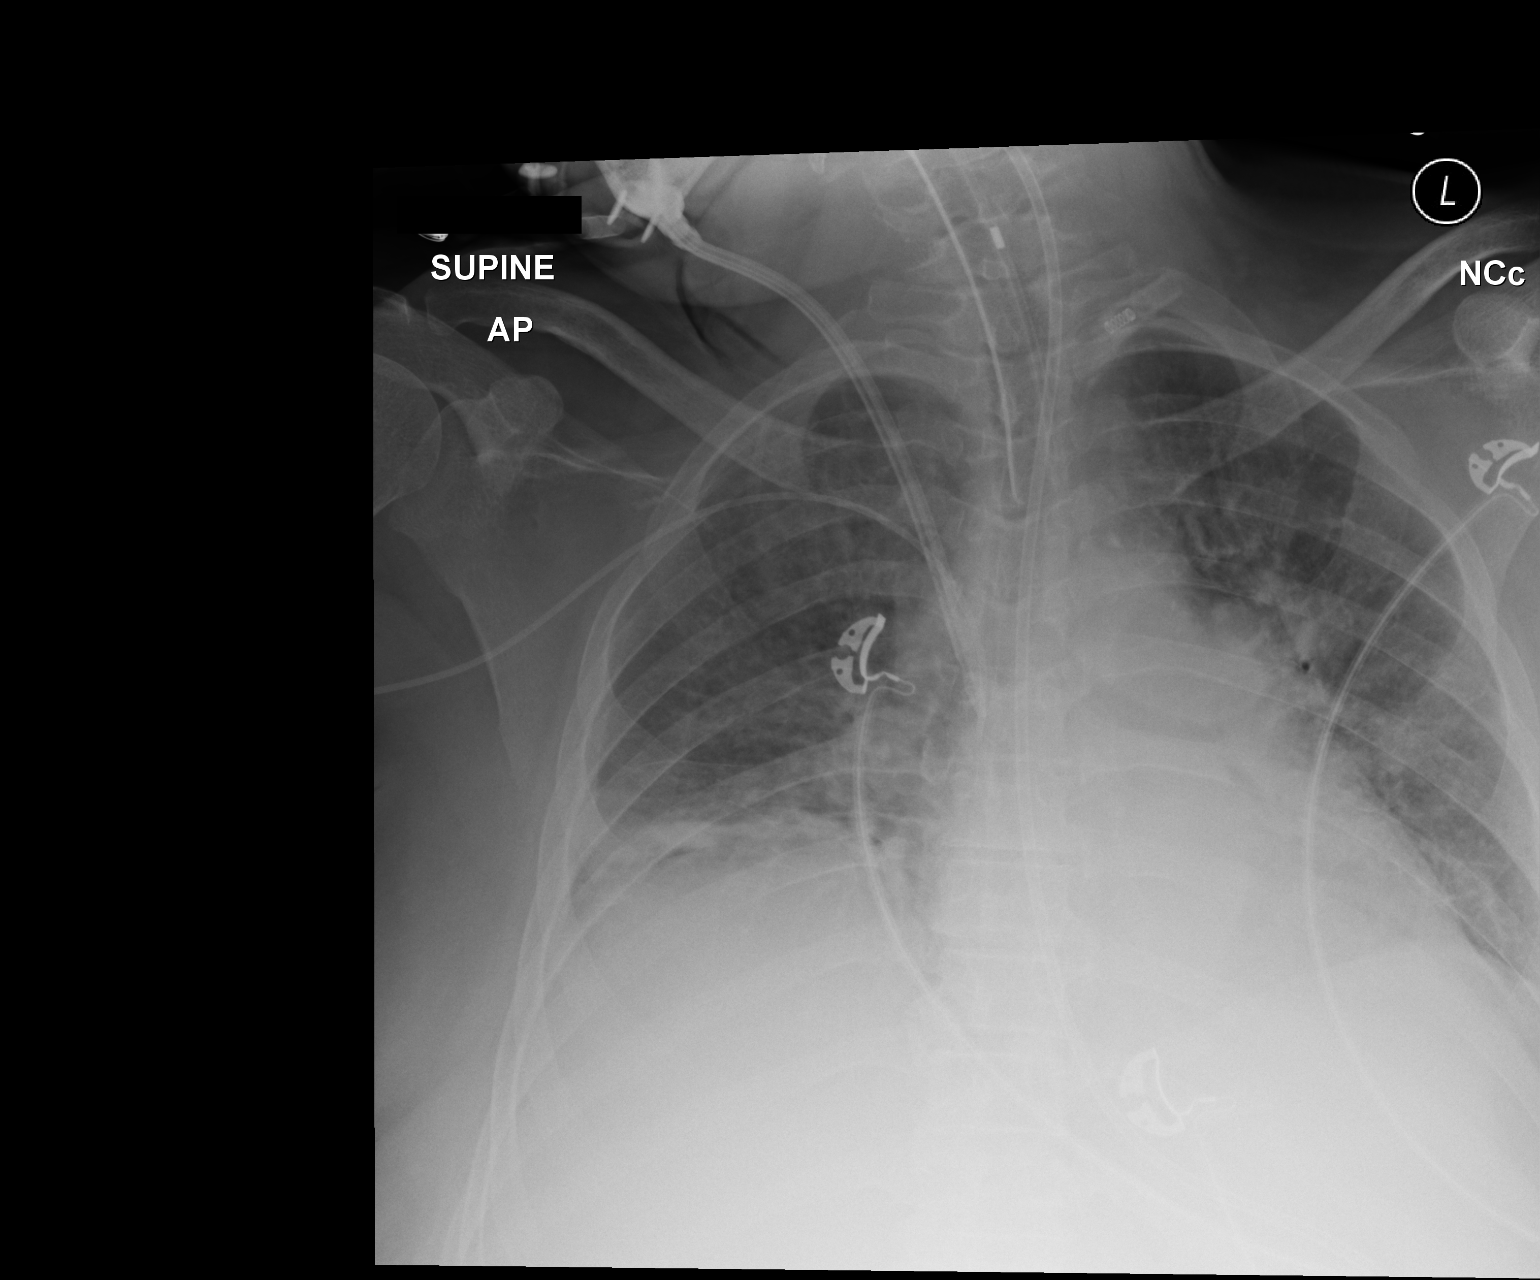

[1 of 1 positions shown; findings below may reference images not displayed]

FINDINGS: An endotracheal tube is in place with tip 4.4 cm above the carina.
Right internal jugular Vas-Cath with tip terminating in the distal
superior vena cava. There is a right upper extremity PICC with tip
terminating in the distal superior vena cava. A feeding tube is seen
extending into the abdomen, however, the tip of the feeding tube
extends below the lower margin of the image. Lung volumes are low.
Bibasilar opacities may reflect areas of atelectasis and/or
consolidation. Crowding of the pulmonary vasculature, accentuated by
low lung volumes. Probable trace bilateral pleural effusions. Heart
size is borderline enlarged. Upper mediastinal contours are
distorted by patient positioning. Atherosclerosis in the thoracic
aorta.
IMPRESSION: 1. Support apparatus, as above.
2. Low lung volumes with bibasilar atelectasis and/or consolidation
and superimposed trace bilateral pleural effusions.
3. Pulmonary venous congestion, without frank pulmonary edema.

## 2017-10-06 ENCOUNTER — Telehealth: Payer: Self-pay | Admitting: Neurology
